# Patient Record
Sex: Female | Born: 1945 | Race: White | Hispanic: No | Smoking: Current every day smoker
Health system: Southern US, Community
[De-identification: ages and names within clinical notes are randomized; demographics above are authoritative.]

## PROBLEM LIST (undated history)

## (undated) DIAGNOSIS — M62838 Other muscle spasm: Secondary | ICD-10-CM

## (undated) DIAGNOSIS — M51369 Other intervertebral disc degeneration, lumbar region without mention of lumbar back pain or lower extremity pain: Secondary | ICD-10-CM

## (undated) DIAGNOSIS — E785 Hyperlipidemia, unspecified: Secondary | ICD-10-CM

## (undated) DIAGNOSIS — M25561 Pain in right knee: Secondary | ICD-10-CM

## (undated) DIAGNOSIS — R569 Unspecified convulsions: Secondary | ICD-10-CM

## (undated) DIAGNOSIS — F319 Bipolar disorder, unspecified: Secondary | ICD-10-CM

## (undated) DIAGNOSIS — M5030 Other cervical disc degeneration, unspecified cervical region: Secondary | ICD-10-CM

## (undated) DIAGNOSIS — J449 Chronic obstructive pulmonary disease, unspecified: Secondary | ICD-10-CM

## (undated) DIAGNOSIS — N3020 Other chronic cystitis without hematuria: Secondary | ICD-10-CM

## (undated) DIAGNOSIS — K623 Rectal prolapse: Secondary | ICD-10-CM

## (undated) DIAGNOSIS — M797 Fibromyalgia: Secondary | ICD-10-CM

## (undated) DIAGNOSIS — Z720 Tobacco use: Secondary | ICD-10-CM

## (undated) DIAGNOSIS — C4490 Unspecified malignant neoplasm of skin, unspecified: Secondary | ICD-10-CM

## (undated) DIAGNOSIS — N3946 Mixed incontinence: Secondary | ICD-10-CM

## (undated) DIAGNOSIS — F419 Anxiety disorder, unspecified: Secondary | ICD-10-CM

## (undated) DIAGNOSIS — E669 Obesity, unspecified: Secondary | ICD-10-CM

## (undated) DIAGNOSIS — E119 Type 2 diabetes mellitus without complications: Secondary | ICD-10-CM

## (undated) DIAGNOSIS — L981 Factitial dermatitis: Secondary | ICD-10-CM

## (undated) DIAGNOSIS — K3184 Gastroparesis: Secondary | ICD-10-CM

## (undated) DIAGNOSIS — M5136 Other intervertebral disc degeneration, lumbar region: Secondary | ICD-10-CM

## (undated) DIAGNOSIS — I2510 Atherosclerotic heart disease of native coronary artery without angina pectoris: Secondary | ICD-10-CM

## (undated) DIAGNOSIS — G8929 Other chronic pain: Secondary | ICD-10-CM

## (undated) DIAGNOSIS — I10 Essential (primary) hypertension: Secondary | ICD-10-CM

## (undated) DIAGNOSIS — K5750 Diverticulosis of both small and large intestine without perforation or abscess without bleeding: Secondary | ICD-10-CM

## (undated) DIAGNOSIS — K219 Gastro-esophageal reflux disease without esophagitis: Secondary | ICD-10-CM

## (undated) DIAGNOSIS — D72829 Elevated white blood cell count, unspecified: Secondary | ICD-10-CM

## (undated) HISTORY — PX: ABDOMINAL HYSTERECTOMY: SHX81

## (undated) HISTORY — DX: Pain in right knee: M25.561

## (undated) HISTORY — DX: Anxiety disorder, unspecified: F41.9

## (undated) HISTORY — DX: Tobacco use: Z72.0

## (undated) HISTORY — DX: Essential (primary) hypertension: I10

## (undated) HISTORY — PX: APPENDECTOMY: SHX54

## (undated) HISTORY — DX: Unspecified malignant neoplasm of skin, unspecified: C44.90

## (undated) HISTORY — DX: Factitial dermatitis: L98.1

## (undated) HISTORY — DX: Gastro-esophageal reflux disease without esophagitis: K21.9

## (undated) HISTORY — PX: EYE SURGERY: SHX253

## (undated) HISTORY — DX: Rectal prolapse: K62.3

## (undated) HISTORY — DX: Unspecified convulsions: R56.9

## (undated) HISTORY — PX: ANTERIOR CERVICAL DECOMP/DISCECTOMY FUSION: SHX1161

## (undated) HISTORY — PX: OTHER SURGICAL HISTORY: SHX169

## (undated) HISTORY — DX: Other chronic cystitis without hematuria: N30.20

## (undated) HISTORY — DX: Atherosclerotic heart disease of native coronary artery without angina pectoris: I25.10

## (undated) HISTORY — PX: CHOLECYSTECTOMY: SHX55

## (undated) HISTORY — PX: BLADDER SURGERY: SHX569

## (undated) HISTORY — DX: Other muscle spasm: M62.838

## (undated) HISTORY — PX: RECTOCELE REPAIR: SHX761

## (undated) HISTORY — DX: Gastroparesis: K31.84

## (undated) HISTORY — DX: Other intervertebral disc degeneration, lumbar region: M51.36

## (undated) HISTORY — DX: Fibromyalgia: M79.7

## (undated) HISTORY — PX: TUBAL LIGATION: SHX77

## (undated) HISTORY — DX: Elevated white blood cell count, unspecified: D72.829

## (undated) HISTORY — DX: Chronic obstructive pulmonary disease, unspecified: J44.9

## (undated) HISTORY — DX: Diverticulosis of both small and large intestine without perforation or abscess without bleeding: K57.50

## (undated) HISTORY — PX: CARDIOVASCULAR STRESS TEST: SHX262

## (undated) HISTORY — DX: Obesity, unspecified: E66.9

## (undated) HISTORY — DX: Hyperlipidemia, unspecified: E78.5

## (undated) HISTORY — DX: Other chronic pain: G89.29

## (undated) HISTORY — DX: Type 2 diabetes mellitus without complications: E11.9

## (undated) HISTORY — DX: Bipolar disorder, unspecified: F31.9

## (undated) HISTORY — DX: Other cervical disc degeneration, unspecified cervical region: M50.30

## (undated) HISTORY — DX: Mixed incontinence: N39.46

## (undated) HISTORY — DX: Other intervertebral disc degeneration, lumbar region without mention of lumbar back pain or lower extremity pain: M51.369

---

## 1998-10-31 ENCOUNTER — Other Ambulatory Visit: Admission: RE | Admit: 1998-10-31 | Discharge: 1998-10-31 | Payer: Self-pay | Admitting: Gastroenterology

## 1998-11-29 ENCOUNTER — Encounter: Payer: Self-pay | Admitting: Gastroenterology

## 1998-11-29 ENCOUNTER — Ambulatory Visit (HOSPITAL_COMMUNITY): Admission: RE | Admit: 1998-11-29 | Discharge: 1998-11-29 | Payer: Self-pay | Admitting: Gastroenterology

## 2001-04-06 ENCOUNTER — Encounter: Payer: Self-pay | Admitting: Internal Medicine

## 2001-04-06 ENCOUNTER — Ambulatory Visit (HOSPITAL_COMMUNITY): Admission: RE | Admit: 2001-04-06 | Discharge: 2001-04-06 | Payer: Self-pay | Admitting: Internal Medicine

## 2001-05-22 ENCOUNTER — Ambulatory Visit (HOSPITAL_COMMUNITY): Admission: RE | Admit: 2001-05-22 | Discharge: 2001-05-22 | Payer: Self-pay | Admitting: Cardiology

## 2001-05-24 ENCOUNTER — Emergency Department (HOSPITAL_COMMUNITY): Admission: EM | Admit: 2001-05-24 | Discharge: 2001-05-25 | Payer: Self-pay | Admitting: *Deleted

## 2001-06-08 ENCOUNTER — Encounter: Payer: Self-pay | Admitting: Otolaryngology

## 2001-06-08 ENCOUNTER — Ambulatory Visit (HOSPITAL_COMMUNITY): Admission: RE | Admit: 2001-06-08 | Discharge: 2001-06-08 | Payer: Self-pay | Admitting: Otolaryngology

## 2001-07-24 ENCOUNTER — Emergency Department (HOSPITAL_COMMUNITY): Admission: EM | Admit: 2001-07-24 | Discharge: 2001-07-24 | Payer: Self-pay | Admitting: Emergency Medicine

## 2002-05-25 ENCOUNTER — Encounter: Payer: Self-pay | Admitting: *Deleted

## 2002-05-25 ENCOUNTER — Ambulatory Visit (HOSPITAL_COMMUNITY): Admission: RE | Admit: 2002-05-25 | Discharge: 2002-05-25 | Payer: Self-pay | Admitting: *Deleted

## 2002-06-14 ENCOUNTER — Encounter: Payer: Self-pay | Admitting: Otolaryngology

## 2002-06-14 ENCOUNTER — Ambulatory Visit (HOSPITAL_COMMUNITY): Admission: RE | Admit: 2002-06-14 | Discharge: 2002-06-14 | Payer: Self-pay | Admitting: Otolaryngology

## 2002-09-02 HISTORY — PX: COLONOSCOPY W/ BIOPSIES: SHX1374

## 2002-09-06 ENCOUNTER — Other Ambulatory Visit: Admission: RE | Admit: 2002-09-06 | Discharge: 2002-09-06 | Payer: Self-pay | Admitting: Obstetrics and Gynecology

## 2002-09-09 ENCOUNTER — Encounter: Payer: Self-pay | Admitting: Gastroenterology

## 2002-10-13 ENCOUNTER — Encounter: Payer: Self-pay | Admitting: Internal Medicine

## 2002-10-13 ENCOUNTER — Ambulatory Visit (HOSPITAL_COMMUNITY): Admission: RE | Admit: 2002-10-13 | Discharge: 2002-10-13 | Payer: Self-pay | Admitting: Internal Medicine

## 2002-10-20 ENCOUNTER — Ambulatory Visit (HOSPITAL_COMMUNITY): Admission: RE | Admit: 2002-10-20 | Discharge: 2002-10-20 | Payer: Self-pay | Admitting: Internal Medicine

## 2002-10-20 ENCOUNTER — Encounter: Payer: Self-pay | Admitting: Internal Medicine

## 2003-01-17 ENCOUNTER — Emergency Department (HOSPITAL_COMMUNITY): Admission: EM | Admit: 2003-01-17 | Discharge: 2003-01-17 | Payer: Self-pay | Admitting: Emergency Medicine

## 2003-05-12 ENCOUNTER — Ambulatory Visit (HOSPITAL_COMMUNITY): Admission: RE | Admit: 2003-05-12 | Discharge: 2003-05-12 | Payer: Self-pay | Admitting: Internal Medicine

## 2003-05-12 ENCOUNTER — Encounter: Payer: Self-pay | Admitting: Internal Medicine

## 2003-06-01 ENCOUNTER — Inpatient Hospital Stay (HOSPITAL_COMMUNITY): Admission: EM | Admit: 2003-06-01 | Discharge: 2003-06-05 | Payer: Self-pay | Admitting: Psychiatry

## 2003-06-13 ENCOUNTER — Emergency Department (HOSPITAL_COMMUNITY): Admission: EM | Admit: 2003-06-13 | Discharge: 2003-06-13 | Payer: Self-pay | Admitting: Emergency Medicine

## 2003-06-30 ENCOUNTER — Emergency Department (HOSPITAL_COMMUNITY): Admission: EM | Admit: 2003-06-30 | Discharge: 2003-06-30 | Payer: Self-pay | Admitting: Emergency Medicine

## 2004-01-05 ENCOUNTER — Emergency Department (HOSPITAL_COMMUNITY): Admission: EM | Admit: 2004-01-05 | Discharge: 2004-01-05 | Payer: Self-pay | Admitting: Emergency Medicine

## 2004-01-20 ENCOUNTER — Emergency Department (HOSPITAL_COMMUNITY): Admission: EM | Admit: 2004-01-20 | Discharge: 2004-01-21 | Payer: Self-pay | Admitting: Emergency Medicine

## 2004-08-21 ENCOUNTER — Ambulatory Visit: Payer: Self-pay | Admitting: Psychiatry

## 2004-08-21 ENCOUNTER — Inpatient Hospital Stay (HOSPITAL_COMMUNITY): Admission: EM | Admit: 2004-08-21 | Discharge: 2004-08-31 | Payer: Self-pay | Admitting: Psychiatry

## 2004-09-07 ENCOUNTER — Ambulatory Visit: Payer: Self-pay | Admitting: Family Medicine

## 2004-09-10 ENCOUNTER — Ambulatory Visit: Payer: Self-pay | Admitting: Family Medicine

## 2004-09-11 ENCOUNTER — Ambulatory Visit: Payer: Self-pay | Admitting: Family Medicine

## 2004-09-21 ENCOUNTER — Ambulatory Visit: Payer: Self-pay | Admitting: Family Medicine

## 2004-10-03 ENCOUNTER — Ambulatory Visit: Payer: Self-pay | Admitting: Family Medicine

## 2004-10-04 ENCOUNTER — Ambulatory Visit: Payer: Self-pay | Admitting: Family Medicine

## 2004-10-12 ENCOUNTER — Ambulatory Visit: Payer: Self-pay | Admitting: Family Medicine

## 2004-10-28 ENCOUNTER — Emergency Department (HOSPITAL_COMMUNITY): Admission: EM | Admit: 2004-10-28 | Discharge: 2004-10-28 | Payer: Self-pay | Admitting: Emergency Medicine

## 2004-11-07 ENCOUNTER — Emergency Department (HOSPITAL_COMMUNITY): Admission: EM | Admit: 2004-11-07 | Discharge: 2004-11-07 | Payer: Self-pay | Admitting: Family Medicine

## 2004-11-16 ENCOUNTER — Ambulatory Visit: Payer: Self-pay | Admitting: Family Medicine

## 2004-12-04 ENCOUNTER — Encounter: Admission: RE | Admit: 2004-12-04 | Discharge: 2004-12-04 | Payer: Self-pay | Admitting: Family Medicine

## 2005-01-19 ENCOUNTER — Inpatient Hospital Stay (HOSPITAL_COMMUNITY): Admission: EM | Admit: 2005-01-19 | Discharge: 2005-01-22 | Payer: Self-pay | Admitting: Emergency Medicine

## 2005-01-20 ENCOUNTER — Ambulatory Visit: Payer: Self-pay | Admitting: Gastroenterology

## 2005-02-03 ENCOUNTER — Emergency Department (HOSPITAL_COMMUNITY): Admission: EM | Admit: 2005-02-03 | Discharge: 2005-02-03 | Payer: Self-pay | Admitting: Emergency Medicine

## 2005-03-25 ENCOUNTER — Emergency Department (HOSPITAL_COMMUNITY): Admission: EM | Admit: 2005-03-25 | Discharge: 2005-03-26 | Payer: Self-pay | Admitting: Emergency Medicine

## 2005-04-05 ENCOUNTER — Ambulatory Visit: Payer: Self-pay | Admitting: Gastroenterology

## 2005-05-31 ENCOUNTER — Other Ambulatory Visit: Admission: RE | Admit: 2005-05-31 | Discharge: 2005-05-31 | Payer: Self-pay | Admitting: Obstetrics and Gynecology

## 2005-06-10 ENCOUNTER — Inpatient Hospital Stay (HOSPITAL_COMMUNITY): Admission: RE | Admit: 2005-06-10 | Discharge: 2005-06-12 | Payer: Self-pay | Admitting: Specialist

## 2005-08-07 ENCOUNTER — Ambulatory Visit: Payer: Self-pay | Admitting: Internal Medicine

## 2005-08-21 ENCOUNTER — Ambulatory Visit: Payer: Self-pay | Admitting: Internal Medicine

## 2005-08-30 ENCOUNTER — Encounter: Payer: Self-pay | Admitting: Internal Medicine

## 2005-10-02 ENCOUNTER — Ambulatory Visit: Payer: Self-pay | Admitting: Internal Medicine

## 2005-10-30 ENCOUNTER — Ambulatory Visit: Payer: Self-pay | Admitting: Internal Medicine

## 2005-11-06 ENCOUNTER — Ambulatory Visit: Payer: Self-pay | Admitting: Internal Medicine

## 2005-11-29 ENCOUNTER — Ambulatory Visit: Payer: Self-pay | Admitting: Internal Medicine

## 2005-12-30 ENCOUNTER — Ambulatory Visit: Payer: Self-pay | Admitting: Internal Medicine

## 2006-01-10 ENCOUNTER — Encounter: Admission: RE | Admit: 2006-01-10 | Discharge: 2006-01-10 | Payer: Self-pay | Admitting: Internal Medicine

## 2006-03-04 ENCOUNTER — Encounter: Admission: RE | Admit: 2006-03-04 | Discharge: 2006-03-04 | Payer: Self-pay | Admitting: Obstetrics and Gynecology

## 2006-05-19 ENCOUNTER — Ambulatory Visit: Payer: Self-pay | Admitting: Internal Medicine

## 2006-06-19 ENCOUNTER — Ambulatory Visit: Payer: Self-pay | Admitting: Internal Medicine

## 2006-10-06 ENCOUNTER — Ambulatory Visit: Payer: Self-pay | Admitting: Internal Medicine

## 2006-10-27 ENCOUNTER — Ambulatory Visit: Payer: Self-pay | Admitting: Internal Medicine

## 2006-10-31 ENCOUNTER — Ambulatory Visit: Payer: Self-pay | Admitting: Internal Medicine

## 2006-10-31 LAB — CONVERTED CEMR LAB
ALT: 15 units/L (ref 0–40)
Direct LDL: 133.1 mg/dL
Triglycerides: 214 mg/dL (ref 0–149)

## 2006-11-05 ENCOUNTER — Ambulatory Visit: Payer: Self-pay | Admitting: Cardiovascular Disease

## 2006-11-13 ENCOUNTER — Ambulatory Visit: Payer: Self-pay

## 2006-12-01 ENCOUNTER — Ambulatory Visit: Payer: Self-pay | Admitting: Internal Medicine

## 2006-12-24 ENCOUNTER — Ambulatory Visit: Payer: Self-pay | Admitting: Cardiovascular Disease

## 2007-01-08 ENCOUNTER — Ambulatory Visit: Payer: Self-pay | Admitting: Internal Medicine

## 2007-01-12 ENCOUNTER — Encounter: Admission: RE | Admit: 2007-01-12 | Discharge: 2007-01-12 | Payer: Self-pay | Admitting: Internal Medicine

## 2007-02-27 ENCOUNTER — Ambulatory Visit: Payer: Self-pay | Admitting: Internal Medicine

## 2007-04-03 ENCOUNTER — Encounter: Payer: Self-pay | Admitting: Internal Medicine

## 2007-04-03 DIAGNOSIS — F17200 Nicotine dependence, unspecified, uncomplicated: Secondary | ICD-10-CM | POA: Insufficient documentation

## 2007-04-03 DIAGNOSIS — R569 Unspecified convulsions: Secondary | ICD-10-CM

## 2007-04-03 DIAGNOSIS — I10 Essential (primary) hypertension: Secondary | ICD-10-CM | POA: Insufficient documentation

## 2007-04-03 DIAGNOSIS — J309 Allergic rhinitis, unspecified: Secondary | ICD-10-CM | POA: Insufficient documentation

## 2007-04-03 DIAGNOSIS — F329 Major depressive disorder, single episode, unspecified: Secondary | ICD-10-CM | POA: Insufficient documentation

## 2007-04-03 DIAGNOSIS — Z85828 Personal history of other malignant neoplasm of skin: Secondary | ICD-10-CM

## 2007-04-03 DIAGNOSIS — J45909 Unspecified asthma, uncomplicated: Secondary | ICD-10-CM | POA: Insufficient documentation

## 2007-04-03 DIAGNOSIS — K219 Gastro-esophageal reflux disease without esophagitis: Secondary | ICD-10-CM | POA: Insufficient documentation

## 2007-04-14 ENCOUNTER — Ambulatory Visit: Payer: Self-pay | Admitting: Internal Medicine

## 2007-04-24 ENCOUNTER — Ambulatory Visit: Payer: Self-pay | Admitting: Internal Medicine

## 2007-05-11 ENCOUNTER — Ambulatory Visit: Payer: Self-pay | Admitting: Internal Medicine

## 2007-05-11 LAB — CONVERTED CEMR LAB
Crystals: NEGATIVE
Hemoglobin, Urine: NEGATIVE
Ketones, ur: NEGATIVE mg/dL
Specific Gravity, Urine: <=1.005 (ref 1.000–1.03)
Total Protein, Urine: NEGATIVE mg/dL
pH: 7 (ref 5.0–8.0)

## 2007-05-12 ENCOUNTER — Encounter: Payer: Self-pay | Admitting: Internal Medicine

## 2007-05-12 LAB — CONVERTED CEMR LAB
Amphetamine Screen, Ur: NEGATIVE
Barbiturate Quant, Ur: NEGATIVE
Benzodiazepines.: NEGATIVE
Methadone: NEGATIVE
Propoxyphene: NEGATIVE

## 2007-06-08 ENCOUNTER — Ambulatory Visit: Payer: Self-pay | Admitting: Gastroenterology

## 2007-06-08 LAB — CONVERTED CEMR LAB
AST: 19 units/L (ref 0–37)
Amylase: 53 units/L (ref 27–131)
Basophils Absolute: 0.1 10*3/uL (ref 0.0–0.1)
Bilirubin, Direct: 0.1 mg/dL (ref 0.0–0.3)
Eosinophils Absolute: 0.2 10*3/uL (ref 0.0–0.6)
HCT: 35.5 % — ABNORMAL LOW (ref 36.0–46.0)
Hemoglobin: 12.1 g/dL (ref 12.0–15.0)
Lymphocytes Relative: 34.2 % (ref 12.0–46.0)
MCHC: 34.2 g/dL (ref 30.0–36.0)
MCV: 82.3 fL (ref 78.0–100.0)
Monocytes Absolute: 0.7 10*3/uL (ref 0.2–0.7)
Neutrophils Relative %: 57.2 % (ref 43.0–77.0)

## 2007-07-01 ENCOUNTER — Telehealth (INDEPENDENT_AMBULATORY_CARE_PROVIDER_SITE_OTHER): Payer: Self-pay | Admitting: *Deleted

## 2007-07-02 ENCOUNTER — Emergency Department (HOSPITAL_COMMUNITY): Admission: EM | Admit: 2007-07-02 | Discharge: 2007-07-02 | Payer: Self-pay | Admitting: Emergency Medicine

## 2007-07-06 ENCOUNTER — Ambulatory Visit: Payer: Self-pay | Admitting: Internal Medicine

## 2007-07-06 DIAGNOSIS — K589 Irritable bowel syndrome without diarrhea: Secondary | ICD-10-CM

## 2007-07-06 DIAGNOSIS — E871 Hypo-osmolality and hyponatremia: Secondary | ICD-10-CM | POA: Insufficient documentation

## 2007-07-06 DIAGNOSIS — IMO0001 Reserved for inherently not codable concepts without codable children: Secondary | ICD-10-CM

## 2007-07-07 ENCOUNTER — Telehealth: Payer: Self-pay | Admitting: Internal Medicine

## 2007-07-31 ENCOUNTER — Encounter: Payer: Self-pay | Admitting: Internal Medicine

## 2007-08-03 HISTORY — PX: CARDIAC CATHETERIZATION: SHX172

## 2007-08-07 ENCOUNTER — Ambulatory Visit: Payer: Self-pay | Admitting: Internal Medicine

## 2007-08-08 LAB — CONVERTED CEMR LAB
BUN: 4 mg/dL — ABNORMAL LOW (ref 6–23)
Calcium: 9.5 mg/dL (ref 8.4–10.5)
GFR calc Af Amer: 132 mL/min
GFR calc non Af Amer: 109 mL/min
Glucose, Bld: 92 mg/dL (ref 70–99)

## 2007-08-12 ENCOUNTER — Ambulatory Visit: Payer: Self-pay | Admitting: Pediatrics

## 2007-08-13 ENCOUNTER — Telehealth: Payer: Self-pay | Admitting: Internal Medicine

## 2007-08-13 DIAGNOSIS — M545 Low back pain: Secondary | ICD-10-CM

## 2007-08-14 ENCOUNTER — Encounter
Admission: RE | Admit: 2007-08-14 | Discharge: 2007-09-01 | Payer: Self-pay | Admitting: Physical Medicine & Rehabilitation

## 2007-08-14 ENCOUNTER — Ambulatory Visit: Payer: Self-pay | Admitting: Physical Medicine & Rehabilitation

## 2007-08-18 ENCOUNTER — Ambulatory Visit: Payer: Self-pay | Admitting: Cardiovascular Disease

## 2007-08-18 ENCOUNTER — Encounter
Admission: RE | Admit: 2007-08-18 | Discharge: 2007-08-18 | Payer: Self-pay | Admitting: Physical Medicine & Rehabilitation

## 2007-08-21 ENCOUNTER — Ambulatory Visit: Payer: Self-pay | Admitting: Cardiovascular Disease

## 2007-08-21 ENCOUNTER — Inpatient Hospital Stay (HOSPITAL_BASED_OUTPATIENT_CLINIC_OR_DEPARTMENT_OTHER): Admission: RE | Admit: 2007-08-21 | Discharge: 2007-08-21 | Payer: Self-pay | Admitting: Cardiovascular Disease

## 2007-08-21 ENCOUNTER — Encounter: Payer: Self-pay | Admitting: Internal Medicine

## 2007-08-26 ENCOUNTER — Encounter: Payer: Self-pay | Admitting: Internal Medicine

## 2007-08-31 ENCOUNTER — Telehealth: Payer: Self-pay | Admitting: Internal Medicine

## 2007-08-31 ENCOUNTER — Emergency Department (HOSPITAL_COMMUNITY): Admission: EM | Admit: 2007-08-31 | Discharge: 2007-08-31 | Payer: Self-pay | Admitting: Emergency Medicine

## 2007-09-01 ENCOUNTER — Ambulatory Visit: Payer: Self-pay | Admitting: Internal Medicine

## 2007-09-01 DIAGNOSIS — I2510 Atherosclerotic heart disease of native coronary artery without angina pectoris: Secondary | ICD-10-CM | POA: Insufficient documentation

## 2007-09-07 ENCOUNTER — Encounter: Payer: Self-pay | Admitting: Internal Medicine

## 2007-09-09 ENCOUNTER — Ambulatory Visit: Payer: Self-pay | Admitting: Cardiovascular Disease

## 2007-09-10 ENCOUNTER — Ambulatory Visit: Payer: Self-pay | Admitting: Internal Medicine

## 2007-09-13 ENCOUNTER — Encounter: Payer: Self-pay | Admitting: Internal Medicine

## 2007-09-23 ENCOUNTER — Ambulatory Visit: Payer: Self-pay | Admitting: Physical Medicine & Rehabilitation

## 2007-09-23 ENCOUNTER — Encounter
Admission: RE | Admit: 2007-09-23 | Discharge: 2007-12-22 | Payer: Self-pay | Admitting: Physical Medicine & Rehabilitation

## 2007-09-30 ENCOUNTER — Ambulatory Visit: Payer: Self-pay | Admitting: Internal Medicine

## 2007-10-02 ENCOUNTER — Encounter: Payer: Self-pay | Admitting: Internal Medicine

## 2007-10-09 ENCOUNTER — Ambulatory Visit: Payer: Self-pay | Admitting: Internal Medicine

## 2007-10-09 ENCOUNTER — Encounter
Admission: RE | Admit: 2007-10-09 | Discharge: 2007-10-09 | Payer: Self-pay | Admitting: Physical Medicine & Rehabilitation

## 2007-10-09 ENCOUNTER — Ambulatory Visit: Payer: Self-pay | Admitting: Physical Medicine & Rehabilitation

## 2007-10-09 LAB — CONVERTED CEMR LAB
Bilirubin Urine: NEGATIVE
Ketones, ur: NEGATIVE mg/dL
Total Protein, Urine: NEGATIVE mg/dL
Urine Glucose: NEGATIVE mg/dL
Urobilinogen, UA: 0.2 (ref 0.0–1.0)
pH: 6 (ref 5.0–8.0)

## 2007-10-12 ENCOUNTER — Telehealth: Payer: Self-pay | Admitting: Internal Medicine

## 2007-10-19 ENCOUNTER — Ambulatory Visit: Payer: Self-pay | Admitting: Cardiovascular Disease

## 2007-10-19 LAB — CONVERTED CEMR LAB
ALT: 17 units/L (ref 0–35)
AST: 17 units/L (ref 0–37)
Albumin: 3.4 g/dL — ABNORMAL LOW (ref 3.5–5.2)
Alkaline Phosphatase: 89 units/L (ref 39–117)
VLDL: 29 mg/dL (ref 0–40)

## 2007-11-03 ENCOUNTER — Encounter: Admission: RE | Admit: 2007-11-03 | Discharge: 2007-11-03 | Payer: Self-pay | Admitting: Obstetrics and Gynecology

## 2007-11-12 DIAGNOSIS — K209 Esophagitis, unspecified without bleeding: Secondary | ICD-10-CM | POA: Insufficient documentation

## 2007-11-12 DIAGNOSIS — K5730 Diverticulosis of large intestine without perforation or abscess without bleeding: Secondary | ICD-10-CM | POA: Insufficient documentation

## 2007-11-12 DIAGNOSIS — E785 Hyperlipidemia, unspecified: Secondary | ICD-10-CM

## 2007-11-12 DIAGNOSIS — F341 Dysthymic disorder: Secondary | ICD-10-CM | POA: Insufficient documentation

## 2007-11-12 DIAGNOSIS — R197 Diarrhea, unspecified: Secondary | ICD-10-CM | POA: Insufficient documentation

## 2007-11-12 DIAGNOSIS — N816 Rectocele: Secondary | ICD-10-CM

## 2007-11-12 DIAGNOSIS — M129 Arthropathy, unspecified: Secondary | ICD-10-CM | POA: Insufficient documentation

## 2007-11-12 DIAGNOSIS — E669 Obesity, unspecified: Secondary | ICD-10-CM

## 2007-11-12 DIAGNOSIS — J329 Chronic sinusitis, unspecified: Secondary | ICD-10-CM | POA: Insufficient documentation

## 2007-11-12 DIAGNOSIS — F319 Bipolar disorder, unspecified: Secondary | ICD-10-CM

## 2007-11-12 DIAGNOSIS — Z8719 Personal history of other diseases of the digestive system: Secondary | ICD-10-CM

## 2007-11-19 ENCOUNTER — Ambulatory Visit: Payer: Self-pay | Admitting: Physical Medicine & Rehabilitation

## 2007-11-23 ENCOUNTER — Emergency Department (HOSPITAL_COMMUNITY): Admission: EM | Admit: 2007-11-23 | Discharge: 2007-11-24 | Payer: Self-pay | Admitting: Emergency Medicine

## 2007-11-24 ENCOUNTER — Encounter: Payer: Self-pay | Admitting: Internal Medicine

## 2007-12-08 ENCOUNTER — Encounter: Payer: Self-pay | Admitting: Internal Medicine

## 2007-12-10 ENCOUNTER — Ambulatory Visit: Payer: Self-pay | Admitting: Cardiovascular Disease

## 2007-12-17 ENCOUNTER — Telehealth: Payer: Self-pay | Admitting: Internal Medicine

## 2007-12-18 ENCOUNTER — Ambulatory Visit: Payer: Self-pay | Admitting: Physical Medicine & Rehabilitation

## 2008-01-13 ENCOUNTER — Encounter
Admission: RE | Admit: 2008-01-13 | Discharge: 2008-04-12 | Payer: Self-pay | Admitting: Physical Medicine & Rehabilitation

## 2008-01-18 ENCOUNTER — Ambulatory Visit: Payer: Self-pay | Admitting: Physical Medicine & Rehabilitation

## 2008-02-10 ENCOUNTER — Encounter: Admission: RE | Admit: 2008-02-10 | Discharge: 2008-02-10 | Payer: Self-pay | Admitting: Obstetrics and Gynecology

## 2008-02-16 ENCOUNTER — Ambulatory Visit: Payer: Self-pay | Admitting: Physical Medicine & Rehabilitation

## 2008-03-02 ENCOUNTER — Ambulatory Visit: Payer: Self-pay | Admitting: Internal Medicine

## 2008-03-02 DIAGNOSIS — R21 Rash and other nonspecific skin eruption: Secondary | ICD-10-CM | POA: Insufficient documentation

## 2008-03-08 ENCOUNTER — Telehealth (INDEPENDENT_AMBULATORY_CARE_PROVIDER_SITE_OTHER): Payer: Self-pay | Admitting: *Deleted

## 2008-03-11 ENCOUNTER — Encounter: Payer: Self-pay | Admitting: Internal Medicine

## 2008-03-15 ENCOUNTER — Ambulatory Visit: Payer: Self-pay | Admitting: Physical Medicine & Rehabilitation

## 2008-03-25 ENCOUNTER — Telehealth (INDEPENDENT_AMBULATORY_CARE_PROVIDER_SITE_OTHER): Payer: Self-pay | Admitting: *Deleted

## 2008-03-26 ENCOUNTER — Telehealth: Payer: Self-pay | Admitting: Family Medicine

## 2008-04-13 ENCOUNTER — Encounter
Admission: RE | Admit: 2008-04-13 | Discharge: 2008-07-12 | Payer: Self-pay | Admitting: Physical Medicine & Rehabilitation

## 2008-04-15 ENCOUNTER — Telehealth: Payer: Self-pay | Admitting: Internal Medicine

## 2008-04-15 ENCOUNTER — Ambulatory Visit: Payer: Self-pay | Admitting: Physical Medicine & Rehabilitation

## 2008-04-18 ENCOUNTER — Ambulatory Visit: Payer: Self-pay | Admitting: Internal Medicine

## 2008-04-18 ENCOUNTER — Telehealth (INDEPENDENT_AMBULATORY_CARE_PROVIDER_SITE_OTHER): Payer: Self-pay | Admitting: *Deleted

## 2008-04-18 DIAGNOSIS — M79609 Pain in unspecified limb: Secondary | ICD-10-CM

## 2008-04-18 DIAGNOSIS — M5412 Radiculopathy, cervical region: Secondary | ICD-10-CM | POA: Insufficient documentation

## 2008-04-18 DIAGNOSIS — R42 Dizziness and giddiness: Secondary | ICD-10-CM

## 2008-04-20 ENCOUNTER — Ambulatory Visit: Payer: Self-pay

## 2008-04-20 ENCOUNTER — Encounter: Payer: Self-pay | Admitting: Internal Medicine

## 2008-04-21 ENCOUNTER — Encounter: Admission: RE | Admit: 2008-04-21 | Discharge: 2008-04-21 | Payer: Self-pay | Admitting: Internal Medicine

## 2008-05-01 ENCOUNTER — Encounter: Admission: RE | Admit: 2008-05-01 | Discharge: 2008-05-01 | Payer: Self-pay | Admitting: Specialist

## 2008-05-02 ENCOUNTER — Telehealth (INDEPENDENT_AMBULATORY_CARE_PROVIDER_SITE_OTHER): Payer: Self-pay | Admitting: *Deleted

## 2008-05-05 ENCOUNTER — Ambulatory Visit: Payer: Self-pay | Admitting: Physical Medicine & Rehabilitation

## 2008-05-11 ENCOUNTER — Encounter: Admission: RE | Admit: 2008-05-11 | Discharge: 2008-05-11 | Payer: Self-pay | Admitting: Specialist

## 2008-05-13 ENCOUNTER — Encounter: Payer: Self-pay | Admitting: Internal Medicine

## 2008-05-27 ENCOUNTER — Ambulatory Visit (HOSPITAL_COMMUNITY): Admission: RE | Admit: 2008-05-27 | Discharge: 2008-05-27 | Payer: Self-pay | Admitting: Specialist

## 2008-06-02 ENCOUNTER — Ambulatory Visit: Payer: Self-pay | Admitting: Physical Medicine & Rehabilitation

## 2008-06-10 ENCOUNTER — Ambulatory Visit: Payer: Self-pay | Admitting: Internal Medicine

## 2008-06-12 ENCOUNTER — Telehealth: Payer: Self-pay | Admitting: Family Medicine

## 2008-06-13 ENCOUNTER — Ambulatory Visit: Payer: Self-pay | Admitting: Internal Medicine

## 2008-06-21 ENCOUNTER — Telehealth (INDEPENDENT_AMBULATORY_CARE_PROVIDER_SITE_OTHER): Payer: Self-pay | Admitting: *Deleted

## 2008-06-22 ENCOUNTER — Ambulatory Visit: Payer: Self-pay | Admitting: Internal Medicine

## 2008-06-22 DIAGNOSIS — J45901 Unspecified asthma with (acute) exacerbation: Secondary | ICD-10-CM | POA: Insufficient documentation

## 2008-06-29 ENCOUNTER — Telehealth: Payer: Self-pay | Admitting: Internal Medicine

## 2008-07-04 ENCOUNTER — Ambulatory Visit: Payer: Self-pay | Admitting: Internal Medicine

## 2008-07-05 ENCOUNTER — Ambulatory Visit: Payer: Self-pay | Admitting: Cardiovascular Disease

## 2008-07-07 ENCOUNTER — Ambulatory Visit: Payer: Self-pay | Admitting: Internal Medicine

## 2008-07-07 DIAGNOSIS — R0989 Other specified symptoms and signs involving the circulatory and respiratory systems: Secondary | ICD-10-CM | POA: Insufficient documentation

## 2008-07-07 DIAGNOSIS — R0609 Other forms of dyspnea: Secondary | ICD-10-CM

## 2008-07-11 ENCOUNTER — Encounter: Payer: Self-pay | Admitting: Internal Medicine

## 2008-07-20 ENCOUNTER — Ambulatory Visit: Payer: Self-pay | Admitting: Internal Medicine

## 2008-07-20 ENCOUNTER — Encounter
Admission: RE | Admit: 2008-07-20 | Discharge: 2008-10-18 | Payer: Self-pay | Admitting: Physical Medicine & Rehabilitation

## 2008-07-21 ENCOUNTER — Ambulatory Visit (HOSPITAL_COMMUNITY)
Admission: RE | Admit: 2008-07-21 | Discharge: 2008-07-21 | Payer: Self-pay | Admitting: Physical Medicine & Rehabilitation

## 2008-07-21 ENCOUNTER — Ambulatory Visit: Payer: Self-pay | Admitting: Physical Medicine & Rehabilitation

## 2008-08-05 ENCOUNTER — Encounter: Admission: RE | Admit: 2008-08-05 | Discharge: 2008-08-05 | Payer: Self-pay | Admitting: Neurology

## 2008-08-09 ENCOUNTER — Ambulatory Visit: Payer: Self-pay | Admitting: Internal Medicine

## 2008-08-16 ENCOUNTER — Telehealth (INDEPENDENT_AMBULATORY_CARE_PROVIDER_SITE_OTHER): Payer: Self-pay | Admitting: *Deleted

## 2008-08-17 ENCOUNTER — Ambulatory Visit: Payer: Self-pay | Admitting: Internal Medicine

## 2008-08-17 DIAGNOSIS — E119 Type 2 diabetes mellitus without complications: Secondary | ICD-10-CM

## 2008-08-17 DIAGNOSIS — E1149 Type 2 diabetes mellitus with other diabetic neurological complication: Secondary | ICD-10-CM

## 2008-08-17 HISTORY — DX: Type 2 diabetes mellitus without complications: E11.9

## 2008-08-17 LAB — CONVERTED CEMR LAB
AST: 19 units/L (ref 0–37)
Albumin: 3.5 g/dL (ref 3.5–5.2)
Alkaline Phosphatase: 81 units/L (ref 39–117)
BUN: 6 mg/dL (ref 6–23)
Bilirubin Urine: NEGATIVE
Bilirubin, Direct: 0.1 mg/dL (ref 0.0–0.3)
Chloride: 107 meq/L (ref 96–112)
Eosinophils Absolute: 0.2 10*3/uL (ref 0.0–0.7)
Eosinophils Relative: 2 % (ref 0.0–5.0)
GFR calc Af Amer: 110 mL/min
GFR calc non Af Amer: 91 mL/min
Hemoglobin, Urine: NEGATIVE
LDL Cholesterol: 75 mg/dL (ref 0–99)
MCV: 84.4 fL (ref 78.0–100.0)
Monocytes Relative: 5.1 % (ref 3.0–12.0)
Neutrophils Relative %: 53.1 % (ref 43.0–77.0)
Nitrite: NEGATIVE
Platelets: 302 10*3/uL (ref 150–400)
Potassium: 4 meq/L (ref 3.5–5.1)
RDW: 14.2 % (ref 11.5–14.6)
Sodium: 141 meq/L (ref 135–145)
TSH: 1.24 microintl units/mL (ref 0.35–5.50)
Urobilinogen, UA: 0.2 (ref 0.0–1.0)
VLDL: 26 mg/dL (ref 0–40)
WBC: 11.6 10*3/uL — ABNORMAL HIGH (ref 4.5–10.5)

## 2008-08-19 LAB — CONVERTED CEMR LAB: Vit D, 1,25-Dihydroxy: 34 (ref 30–89)

## 2008-08-23 ENCOUNTER — Encounter: Payer: Self-pay | Admitting: Internal Medicine

## 2008-09-07 ENCOUNTER — Telehealth (INDEPENDENT_AMBULATORY_CARE_PROVIDER_SITE_OTHER): Payer: Self-pay | Admitting: *Deleted

## 2008-09-14 ENCOUNTER — Encounter: Payer: Self-pay | Admitting: Internal Medicine

## 2008-09-15 ENCOUNTER — Ambulatory Visit: Payer: Self-pay | Admitting: Physical Medicine & Rehabilitation

## 2008-10-03 ENCOUNTER — Ambulatory Visit: Payer: Self-pay | Admitting: Internal Medicine

## 2008-10-04 ENCOUNTER — Telehealth (INDEPENDENT_AMBULATORY_CARE_PROVIDER_SITE_OTHER): Payer: Self-pay | Admitting: *Deleted

## 2008-10-14 ENCOUNTER — Ambulatory Visit: Payer: Self-pay | Admitting: Physical Medicine & Rehabilitation

## 2008-10-26 ENCOUNTER — Encounter
Admission: RE | Admit: 2008-10-26 | Discharge: 2009-01-24 | Payer: Self-pay | Admitting: Physical Medicine & Rehabilitation

## 2008-10-27 ENCOUNTER — Telehealth (INDEPENDENT_AMBULATORY_CARE_PROVIDER_SITE_OTHER): Payer: Self-pay | Admitting: *Deleted

## 2008-10-27 ENCOUNTER — Emergency Department (HOSPITAL_COMMUNITY): Admission: EM | Admit: 2008-10-27 | Discharge: 2008-10-27 | Payer: Self-pay | Admitting: Emergency Medicine

## 2008-10-27 DIAGNOSIS — L259 Unspecified contact dermatitis, unspecified cause: Secondary | ICD-10-CM

## 2008-10-28 ENCOUNTER — Encounter: Payer: Self-pay | Admitting: Internal Medicine

## 2008-11-08 ENCOUNTER — Telehealth: Payer: Self-pay | Admitting: Internal Medicine

## 2008-11-08 ENCOUNTER — Ambulatory Visit: Payer: Self-pay | Admitting: Endocrinology

## 2008-11-09 ENCOUNTER — Telehealth (INDEPENDENT_AMBULATORY_CARE_PROVIDER_SITE_OTHER): Payer: Self-pay | Admitting: *Deleted

## 2008-11-09 ENCOUNTER — Ambulatory Visit (HOSPITAL_COMMUNITY): Admission: RE | Admit: 2008-11-09 | Discharge: 2008-11-09 | Payer: Self-pay | Admitting: Specialist

## 2008-11-11 ENCOUNTER — Encounter
Admission: RE | Admit: 2008-11-11 | Discharge: 2009-01-02 | Payer: Self-pay | Admitting: Physical Medicine & Rehabilitation

## 2008-11-11 ENCOUNTER — Ambulatory Visit: Payer: Self-pay | Admitting: Physical Medicine & Rehabilitation

## 2008-11-14 ENCOUNTER — Encounter: Payer: Self-pay | Admitting: Internal Medicine

## 2008-11-23 ENCOUNTER — Encounter: Payer: Self-pay | Admitting: Endocrinology

## 2008-11-29 ENCOUNTER — Telehealth (INDEPENDENT_AMBULATORY_CARE_PROVIDER_SITE_OTHER): Payer: Self-pay | Admitting: *Deleted

## 2008-12-04 ENCOUNTER — Encounter: Payer: Self-pay | Admitting: Internal Medicine

## 2008-12-08 ENCOUNTER — Ambulatory Visit: Payer: Self-pay | Admitting: Endocrinology

## 2008-12-08 ENCOUNTER — Ambulatory Visit: Payer: Self-pay | Admitting: Physical Medicine & Rehabilitation

## 2008-12-09 ENCOUNTER — Encounter: Payer: Self-pay | Admitting: Internal Medicine

## 2008-12-13 ENCOUNTER — Telehealth (INDEPENDENT_AMBULATORY_CARE_PROVIDER_SITE_OTHER): Payer: Self-pay

## 2008-12-14 ENCOUNTER — Encounter: Payer: Self-pay | Admitting: Internal Medicine

## 2008-12-14 ENCOUNTER — Encounter: Payer: Self-pay | Admitting: Cardiology

## 2008-12-14 ENCOUNTER — Ambulatory Visit: Payer: Self-pay

## 2008-12-16 ENCOUNTER — Telehealth: Payer: Self-pay | Admitting: Cardiology

## 2008-12-21 ENCOUNTER — Encounter: Payer: Self-pay | Admitting: Endocrinology

## 2008-12-22 ENCOUNTER — Inpatient Hospital Stay (HOSPITAL_COMMUNITY): Admission: RE | Admit: 2008-12-22 | Discharge: 2008-12-24 | Payer: Self-pay | Admitting: Specialist

## 2009-01-10 ENCOUNTER — Encounter: Payer: Self-pay | Admitting: Internal Medicine

## 2009-01-31 ENCOUNTER — Telehealth (INDEPENDENT_AMBULATORY_CARE_PROVIDER_SITE_OTHER): Payer: Self-pay | Admitting: *Deleted

## 2009-02-23 ENCOUNTER — Ambulatory Visit: Payer: Self-pay | Admitting: Internal Medicine

## 2009-02-23 DIAGNOSIS — S8010XA Contusion of unspecified lower leg, initial encounter: Secondary | ICD-10-CM

## 2009-02-24 ENCOUNTER — Telehealth: Payer: Self-pay | Admitting: Internal Medicine

## 2009-03-09 ENCOUNTER — Encounter: Admission: RE | Admit: 2009-03-09 | Discharge: 2009-03-09 | Payer: Self-pay | Admitting: Internal Medicine

## 2009-03-10 ENCOUNTER — Encounter: Payer: Self-pay | Admitting: Internal Medicine

## 2009-03-13 ENCOUNTER — Telehealth: Payer: Self-pay | Admitting: Gastroenterology

## 2009-03-15 ENCOUNTER — Encounter: Admission: RE | Admit: 2009-03-15 | Discharge: 2009-03-15 | Payer: Self-pay | Admitting: Internal Medicine

## 2009-03-17 ENCOUNTER — Ambulatory Visit: Payer: Self-pay | Admitting: Gastroenterology

## 2009-03-17 DIAGNOSIS — R109 Unspecified abdominal pain: Secondary | ICD-10-CM

## 2009-03-20 ENCOUNTER — Telehealth: Payer: Self-pay | Admitting: Physician Assistant

## 2009-03-21 ENCOUNTER — Encounter: Payer: Self-pay | Admitting: Physician Assistant

## 2009-03-22 ENCOUNTER — Encounter: Payer: Self-pay | Admitting: Physician Assistant

## 2009-03-22 LAB — CONVERTED CEMR LAB
Basophils Absolute: 0.1 10*3/uL (ref 0.0–0.1)
Basophils Relative: 0.6 % (ref 0.0–3.0)
CO2: 30 meq/L (ref 19–32)
Calcium: 9.5 mg/dL (ref 8.4–10.5)
Chloride: 104 meq/L (ref 96–112)
Creatinine, Ser: 1 mg/dL (ref 0.4–1.2)
Eosinophils Relative: 0.9 % (ref 0.0–5.0)
Glucose, Bld: 95 mg/dL (ref 70–99)
HCT: 43.2 % (ref 36.0–46.0)
Hemoglobin: 14.6 g/dL (ref 12.0–15.0)
Lymphocytes Relative: 26.6 % (ref 12.0–46.0)
Lymphs Abs: 4 10*3/uL (ref 0.7–4.0)
Monocytes Relative: 4.8 % (ref 3.0–12.0)
Neutro Abs: 10 10*3/uL — ABNORMAL HIGH (ref 1.4–7.7)
RBC: 5.11 M/uL (ref 3.87–5.11)
RDW: 14.9 % — ABNORMAL HIGH (ref 11.5–14.6)
WBC: 14.9 10*3/uL — ABNORMAL HIGH (ref 4.5–10.5)

## 2009-04-12 ENCOUNTER — Ambulatory Visit: Payer: Self-pay | Admitting: Gastroenterology

## 2009-04-18 ENCOUNTER — Telehealth: Payer: Self-pay | Admitting: Internal Medicine

## 2009-04-19 ENCOUNTER — Ambulatory Visit: Payer: Self-pay | Admitting: Gastroenterology

## 2009-04-25 ENCOUNTER — Telehealth: Payer: Self-pay | Admitting: Internal Medicine

## 2009-04-26 ENCOUNTER — Encounter: Admission: RE | Admit: 2009-04-26 | Discharge: 2009-07-25 | Payer: Self-pay | Admitting: Specialist

## 2009-05-02 ENCOUNTER — Encounter: Admission: RE | Admit: 2009-05-02 | Discharge: 2009-06-29 | Payer: Self-pay | Admitting: Internal Medicine

## 2009-05-02 ENCOUNTER — Encounter: Payer: Self-pay | Admitting: Internal Medicine

## 2009-05-03 ENCOUNTER — Encounter: Payer: Self-pay | Admitting: Internal Medicine

## 2009-05-03 HISTORY — PX: COLONOSCOPY W/ BIOPSIES: SHX1374

## 2009-05-09 ENCOUNTER — Telehealth: Payer: Self-pay | Admitting: Internal Medicine

## 2009-05-10 ENCOUNTER — Encounter: Payer: Self-pay | Admitting: Gastroenterology

## 2009-05-10 ENCOUNTER — Ambulatory Visit: Payer: Self-pay | Admitting: Gastroenterology

## 2009-05-15 ENCOUNTER — Encounter: Payer: Self-pay | Admitting: Gastroenterology

## 2009-05-26 ENCOUNTER — Ambulatory Visit: Payer: Self-pay | Admitting: Gastroenterology

## 2009-05-29 ENCOUNTER — Telehealth: Payer: Self-pay | Admitting: Internal Medicine

## 2009-05-29 ENCOUNTER — Telehealth: Payer: Self-pay | Admitting: Gastroenterology

## 2009-05-30 ENCOUNTER — Telehealth: Payer: Self-pay | Admitting: Gastroenterology

## 2009-05-30 ENCOUNTER — Ambulatory Visit: Payer: Self-pay | Admitting: Internal Medicine

## 2009-05-31 ENCOUNTER — Telehealth: Payer: Self-pay | Admitting: Internal Medicine

## 2009-05-31 LAB — CONVERTED CEMR LAB
BUN: 7 mg/dL (ref 6–23)
Basophils Absolute: 0.1 10*3/uL (ref 0.0–0.1)
Bilirubin Urine: NEGATIVE
Bilirubin, Direct: 0.1 mg/dL (ref 0.0–0.3)
Creatinine, Ser: 0.7 mg/dL (ref 0.4–1.2)
Folate: 15.6 ng/mL
GFR calc non Af Amer: 90.38 mL/min (ref >60)
Glucose, Bld: 80 mg/dL (ref 70–99)
HCT: 37.9 % (ref 36.0–46.0)
HDL: 32.6 mg/dL — ABNORMAL LOW (ref >39.00)
Hemoglobin, Urine: NEGATIVE
Hgb A1c MFr Bld: 5.9 % (ref 4.6–6.5)
Iron: 53 ug/dL (ref 42–145)
Ketones, ur: NEGATIVE mg/dL
LDL Cholesterol: 66 mg/dL (ref 0–99)
Leukocytes, UA: NEGATIVE
Lymphs Abs: 3.8 10*3/uL (ref 0.7–4.0)
MCV: 84.3 fL (ref 78.0–100.0)
Monocytes Absolute: 0.6 10*3/uL (ref 0.1–1.0)
Monocytes Relative: 5.3 % (ref 3.0–12.0)
Neutrophils Relative %: 59.1 % (ref 43.0–77.0)
Platelets: 345 10*3/uL (ref 150.0–400.0)
Potassium: 3.9 meq/L (ref 3.5–5.1)
RDW: 14.3 % (ref 11.5–14.6)
Saturation Ratios: 12.3 % — ABNORMAL LOW (ref 20.0–50.0)
TSH: 0.75 microintl units/mL (ref 0.35–5.50)
Total Bilirubin: 0.6 mg/dL (ref 0.3–1.2)
Total CHOL/HDL Ratio: 4
Urobilinogen, UA: 0.2 (ref 0.0–1.0)
VLDL: 29.4 mg/dL (ref 0.0–40.0)
Vitamin B-12: 332 pg/mL (ref 211–911)
WBC: 11.4 10*3/uL — ABNORMAL HIGH (ref 4.5–10.5)

## 2009-06-08 ENCOUNTER — Telehealth: Payer: Self-pay | Admitting: Internal Medicine

## 2009-06-19 ENCOUNTER — Telehealth: Payer: Self-pay | Admitting: Internal Medicine

## 2009-06-21 ENCOUNTER — Encounter: Payer: Self-pay | Admitting: Internal Medicine

## 2009-07-05 ENCOUNTER — Ambulatory Visit (HOSPITAL_COMMUNITY): Admission: RE | Admit: 2009-07-05 | Discharge: 2009-07-05 | Payer: Self-pay | Admitting: Gastroenterology

## 2009-07-06 ENCOUNTER — Encounter: Payer: Self-pay | Admitting: Gastroenterology

## 2009-07-07 ENCOUNTER — Encounter: Payer: Self-pay | Admitting: Internal Medicine

## 2009-07-21 ENCOUNTER — Encounter: Payer: Self-pay | Admitting: Gastroenterology

## 2009-07-21 ENCOUNTER — Telehealth: Payer: Self-pay | Admitting: Gastroenterology

## 2009-07-25 ENCOUNTER — Encounter: Payer: Self-pay | Admitting: Internal Medicine

## 2009-08-03 ENCOUNTER — Ambulatory Visit: Payer: Self-pay | Admitting: Gastroenterology

## 2009-08-09 ENCOUNTER — Encounter: Payer: Self-pay | Admitting: Internal Medicine

## 2009-08-14 ENCOUNTER — Telehealth: Payer: Self-pay | Admitting: Gastroenterology

## 2009-08-15 ENCOUNTER — Encounter (INDEPENDENT_AMBULATORY_CARE_PROVIDER_SITE_OTHER): Payer: Self-pay | Admitting: *Deleted

## 2009-08-15 ENCOUNTER — Encounter: Payer: Self-pay | Admitting: Gastroenterology

## 2009-08-15 ENCOUNTER — Encounter: Payer: Self-pay | Admitting: Internal Medicine

## 2009-08-16 ENCOUNTER — Telehealth: Payer: Self-pay | Admitting: Gastroenterology

## 2009-08-21 ENCOUNTER — Telehealth: Payer: Self-pay | Admitting: Gastroenterology

## 2009-09-06 ENCOUNTER — Ambulatory Visit: Payer: Self-pay | Admitting: Internal Medicine

## 2009-09-06 ENCOUNTER — Ambulatory Visit: Payer: Self-pay | Admitting: Gastroenterology

## 2009-09-07 ENCOUNTER — Telehealth: Payer: Self-pay | Admitting: Internal Medicine

## 2009-09-18 ENCOUNTER — Telehealth: Payer: Self-pay | Admitting: Internal Medicine

## 2009-10-11 ENCOUNTER — Encounter (INDEPENDENT_AMBULATORY_CARE_PROVIDER_SITE_OTHER): Payer: Self-pay | Admitting: *Deleted

## 2009-10-25 ENCOUNTER — Encounter: Payer: Self-pay | Admitting: Internal Medicine

## 2009-11-09 ENCOUNTER — Encounter: Payer: Self-pay | Admitting: Internal Medicine

## 2009-11-28 ENCOUNTER — Telehealth: Payer: Self-pay | Admitting: Gastroenterology

## 2009-11-30 ENCOUNTER — Ambulatory Visit: Payer: Self-pay | Admitting: Internal Medicine

## 2009-11-30 DIAGNOSIS — K3184 Gastroparesis: Secondary | ICD-10-CM | POA: Insufficient documentation

## 2009-11-30 DIAGNOSIS — N3941 Urge incontinence: Secondary | ICD-10-CM | POA: Insufficient documentation

## 2009-11-30 DIAGNOSIS — N318 Other neuromuscular dysfunction of bladder: Secondary | ICD-10-CM | POA: Insufficient documentation

## 2009-11-30 DIAGNOSIS — F411 Generalized anxiety disorder: Secondary | ICD-10-CM | POA: Insufficient documentation

## 2009-11-30 DIAGNOSIS — N3020 Other chronic cystitis without hematuria: Secondary | ICD-10-CM | POA: Insufficient documentation

## 2009-11-30 DIAGNOSIS — N393 Stress incontinence (female) (male): Secondary | ICD-10-CM | POA: Insufficient documentation

## 2009-11-30 DIAGNOSIS — N63 Unspecified lump in unspecified breast: Secondary | ICD-10-CM | POA: Insufficient documentation

## 2009-12-01 LAB — CONVERTED CEMR LAB
BUN: 6 mg/dL (ref 6–23)
Calcium: 9.7 mg/dL (ref 8.4–10.5)
Chloride: 105 meq/L (ref 96–112)
Creatinine, Ser: 0.9 mg/dL (ref 0.4–1.2)
GFR calc non Af Amer: 67.51 mL/min (ref >60)
Hgb A1c MFr Bld: 5.6 % (ref 4.6–6.5)
VLDL: 35.8 mg/dL (ref 0.0–40.0)

## 2009-12-07 ENCOUNTER — Encounter: Admission: RE | Admit: 2009-12-07 | Discharge: 2009-12-07 | Payer: Self-pay | Admitting: Internal Medicine

## 2009-12-13 ENCOUNTER — Ambulatory Visit: Payer: Self-pay | Admitting: Gastroenterology

## 2009-12-21 ENCOUNTER — Encounter: Payer: Self-pay | Admitting: Internal Medicine

## 2010-02-05 ENCOUNTER — Telehealth: Payer: Self-pay | Admitting: Cardiovascular Disease

## 2010-02-09 ENCOUNTER — Encounter: Payer: Self-pay | Admitting: Gastroenterology

## 2010-02-28 ENCOUNTER — Encounter: Payer: Self-pay | Admitting: Internal Medicine

## 2010-03-02 ENCOUNTER — Ambulatory Visit: Payer: Self-pay | Admitting: Cardiovascular Disease

## 2010-03-10 ENCOUNTER — Encounter: Admission: RE | Admit: 2010-03-10 | Discharge: 2010-03-10 | Payer: Self-pay | Admitting: Neurology

## 2010-03-15 ENCOUNTER — Telehealth: Payer: Self-pay | Admitting: Internal Medicine

## 2010-03-15 ENCOUNTER — Telehealth: Payer: Self-pay | Admitting: Gastroenterology

## 2010-03-15 ENCOUNTER — Encounter (INDEPENDENT_AMBULATORY_CARE_PROVIDER_SITE_OTHER): Payer: Self-pay | Admitting: *Deleted

## 2010-03-15 DIAGNOSIS — L97409 Non-pressure chronic ulcer of unspecified heel and midfoot with unspecified severity: Secondary | ICD-10-CM

## 2010-03-28 ENCOUNTER — Encounter: Payer: Self-pay | Admitting: Internal Medicine

## 2010-04-03 ENCOUNTER — Encounter: Payer: Self-pay | Admitting: Internal Medicine

## 2010-04-03 ENCOUNTER — Ambulatory Visit: Payer: Self-pay | Admitting: Gastroenterology

## 2010-04-03 ENCOUNTER — Telehealth: Payer: Self-pay | Admitting: Internal Medicine

## 2010-04-03 DIAGNOSIS — R159 Full incontinence of feces: Secondary | ICD-10-CM | POA: Insufficient documentation

## 2010-04-09 ENCOUNTER — Encounter: Payer: Self-pay | Admitting: Internal Medicine

## 2010-04-25 ENCOUNTER — Telehealth: Payer: Self-pay | Admitting: Internal Medicine

## 2010-04-26 ENCOUNTER — Telehealth: Payer: Self-pay | Admitting: Internal Medicine

## 2010-05-08 ENCOUNTER — Telehealth: Payer: Self-pay | Admitting: Internal Medicine

## 2010-05-08 ENCOUNTER — Encounter: Payer: Self-pay | Admitting: Internal Medicine

## 2010-06-08 ENCOUNTER — Telehealth: Payer: Self-pay | Admitting: Gastroenterology

## 2010-08-06 ENCOUNTER — Telehealth: Payer: Self-pay | Admitting: Gastroenterology

## 2010-08-30 ENCOUNTER — Ambulatory Visit
Admission: RE | Admit: 2010-08-30 | Discharge: 2010-08-30 | Payer: Self-pay | Source: Home / Self Care | Attending: Internal Medicine | Admitting: Internal Medicine

## 2010-08-30 DIAGNOSIS — M542 Cervicalgia: Secondary | ICD-10-CM | POA: Insufficient documentation

## 2010-09-23 ENCOUNTER — Encounter: Payer: Self-pay | Admitting: Internal Medicine

## 2010-09-23 ENCOUNTER — Encounter: Payer: Self-pay | Admitting: Gastroenterology

## 2010-09-23 ENCOUNTER — Encounter: Payer: Self-pay | Admitting: Obstetrics and Gynecology

## 2010-10-04 NOTE — Medication Information (Signed)
Summary: Diabetic Supplies / [facility]  Diabetic Supplies / [facility]   Imported By: Lennie Odor 05/11/2010 14:41:33  _____________________________________________________________________  External Attachment:    Type:   Image     Comment:   External Document

## 2010-10-04 NOTE — Progress Notes (Signed)
Summary: medication refill  Phone Note Refill Request Message from:  Fax from Pharmacy on April 25, 2010 10:14 AM  Refills Requested: Medication #1:  LYRICA 150 MG CAPS as needed   Dosage confirmed as above?Dosage Confirmed   Notes: [name] [place] WGN#[id number] Initial call taken by: Zella Ball Ewing CMA Duncan Dull),  April 25, 2010 10:14 AM  Follow-up for Phone Call        please clarify with pt how many times per day she takes this Follow-up by: Corwin Levins MD,  April 25, 2010 1:15 PM  Additional Follow-up for Phone Call Additional follow up Details #1::        called pt and she stated she takes it 1 to 2 by mouth once daily as needed. She also said she needed a refill on her Uticap.  Additional Follow-up by: Robin Ewing CMA Duncan Dull),  April 25, 2010 1:55 PM    Additional Follow-up for Phone Call Additional follow up Details #2::    done hardcopy to LIM side B - dahlia  Follow-up by: Corwin Levins MD,  April 25, 2010 2:19 PM  Additional Follow-up for Phone Call Additional follow up Details #3:: Details for Additional Follow-up Action Taken: Rx refilled and printed Rx faxed to pharmacy Additional Follow-up by: Margaret Pyle, CMA,  April 25, 2010 2:22 PM  New/Updated Medications: LYRICA 150 MG CAPS (PREGABALIN) 1-2 by mouth once daily as needed Prescriptions: UTICAP 120 MG CAPS (METH-HYO-M BL-NA PHOS-PH SAL) 1po three times a day as needed  #270 x 3   Entered by:   Margaret Pyle, CMA   Authorized by:   Corwin Levins MD   Signed by:   Margaret Pyle, CMA on 04/25/2010   Method used:   Electronically to        [facility] [address] [phone]* (retail)       [address] [place], [place]  [address]       Ph: [phone]       Fax: [phone]   RxID:   [id number] LYRICA 150 MG CAPS (PREGABALIN) 1-2 by mouth once daily as needed  #180 x 1   Entered and Authorized by:   Corwin Levins MD   Signed by:   Corwin Levins MD on 04/25/2010   Method used:    Print then Give to Patient   RxID:   [id number]

## 2010-10-04 NOTE — Letter (Signed)
Summary: Appt Reminder 2  [facility]  [address] [place], [place] [address]   Phone: [phone]  Fax: [phone]        March 15, 2010 MRN: [id number]    [name] [address] [place], [place]  [address]    Dear Ms. Alkire,   You have a return appointment with Dr.Robert Arlyce Dice on 04-03-10 at 10:15am. Please remember to bring a complete list of the medicines you are taking, your insurance card and your co-pay.  If you have to cancel or reschedule this appointment, please call before 5:00 pm the evening before to avoid a cancellation fee.  If you have any questions or concerns, please call [phone].    Sincerely,    Laureen Ochs LPN  Appended Document: Appt Reminder 2 Letter mailed to patient.
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
## 2010-10-04 NOTE — Letter (Signed)
Summary: [facility]   [facility]   Imported By: Lester La Cygne 02/14/2010 08:50:56  _____________________________________________________________________  External Attachment:    Type:   Image     Comment:   External Document
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
## 2010-10-04 NOTE — Letter (Signed)
Summary: [facility]  [facility]   Imported By: Lester [place] 11/16/2009 10:28:26  _____________________________________________________________________  External Attachment:    Type:   Image     Comment:   External Document

## 2010-10-04 NOTE — Progress Notes (Signed)
Summary: Refill medication  ---- Converted from flag ---- ---- 08/06/2010 11:28 AM, Louis Meckel MD wrote: ok to refill  ---- 08/06/2010 11:11 AM, Merri Ray CMA (AAMA) wrote: This pt needs a refill on this medication. Do you want to refill or refer to PCP ------------------------------  Phone Note Refill Request Message from:  Pharmacy on August 06, 2010 11:34 AM  Refills Requested: Medication #1:  PROCHLORPERAZINE 25 MG SUPP Put one in rectum every 12 hours as needed for nausea.   Dosage confirmed as above?Dosage Confirmed   Brand Name Necessary? No

## 2010-10-04 NOTE — Letter (Signed)
Summary: Office Visit Letter  [facility]  [address] [place], [place] [address]   Phone: [phone]  Fax: [phone]      October 11, 2009 MRN: [id number]   [name] [address] [place], [place]  [address]   Dear [name],   According to our records, it is time for you to schedule a follow-up office visit with [place] in the month of March 2011.   At your convenience, please call [phone] (option #2)to schedule an office visit. If you have any questions, concerns, or feel that this letter is in error, we would appreciate your call.   Sincerely,  Barbette Hair. Arlyce Dice, M.D.   [facility] [phone]

## 2010-10-04 NOTE — Progress Notes (Signed)
Summary: Pt req Letter  Phone Note Call from Patient Call back at Home Phone [phone]   Caller: Patient [phone] Summary of Call: Pt left a walk in sheet to [employer] requesting a letter to Social Services stating that pt is in need of Medicaid. Pt says she need Medicaid to help with the cost of Ensure (dysphagia), Depends (bowel incontinence) and Poise (urinary incontinence). Please advise Initial call taken by: Margaret Pyle, CMA,  April 03, 2010 11:14 AM  Follow-up for Phone Call        ok for letter regarding the need for ensure, depends and poise, but  rememeber a specific letter by me to qualify specifically for medicaid health benefits is not needed  done hardcopy to [facility] side B - dahlia  Follow-up by: Corwin Levins MD,  April 03, 2010 1:55 PM  Additional Follow-up for Phone Call Additional follow up Details #1::        Pt informed and letter mailed to pt per her request. Address verified Additional Follow-up by: Margaret Pyle, CMA,  April 03, 2010 2:35 PM

## 2010-10-04 NOTE — Consult Note (Signed)
Summary: [facility]   Imported By: Lester [place] 04/12/2010 09:58:16  _____________________________________________________________________  External Attachment:    Type:   Image     Comment:   External Document

## 2010-10-04 NOTE — Letter (Signed)
Summary: CMN for Diabetic shoes & Inserts/[facility]  CMN for Diabetic shoes & Inserts/[facility]   Imported By: Sherian Rein 10/26/2009 10:07:34  _____________________________________________________________________  External Attachment:    Type:   Image     Comment:   External Document

## 2010-10-04 NOTE — Progress Notes (Signed)
----   Converted from flag ---- ---- 11/28/2009 11:15 AM, Louis Meckel MD wrote: yes  ---- 11/28/2009 10:55 AM, Merri Ray CMA (AAMA) wrote: I got a refill request for Promethegan suppository. Is it ok to fill?? ------------------------------ refilled elecrtonic request per Dr Nita Sells OK

## 2010-10-04 NOTE — Progress Notes (Signed)
Summary: ABX?  Phone Note Call from Patient Call back at Home Phone [phone]   Caller: Patient Summary of Call: pt called stating that she is still having congestion and sinus HA. pt is requesting another course of ABX Initial call taken by: Margaret Pyle, CMA,  September 18, 2009 2:13 PM  Follow-up for Phone Call        if no fever, ok to hold off on further antibx at this time;  i would try the Mucinex D (OTC) two times a day as needed  Follow-up by: Corwin Levins MD,  September 18, 2009 2:21 PM  Additional Follow-up for Phone Call Additional follow up Details #1::        pt informed and will take Mucinex Additional Follow-up by: Margaret Pyle, CMA,  September 18, 2009 3:45 PM
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
## 2010-10-04 NOTE — Letter (Signed)
Summary: [facility]  [facility]   Imported By: Lester Melissa 12/29/2009 10:34:02  _____________________________________________________________________  External Attachment:    Type:   Image     Comment:   External Document

## 2010-10-04 NOTE — Medication Information (Signed)
Summary: [facility]  [facility]   Imported By: Sherian Rein 04/05/2010 12:10:03  _____________________________________________________________________  External Attachment:    Type:   Image     Comment:   External Document

## 2010-10-04 NOTE — Progress Notes (Signed)
Summary: Alt Med  Phone Note Call from Patient   Caller: Patient [phone] Summary of Call: pt called stating that her Insurance will not cover Tesselon perles. pt is requesting an alt medication. Initial call taken by: Margaret Pyle, CMA,  September 07, 2009 3:01 PM  Follow-up for Phone Call        this particular medication is on the $4 list at [facility], [facility] , and  [facility] (I thought)  - can she ask how much the med is CASH-wise?  - it is very unlikely other meds will be less expensive Follow-up by: Corwin Levins MD,  September 07, 2009 3:08 PM  Additional Follow-up for Phone Call Additional follow up Details #1::        called pt back, phone rang x 15, no answer, no VM Additional Follow-up by: Margaret Pyle, CMA,  September 07, 2009 3:38 PM    Additional Follow-up for Phone Call Additional follow up Details #2::    pt is informed Follow-up by: Margaret Pyle, CMA,  September 07, 2009 4:35 PM
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
## 2010-10-04 NOTE — Letter (Signed)
Summary: [facility]  [facility]   Imported By: Sherian Rein 04/02/2010 14:57:36  _____________________________________________________________________  External Attachment:    Type:   Image     Comment:   External Document
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
## 2010-10-04 NOTE — Progress Notes (Signed)
Summary: medication Change  Phone Note From Pharmacy   Caller: [facility] [address] [phone]* Summary of Call: Pharmacy requesting change as Uticaps has been discontinued by the manufacturer, need alternative medication Initial call taken by: Robin Ewing CMA Duncan Dull),  April 26, 2010 8:38 AM  Follow-up for Phone Call        I am not familiar with using this medication as a primary care MD and smiliar meds;  does the pharmacy suggest an alternative?  o/w pt will need to call or see urology Follow-up by: Corwin Levins MD,  April 26, 2010 11:00 AM  Additional Follow-up for Phone Call Additional follow up Details #1::        Informed pharmacy of above information to suggest alternative or pt. will need to see urologist. Additional Follow-up by: Robin Ewing CMA Duncan Dull),  April 26, 2010 11:36 AM     Appended Document: medication Change med changed - see emr  please notify pt  Appended Document: medication Change Pt informed of Rx change. Pt states she will follow up with Urology is needed

## 2010-10-04 NOTE — Letter (Signed)
Summary: [facility]  [facility]   Imported By: Lester Trenton 03/13/2010 08:13:18  _____________________________________________________________________  External Attachment:    Type:   Image     Comment:   External Document

## 2010-10-04 NOTE — Progress Notes (Signed)
Summary: Pt cancelled Barium Swallow  Phone Note Outgoing Call Call back at [facility] Phone [phone]   Call placed by: Merri Ray CMA Duncan Dull),  June 08, 2010 2:25 PM Summary of Call: Pt cancelled barium swallow dut to losing her Medicaid  and cant afford any test  at this time Initial call taken by: Merri Ray CMA Duncan Dull),  June 08, 2010 2:26 PM
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
## 2010-10-04 NOTE — Progress Notes (Signed)
Summary: Triage  Phone Note Call from Patient Call back at Home Phone [phone]   Caller: Patient Call For: Dr. Arlyce Dice Reason for Call: Talk to Nurse Summary of Call: Pt. is having problems w/her urinary tract and diarrhea/stool incontinence. Wants to know if Dr. Arlyce Dice can write a script for her Depends, then her insurance will pay 80%.  Initial call taken by: Karna Christmas,  March 15, 2010 9:43 AM  Follow-up for Phone Call        Pt. states Dr.Lio Wehrly is aware of her stool incontinence. She states the script needs to say that Dr.Zakery Normington feels she needs Pull-ups/Poise underwear and he is ordering them.   [facility]   Follow-up by: Laureen Ochs LPN,  March 15, 2010 10:12 AM  Additional Follow-up for Phone Call Additional follow up Details #1::        ok Additional Follow-up by: Louis Meckel MD,  March 15, 2010 11:47 AM    Additional Follow-up for Phone Call Additional follow up Details #2::    Script mailed to patient. Pt. instructed to call back as needed.  Follow-up by: Laureen Ochs LPN,  March 15, 2010 12:14 PM

## 2010-10-04 NOTE — Progress Notes (Signed)
Summary: Referral  Phone Note Call from Patient Call back at Home Phone [phone]   Caller: Patient Summary of Call: Pt called requesting referral to a Diabetic foot specialist for persistant sores on her feet. Initial call taken by: Margaret Pyle, CMA,  March 15, 2010 9:59 AM  Follow-up for Phone Call        ok - will refer  Follow-up by: Corwin Levins MD,  March 15, 2010 12:39 PM  New Problems: DIABETIC FOOT ULCER (ICD-707.14)   New Problems: DIABETIC FOOT ULCER (ICD-707.14)

## 2010-10-04 NOTE — Letter (Signed)
Summary: Generic Letter  [facility]  [address] [place], [place] [address]   Phone: [phone]  Fax: [phone]    04/03/2010  Korey Sudol [address] [place], [place]  [address]  Dear Ms. Requejo,       This letter is to certify that you are in   need of daily Ensure for dysphagia, Depends   undergarment for chronic diarrea, and Poise for  urinary incontinence protection.           Sincerely,   Oliver Barre MD

## 2010-10-09 ENCOUNTER — Ambulatory Visit: Payer: Self-pay | Admitting: Cardiovascular Disease

## 2010-10-15 ENCOUNTER — Telehealth: Payer: Self-pay | Admitting: Internal Medicine

## 2010-10-24 NOTE — Progress Notes (Signed)
Summary: BS Elevated  Phone Note Call from Patient Call back at Home Phone [phone] Call back at Cell [phone]   Caller: Patient Call For: Corwin Levins MD Summary of Call: Patient called and left msg. on triage. Her BS has been as high as 400, and was 241 this morning. She has increased her glimepiride to 1 whole pill per day. But BS still is too high. ( She did take 1 and 1/2 when it was at 400 and BS dropped to 39). She is only eating one meal per day as has no appetite. Her pharmacy is [facility]. Initial call taken by: Robin Ewing CMA Duncan Dull),  October 15, 2010 11:50 AM  Follow-up for Phone Call        this means she apparently is sensitive to the glimeparide at higher strengths and we should avoid just increaseing the glimeparide  she has had intolerance to metformin  the next best thing is stop the glimpearide, and take  januvia 100 mg once daily - I will do rx Also, needs ROV in 4wks, or sooner if needed Follow-up by: Corwin Levins MD,  October 15, 2010 11:56 AM  Additional Follow-up for Phone Call Additional follow up Details #1::        called pt. back informed of above information. Patient will call back and schedule her 4 week ROV. Additional Follow-up by: Robin Ewing CMA (AAMA),  October 15, 2010 12:01 PM    New/Updated Medications: JANUVIA 100 MG TABS (SITAGLIPTIN PHOSPHATE) 1 by mouth once daily Prescriptions: JANUVIA 100 MG TABS (SITAGLIPTIN PHOSPHATE) 1 by mouth once daily  #30 x 11   Entered and Authorized by:   Corwin Levins MD   Signed by:   Corwin Levins MD on 10/15/2010   Method used:   Electronically to        [facility] [address] [phone]* (retail)       [address] [place], [place]  [address]       Ph: [phone]       Fax: [phone]   RxID:   [id number]

## 2010-11-06 ENCOUNTER — Encounter: Payer: Self-pay | Admitting: Cardiovascular Disease

## 2010-11-07 ENCOUNTER — Telehealth: Payer: Self-pay | Admitting: Internal Medicine

## 2010-11-13 NOTE — Progress Notes (Signed)
Summary: [name] [facility]  Phone Note Call from Patient Call back at Home Phone [phone]   Summary of Call: Patient is requesting a call regarding new blood sugar med.  Initial call taken by: Lamar Sprinkles, CMA,  November 07, 2010 10:35 AM  Follow-up for Phone Call        Pt called stating Januvia is causing abd pain and vomiting. Pt is requesting alternative medication and will schedule 4 week ROV for next week. Follow-up by: Margaret Pyle, CMA,  November 07, 2010 2:17 PM  Additional Follow-up for Phone Call Additional follow up Details #1::        [name] does not normally do this; but I can change to onglyza 5 mg per day - to robin to handle Additional Follow-up by: Corwin Levins MD,  November 07, 2010 2:31 PM    Additional Follow-up for Phone Call Additional follow up Details #2::    called pt. informed prescription has been changed and sent to her pharmacy. Follow-up by: Zella Ball Ewing CMA (AAMA),  November 07, 2010 3:50 PM  New/Updated Medications: ONGLYZA 5 MG TABS (SAXAGLIPTIN HCL) 1 by mouth once daily Prescriptions: ONGLYZA 5 MG TABS (SAXAGLIPTIN HCL) 1 by mouth once daily  #30 x 11   Entered by:   Zella Ball Ewing CMA (AAMA)   Authorized by:   Corwin Levins MD   Signed by:   Scharlene Gloss CMA (AAMA) on 11/07/2010   Method used:   Faxed to ...       [facility] [address] (retail)       [address] [place], [place]  [address]       Ph: [phone]       Fax: [phone]   RxID:   [id number]

## 2010-11-20 ENCOUNTER — Encounter: Payer: Self-pay | Admitting: Cardiovascular Disease

## 2010-11-20 ENCOUNTER — Ambulatory Visit (INDEPENDENT_AMBULATORY_CARE_PROVIDER_SITE_OTHER): Payer: Medicare Other | Admitting: Cardiovascular Disease

## 2010-11-20 VITALS — BP 110/60 | HR 84 | Resp 18 | Ht 65.0 in | Wt 191.4 lb

## 2010-11-20 DIAGNOSIS — I10 Essential (primary) hypertension: Secondary | ICD-10-CM

## 2010-11-20 DIAGNOSIS — E785 Hyperlipidemia, unspecified: Secondary | ICD-10-CM

## 2010-11-20 DIAGNOSIS — I2510 Atherosclerotic heart disease of native coronary artery without angina pectoris: Secondary | ICD-10-CM

## 2010-11-20 MED ORDER — PRAVASTATIN SODIUM 80 MG PO TABS: 80.0000 mg | ORAL_TABLET | Freq: Every evening | ORAL | Status: DC

## 2010-11-20 NOTE — Progress Notes (Signed)
HPI: This is a 65 year old woman with coronary artery disease. The patient has been managed medically because she has only single vessel disease of a modest sized obtuse marginal branch. She presents today for followup evaluation.  She reports more frequent chest pains of late. Associated symptoms include weakness, dyspnea, and dizziness. She has chest pains with walking and also at rest. She states it feels like something is 'swelling up.' She has been under a lot of stress recently at home.    Outpatient Encounter Prescriptions as of 11/20/2010  Medication Sig Dispense Refill  . albuterol-ipratropium (COMBIVENT) 18-103 MCG/ACT inhaler Inhale 2 puffs into the lungs every 6 (six) hours as needed.        Marland Kitchen amLODipine-olmesartan (AZOR) 10-40 MG per tablet Take 1 tablet by mouth daily.        Marland Kitchen aspirin 81 MG EC tablet Take 81 mg by mouth daily.        Marland Kitchen atorvastatin (LIPITOR) 20 MG tablet Take 20 mg by mouth daily.        . clonazePAM (KLONOPIN) 2 MG tablet Take 0.5 mg by mouth 2 (two) times daily as needed.        Marland Kitchen dexlansoprazole (DEXILANT) 60 MG capsule Take 60 mg by mouth daily.        . DULoxetine (CYMBALTA) 30 MG capsule Take 30 mg by mouth 2 (two) times daily.       Marland Kitchen erythromycin (E-MYCIN) 250 MG tablet Take 250 mg by mouth as needed. As directed      . fish oil-omega-3 fatty acids 1000 MG capsule Take 2 g by mouth daily.        Marland Kitchen gabapentin (NEURONTIN) 300 MG capsule Take 600 mg by mouth. 1 TIME DAILY       . glycopyrrolate (ROBINUL) 2 MG tablet Take 2 mg by mouth 3 (three) times daily.        . hydrOXYzine (VISTARIL) 25 MG capsule Take 25 mg by mouth 3 (three) times daily as needed.        . lidocaine (LIDODERM) 5 % Place 1 patch onto the skin daily. Remove & Discard patch within 12 hours or as directed by MD       . nebivolol (BYSTOLIC) 10 MG tablet Take 10 mg by mouth daily.        . nitroGLYCERIN (NITROSTAT) 0.4 MG SL tablet Place 0.4 mg under the tongue every 5 (five) minutes as  needed.        Marland Kitchen omeprazole (PRILOSEC) 20 MG capsule Take 20 mg by mouth daily.        Marland Kitchen oxyCODONE-acetaminophen (PERCOCET) 5-325 MG per tablet Take 1 tablet by mouth. PRN        . pregabalin (LYRICA) 150 MG capsule Take 150 mg by mouth as needed.       . prochlorperazine (COMPAZINE) 25 MG suppository Place 25 mg rectally every 12 (twelve) hours as needed.        . promethazine (PHENERGAN) 12.5 MG tablet Take 12.5 mg by mouth every 6 (six) hours as needed.        . triamcinolone (NASACORT) 55 MCG/ACT nasal inhaler 2 sprays by Nasal route daily.        . sitaGLIPtan (JANUVIA) 100 MG tablet Take 100 mg by mouth daily.        Marland Kitchen DISCONTD: clindamycin (CLEOCIN) 300 MG capsule Take 300 mg by mouth 3 (three) times daily.        Marland Kitchen DISCONTD: doxycycline (ADOXA) 100 MG  tablet Take 100 mg by mouth 2 (two) times daily.        Marland Kitchen DISCONTD: predniSONE (DELTASONE) 10 MG tablet Take 10 mg by mouth. AS DIRECTED         Allergies  Allergen Reactions  . Januvia (Sitagliptin Phosphate) Nausea Only  . Latex   . Metformin     REACTION: Dizziness \\T \ Nausea  . Metoclopramide Hcl   . Penicillins   . Sulfonamide Derivatives     REACTION: Reaction not known    Past Medical History  Diagnosis Date  . Obesity   . Allergic   . Seizure   . Skin cancer   . Asthma   . Depression   . GERD (gastroesophageal reflux disease)   . Hypertension   . Tobacco user   . CAD (coronary artery disease)   . Fibromyalgia   . Chronic pain   . Dyslipidemia   . Diverticul disease small and large intestine, no perforati or abscess   . Gastroparesis   . Overactive bladder   . Anxiety   . Hyperlipidemia   . Urge and stress incontinence   . Chronic cystitis   . Rectal prolapse     ROS: Negative except as per HPI  BP 110/60  Pulse 84  Resp 18  Ht 5\' 5"  (1.651 m)  Wt 191 lb 6.4 oz (86.818 kg)  BMI 31.85 kg/m2  PHYSICAL EXAM: Pt is alert and oriented, NAD HEENT: normal Neck: JVP - normal, carotids 2+= without  bruits Lungs: CTA bilaterally CV: RRR without murmur or gallop Abd: soft, NT, Positive BS, no hepatomegaly Ext: no C/C/E, distal pulses intact and equal Skin: warm/dry no rash  EKG: Normal sinus rhythm 78 beats per minute, within normal limits.  ASSESSMENT AND PLAN:

## 2010-11-20 NOTE — Assessment & Plan Note (Signed)
The patient requested change statin medication due to cost. Will give her trial of pravastatin 80 mg. She has upcoming lab work with Dr. Jonny Ruiz.

## 2010-11-20 NOTE — Assessment & Plan Note (Signed)
The patient appears stable. Her EKG is normal. She has a chronic pain syndrome and I'm not surprised that she is having episodic chest pain. Recommend continued medical management. We discussed the importance of weight loss and she is eager to start the Nutrisystem diet.

## 2010-11-20 NOTE — Patient Instructions (Signed)
You will f/u with Dr. Excell Seltzer in 1 year Please stop your Lipitor and START PRAVASTATIN 80mg  by mouth daily.

## 2010-12-07 LAB — GLUCOSE, CAPILLARY: Glucose-Capillary: 75 mg/dL (ref 70–99)

## 2010-12-12 LAB — COMPREHENSIVE METABOLIC PANEL
AST: 23 U/L (ref 0–37)
CO2: 25 mEq/L (ref 19–32)
Chloride: 106 mEq/L (ref 96–112)
Creatinine, Ser: 0.79 mg/dL (ref 0.4–1.2)
GFR calc Af Amer: >60 mL/min (ref >60)
GFR calc non Af Amer: >60 mL/min (ref >60)
Glucose, Bld: 103 mg/dL — ABNORMAL HIGH (ref 70–99)
Total Bilirubin: 0.5 mg/dL (ref 0.3–1.2)

## 2010-12-12 LAB — URINALYSIS, ROUTINE W REFLEX MICROSCOPIC
Bilirubin Urine: NEGATIVE
Glucose, UA: NEGATIVE mg/dL
Ketones, ur: NEGATIVE mg/dL
pH: 7 (ref 5.0–8.0)

## 2010-12-12 LAB — DIFFERENTIAL
Basophils Absolute: 0.1 10*3/uL (ref 0.0–0.1)
Eosinophils Absolute: 0.2 10*3/uL (ref 0.0–0.7)
Eosinophils Relative: 2 % (ref 0–5)
Lymphocytes Relative: 35 % (ref 12–46)
Neutrophils Relative %: 58 % (ref 43–77)

## 2010-12-12 LAB — CBC
HCT: 43.2 % (ref 36.0–46.0)
Hemoglobin: 15 g/dL (ref 12.0–15.0)
MCV: 85.4 fL (ref 78.0–100.0)
RBC: 5.06 MIL/uL (ref 3.87–5.11)
WBC: 12.8 10*3/uL — ABNORMAL HIGH (ref 4.0–10.5)

## 2010-12-12 LAB — PROTIME-INR
INR: 1 (ref 0.00–1.49)
Prothrombin Time: 12.9 seconds (ref 11.6–15.2)

## 2010-12-12 LAB — GLUCOSE, CAPILLARY
Glucose-Capillary: 106 mg/dL — ABNORMAL HIGH (ref 70–99)
Glucose-Capillary: 109 mg/dL — ABNORMAL HIGH (ref 70–99)
Glucose-Capillary: 129 mg/dL — ABNORMAL HIGH (ref 70–99)
Glucose-Capillary: 138 mg/dL — ABNORMAL HIGH (ref 70–99)

## 2010-12-12 LAB — URINE MICROSCOPIC-ADD ON

## 2010-12-18 LAB — POCT CARDIAC MARKERS: Myoglobin, poc: 71.5 ng/mL (ref 12–200)

## 2010-12-18 LAB — POCT I-STAT, CHEM 8
BUN: 7 mg/dL (ref 6–23)
Creatinine, Ser: 0.8 mg/dL (ref 0.4–1.2)
Sodium: 140 mEq/L (ref 135–145)
TCO2: 22 mmol/L (ref 0–100)

## 2010-12-20 ENCOUNTER — Other Ambulatory Visit: Payer: Self-pay | Admitting: Family Medicine

## 2010-12-20 DIAGNOSIS — N644 Mastodynia: Secondary | ICD-10-CM

## 2010-12-27 ENCOUNTER — Ambulatory Visit
Admission: RE | Admit: 2010-12-27 | Discharge: 2010-12-27 | Disposition: A | Discharge status: Home / Self Care | Payer: Medicare Other | Source: Ambulatory Visit | Attending: Family Medicine | Admitting: Family Medicine

## 2010-12-27 DIAGNOSIS — N644 Mastodynia: Secondary | ICD-10-CM

## 2011-01-03 ENCOUNTER — Other Ambulatory Visit: Payer: Self-pay | Admitting: Internal Medicine

## 2011-01-15 NOTE — Assessment & Plan Note (Signed)
[name] is a 65 year old female with cervical post laminectomy  syndrome as well as lumbar degenerative disk.  She also has fibromyalgia  syndrome.  She was last seen by me on November 11, 2008.  Physical therapy  was recommended for trial traction as well as myofascial relief.  PT did  not feel comfortable with doing traction, as she has had a history of  fusion.  The patient states that she has seen Dr. Otelia Sergeant in the interval  of the time.  Told that part of her fusion did not take I do not have  any notes from his office at this point regarding that.   We did switch her from hydrocodone and put her MS Contin CR 50 mg p.o.  b.i.d., which has resulted in some improvements.  Her Oswestry score  56%, which is stable compared to last visit which she had with a nurse.   Her pain averages 8, currently 5 with a new medication.   She can walk 5 minutes at a time.  She does not climb steps, does not  drive.  She has some bowel and bladder problems, which are chronic.  She  has alternating diarrhea, constipation, and shortness of breath.  She  sees Dr. Jonny Ruiz from Internal Medicine.  She has a new onset of diabetes  per her report, high blood pressure as well.   SOCIAL HISTORY:  Married, lives with her husband.   The patient also has been trialed by myself on Cymbalta 30 mg a day.  This seems to be helping her in terms of her tearfulness.   Orientation x3.  Affect is alert.  Gait is normal.   Neck, there is mild tenderness to palpation of the cervical paraspinals.  Upper extremity strength is normal.  Lower extremity strength is normal.  Gait is normal.  No evidence of gait imbalance.  There is no evidence of  hyperreflexia.   IMPRESSION:  1. Cervical post laminectomy syndrome.  2. Lumbar degenerative disk disease.   PLAN:  1. We will continue her on MS Contin 15 p.o. b.i.d.  2. Continue Cymbalta 30 mg a day.  3. Continue Lyrica 225 one p.o. b.i.d.   I will see her back in 2 months  and nursing visit in 1 month.      Erick Colace, M.D.  Electronically Signed     AEK/MedQ  D:  12/08/2008 15:47:07  T:  12/09/2008 02:20:00  Job #:  [id number]   cc:   Dr. Jonny Ruiz

## 2011-01-15 NOTE — Assessment & Plan Note (Signed)
[facility]                                 ON-CALL NOTE   JAMISEN, HAWES                    MRN:          [id number]  DATE:07/05/2007                            DOB:          1946/06/18    PHONE NUMBER:  [phone]   SUBJECTIVE:  The patient has headache with high blood pressure.  She has  not felt well for the last few weeks.  She says that things are going  straight through her including her pills.  She was seen at the emergency  room on Thursday and was given pain medication.  Her blood pressure now  is 173/93.  She is on Benicar HCT, amlodipine, and metoprolol half  tablet a day for blood pressure, all of which she takes in the morning.  She was changed from a whole to a half of metoprolol 50 mg pill  approximately one year ago.  She has a headache today that is bothering  her and wonders what to do.   ASSESSMENT:  High blood pressure with headache.   PLAN:  Assume that her headache is from her pressure being somewhat out  of control and would suggest she take another half of metoprolol at the  present time and then I would suggest switching her metoprolol to  nighttime.  Also suggest that she take two Tylenol now.  If anything  changes, go to the emergency room to be assessed.  Otherwise, see Barbette Hair. Artist Pais, DO as soon as possible.   PRIMARY CARE PHYSICIAN:  Barbette Hair. Artist Pais, DO, home office Hope Budds, MD  Electronically Signed    RNS/MedQ  DD: 07/05/2007  DT: 07/06/2007  Job #: [id number]

## 2011-01-15 NOTE — Assessment & Plan Note (Signed)
[facility]                            CARDIOLOGY OFFICE NOTE   Teresa, Mathews                    MRN:          [id number]  DATE:12/10/2007                            DOB:          12-03-1945    [name] returns for followup at the [facility] office on  December 10, 2007.  [name] is a delightful 65 year old woman with  hypertension, dyslipidemia,  coronary artery disease and tobacco abuse.  She underwent cardiac catheterization in December 2008 to further  investigate chronic chest pain.  She was found to have moderate stenosis  in a branch of the left circumflex, but she has done well with medical  therapy.  Since I have been seeing her, her chest pain has predominately  been atypical in nature.  Presently she is doing well.  Her chest pain  persists, but it is much less severe in nature.  She continues to have  symptoms, mainly at rest.  [name] has chronic shortness of breath  that is unchanged.  She continues to smoke cigarettes.  She has no new  symptoms and specifically denies dizziness, lightheadedness, orthopnea,  PND or edema.  She has no exertional symptoms.   MEDICATIONS:  1. Lipitor 20 mg daily.  2. Seroquel 100 mg daily.  3. Hyoscyamine daily.  4. Invega 3 mg daily.  5. Aspirin 81 mg daily.  6. Omega-3 fish oil twice daily.  7. Tegretol 200 mg 3 times a day (on hold).  8. Clonazepam 0.5 mg b.i.d.  9. Lyrica 75 mg b.i.d.  10.Bystolic 10 mg daily.  11.AZOR 10/40 mg daily.   ALLERGIES:  Include PENICILLIN and METFORMIN.   PHYSICAL EXAMINATION:  GENERAL:  The patient is alert and oriented.  She  is in no distress.  VITAL SIGNS:  Weight is 209. Blood pressure 132/74, heart rate 71,  respiratory rate 18.  HEENT:  Normal.  NECK:  Normal carotid upstrokes without bruits.  Jugular venous pressure  is normal.  LUNGS:  Clear bilaterally.  HEART:  Regular rate and rhythm without murmurs or gallops.  ABDOMEN:  Soft, obese, nontender, no organomegaly.  EXTREMITIES:  No clubbing, cyanosis or edema.  Peripheral pulses 2+ and  equal.   EKG shows sinus rhythm and is within normal limits.  The heart rate is  71 beats per minute.   ASSESSMENT:  1. Coronary artery disease.  Remains stable with no evidence of angina      other than atypical pain as noted.  Chest pain is greatly improved.      Continue current medications without changes.  Smoking cessation      reviewed.  2. Hypertension.  Blood pressure improved.  Her hypertension is      treated by Dr. Artist Pais.  3. Dyslipidemia.  Remains on Lipitor.  Lipids from February of this      year showed a total cholesterol of 115, HDL 35, LDL 52.   For followup, I would like to see [name] back in 6 months.  I  would be happy to see her sooner if any problems arise.  Veverly Fells. Excell Seltzer, MD  Electronically Signed    MDC/MedQ  DD: 12/14/2007  DT: 12/14/2007  Job #: [id number]   cc:   Barbette Hair. Artist Pais, DO

## 2011-01-15 NOTE — Assessment & Plan Note (Signed)
[facility]                            CARDIOLOGY OFFICE NOTE   TYRONDA, VIZCARRONDO                    MRN:          [id number]  DATE:09/09/2007                            DOB:          1946/02/08    Meghann Landing returns for follow-up at the [facility] office  on September 09, 2007.  Ms. Schlick is a 65 year old woman with  hypertension, dyslipidemia, coronary artery disease and tobacco abuse.  She has a chronic pain syndrome.  When I saw her in December she  continued to have problems with chest pain.  We decided to move forward  with a cardiac catheterization.  This was performed on December 19 and  showed single-vessel coronary artery disease with a moderate focal  stenosis in and a branch of the left circumflex.  She otherwise had  minimal coronary disease.  I elected to treat her medically since it  involved a fairly small region of her myocardium and her chest pain is  not suggestive of angina.   Ms. Goga is doing fairly well on medical therapy.  She has a lot of  difficulty with anxiety and develops significant hypertension when her  anxiety level goes up.  She tells me that she gets chest pain as well.  She gets chest pain with anxiety and these spikes in her blood pressure  as well.  She has not had exertional chest pain.  She denies dyspnea,  edema, orthopnea or PND.   CURRENT MEDICATIONS:  1. Aspirin 81 mg daily.  2. Fish oil.  3. Risperdal 1.5 mg twice daily and at bedtime.  4. Tegretol 200 mg t.i.d.  5. Zyrtec 10 mg daily as needed.  6. Clonazepam 0.5 mg twice daily.  7. Lyrica 75 mg twice daily.  8. Methocarbamol 1-1/2 daily.  9. Bystolic 10 mg daily.  10.Azor 10/40 mg daily.  11.Diazepam as needed.  12.Lipitor.  13.Seroquel.   ALLERGIES:  PENICILLIN AND METFORMIN.   EXAM:  The patient is alert and oriented, she is in no acute distress.  Weight is 204, blood pressure is 155/82, heart rate 97, respiratory  rate  16.  HEENT:  Normal.  NECK:  Normal carotid upstrokes without bruits, jugular venous pressure  is normal.  LUNGS:  Clear to auscultation bilaterally.  HEART:  Regular rate and rhythm without murmurs, gallops.  ABDOMEN:  Soft, obese, nontender, no organomegaly.  EXTREMITIES:  No clubbing, cyanosis or edema.  Peripheral pulses 2+ and  equal throughout.   ASSESSMENT:  Ms. Mendonca is stable from a cardiac standpoint.  Her  current issues are as follows:  1. Coronary artery disease.  Continue current therapy.  She is      followed closely by Dr. Artist Pais.  I would like her to continue on      Lipitor, aspirin and her antihypertensive regimen.  We discussed      the importance of smoking cessation once again.  2. Hypertension.  She informed me that Dr. Artist Pais is adjusting her      medications.  She has noted some improvement in blood pressure with  these changes.  She is having a lot of trouble with anxiety as      detailed above and this exacerbates her blood pressure spikes.  She      has asked that I send a letter to the facility where she lives to      request full-time in-home care as she thinks having someone in the      home with her will greatly help with this problem.  I will send      that letter in a separate document.   For follow-up I would like to see Ms. Tijerina back in 6 months.  I  would be happy to see her sooner if any cardiac problems arise.  Will  follow-up lipids in 6 weeks as she was recently started on Lipitor.     Veverly Mathews. Excell Seltzer, MD  Electronically Signed    MDC/MedQ  DD: 09/11/2007  DT: 09/11/2007  Job #: [id number]   cc:   Barbette Hair. Artist Pais, DO

## 2011-01-15 NOTE — Discharge Summary (Signed)
NAME:  [name], [name]             ACCOUNT NO.:  [id number]   MEDICAL RECORD NO.:  [id number]          PATIENT TYPE:  INP   LOCATION:  5009                         FACILITY:  [facility]   PHYSICIAN:  Kerrin Champagne, M.D.   DATE OF BIRTH:  Dec 01, 1945   DATE OF ADMISSION:  12/22/2008  DATE OF DISCHARGE:  12/24/2008                               DISCHARGE SUMMARY   ADMISSION DIAGNOSES:  1. 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  2. Fibromyalgia.  3. Irritable bowel syndrome.  4. Anxiety and depression.  5. Gastroesophageal reflux disease.  6. Epilepsy.  7. Migraine headaches.  8. Hypertension.  9. Diet-controlled diabetes mellitus.   DISCHARGE DIAGNOSES:  1. 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  2. Fibromyalgia.  3. Irritable bowel syndrome.  4. Anxiety and depression.  5. Gastroesophageal reflux disease.  6. Epilepsy.  7. Migraine headaches.  8. Hypertension.  9. Diet-controlled diabetes mellitus.   PROCEDURE:  On December 22, 2008 the patient underwent  1. Bilateral C5-6 [place] foraminotomies.  2. Right iliac crest bone graft harvest posterior fusion C6-7 using      triple wire technique performed by Dr. Otelia Sergeant assisted by Maud Deed, [facility] under general anesthesia.   CONSULTATIONS:  None.   BRIEF HISTORY:  The patient is a 65 year old female with status post two-  level anterior cervical diskectomy and fusion at C5-6 and C6-7 in 2005.  She did well for a short time following surgery but has now presented  with recurrence of neck pain and arm pain.  EMG and nerve conduction  studies suggests C6 radiculopathy.  Radiographically, she has signs of  nonunion at the anterior diskectomy and fusion site at C6-7 with  solid  fusion at C5-6.  MRI and CT studies have indicated severe biforaminal  stenosis associated with cervical spondylosis impressing on both C6  nerve roots.  It was felt that she would benefit from surgical  intervention and was admitted for the procedure as stated above.   BRIEF HOSPITAL COURSE:  The patient tolerated the procedure under  general anesthesia without complications.  Postoperatively,  neurovascular and motor function in the upper extremities was noted to  be intact.  Her arm pain did improve.  She utilized PCA analgesics  initially and was weaned to p.o. analgesics.  She did have some mild  drainage from the posterior iliac crest bone graft harvest site.  Dressing changes were done on the second postoperative day.  The patient  was instructed in dressing changes.  The neck dressing was dry and  clean.  The patient was able to take a regular diet.  She was voiding.  She was ambulating in the hallway and discharged on her second postop  day at which time she was afebrile and vital signs were stable.  Admission labs  showed CBC within normal limits with exception of WBC  12.8.  Coagulation studies normal.  Chemistry studies normal with  exception of glucose 103.  Urinalysis within normal limits as well.  Blood sugars were followed regularly with the highest value being 266  and lowest value being 106.   PLAN:  The patient was discharged to her home with instructions to  resume home medications as taken prior to admission.  She was given  morphine ER 20 mg one every 12 hours and Percocet 5/325 one to two every  4-6 hours as needed for breakthrough pain.  She will utilize her soft  collar at all times.  Dressing change will be done daily or as needed to  both the posterior iliac crest graft site and the posterior neck wound.  She will use ice packs to her back and neck as needed.  She will keep  her wounds dry and clean at all times.  She was given incentive  spirometry to  use at home.  The patient was instructed in smoking  cessation.  She was instructed to try and minimize narcotic use when  possible.  The patient will follow up in 2 weeks from surgery with Dr.  Otelia Sergeant and will call to arrange the appointment.   CONDITION ON DISCHARGE:  Stable.      Wende Neighbors, P.A.      Kerrin Champagne, M.D.  Electronically Signed    SMV/MEDQ  D:  02/09/2009  T:  02/09/2009  Job:  [id number]

## 2011-01-15 NOTE — Assessment & Plan Note (Signed)
HISTORY OF PRESENT ILLNESS:  [name] follows up today.  She  continues to have some back pain.  She has had positive response x1 to  medial branch block done about 3 months ago now because of taking  antibiotics for mastitis.  She has not scheduled a second one.  In the  interval time, she has developed some pain in the right side of the  chest, sometimes going to the left side of the chest as well as into the  abdomen.  She had seen a cardiologist per her report and they do not  feel that this is cardiac in nature.  The patient has also had a  mammogram that looked okay and a chest x-ray that looked okay.   She has pain really all over her body and her pain diagram is basically  filled in.  The pain is described as sharp, burning, stabbing, constant  aching, and interferes with general activity at 8/10 level.  However,  with enjoyment of life at 4/10 level.  She can walk 5 minutes at a time.  She climbs steps, but not well.  She does not drive.  She walks without  assistance although sometimes she does use a cane.  She needs some  assist with meal prep, household duties, and shopping.  She has been on  disability since 2001 per her report.  She has bladder and bowel control  issues, but really this is more along the line of constipation in terms  of her bowel.   She has had some respiratory issues.  She continues to see her  psychiatrist, who is treating her for bipolar disorder with Seroquel 100  mg nightly.  Also, she is taking Tegretol 200 mg t.i.d., this is for  seizure disorder, but her psychiatrist prescribed it, is making wonder  whether it is being used for her bipolar as well.  She has been on  Lyrica 75 b.i.d.  I have not tried increasing his Senokot __________.   PAST MEDICAL HISTORY:  Significant for stomach problems, seizures,  cancer, arthritis, and high blood pressure.   SOCIAL HISTORY:  She is married, lives with her husband.  Smokes over a  pack a day.   FAMILY HISTORY:  Heart disease, lung disease, diabetes, high blood  pressure, and disability.   PHYSICAL EXAMINATION:  VITAL SIGNS:  Her blood pressures is 135/83,  pulse 88, respirations 18, O2 sat 95% on room air.  GENERAL:  No acute distress.  Mood and affect is appropriate.  Orientation x3.  Affect, she is alert.  Gait is normal.  CHEST:  Her chest has some tenderness to palpation along the  sternocostal margin, more on the left side than on the right side.  She  has positive bowel sounds.  HEART:  Regular rate and rhythm.  LUNGS:  Clear to auscultation.  ABDOMEN:  Nontender to palpation.  EXTREMITIES:  Her upper and lower extremity strength is normal.  Deep  tendon reflexes are normal.  The fibromyalgia tender points are positive  over bilateral upper trap, bilateral pullback, bilateral hip area, right  elbow, and left knee.   Sensation is intact.  Deep tendon reflexes are normal.  Coordination is  normal.  Extremities without edema.   IMPRESSION:  1. Fibromyalgia syndrome.  She is on subtherapeutic doses of Lyrica.      We will slowly bump this up.  We will start out with 150 b.i.d. x 1      month and then when  I see her back in followup, consider going up      to 225 b.i.d. depending on how she tolerates the side effects.  2. Costochondritis.  We will prescribe Voltaren gel to the area      q.i.d., she has to try the samples first.  3. Consider repeat medial branch blocks.      Erick Colace, M.D.  Electronically Signed     AEK/MedQ  D:  02/16/2008 16:14:00  T:  02/17/2008 06:10:37  Job #:  [id number]

## 2011-01-15 NOTE — Assessment & Plan Note (Signed)
DATE OF LAST VISIT:  November 20, 2007   DATE OF SERVICE TODAY:  December 18, 2007   [name] follows up today.  She is about two months post bilateral  L5 dorsal ramus injection, L4 medial branch block, and bilateral L3  medial branch block.  She ha had 50% relief with that injection, and was  to be scheduled for repeat injections.  However, she has had problems  with a mastitis on the right side.  She has had additional problems with  increasing nausea when she takes oxycodone.  She has been taking some  Phenergan to help with this.  Her Risperdal was switched.  Problem  persists.  Her pain is fairly stable at a 7-8/10 level, which has been  about where it was at approximately four months ago.  Pain interferes  with activity at a moderate level.  Her sleep is poor.  Pain is worse  with walking, bending, standing.  Relief from medications is fair, about  50%.  She can walk five minutes at a time.  She does not drive or climb  steps.  She needs some assistance with bathing, meal prep, household  duties and shopping.   REVIEW OF SYSTEMS:  Positive for numbness, tingling, trouble walking,  anxiety, respiratory problems, nausea, vomiting, diarrhea, abdominal  pain, skin rash.   MEDICATIONS:  Promethazine p.r.n.  She is now off Risperdal and  Tegretol.  She continues on Seroquel, omeprazole, metoprolol,  hyoscyamine, vitamins.   Pain medications include oxycodone 15 t.i.d. and Lyrica 75 b.i.d.  Dr.  Artist Pais still prescribes Lyrica.   EXAMINATION:  Blood pressure 131/60, pulse 80, respiration 20, O2  saturation 94% room air.  GENERAL:  No acute distress.  Mood and affect appropriate.  GAIT:  Shows no evidence of toe drag or knee instability.  She is able  to toe walk, heel walk.  Her lumbar spine range of motion:  75% forward flexion, but 50%  extension.  She has pain with extension.  Lower extremity strength is  4+, bilateral hip flexors, 5/5 bilateral knee extensors, ankle   dorsiflexors.  Sensation normal in lower extremities.  Deep tendon reflexes normal in  upper and lower extremities.  Upper extremity strength is normal.   IMPRESSION:  1. Lumbar facet syndrome.  2. Question whether she has more difficult pain syndrome since      fibromyalgia overlying her other problems.  She has been on Lyrica,      relatively small dose, and we will increase from b.i.d. to t.i.d.      75 mg dosing.  3. Given nausea with oxycodone, this likely opioid effect dosage      related, we will switch her to hydrocodone 7.5 one p.o. b.i.d. and      2 nightly #120 for a month.  Do not think she should have any      withdrawal type symptoms given her t.i.d. dosing status of      oxycodone really allowed it to wash out every evening.   I will see her back in one month.  Hold off on any injections until her  problems with her mastitis improve.  May need to continue increasing on  her Lyrica dose next month.      Erick Colace, M.D.  Electronically Signed     AEK/MedQ  D:  12/18/2007 16:30:48  T:  12/18/2007 18:30:41  Job #:  [id number]   cc:   Barbette Hair. Artist Pais, DO  [address]  [place], [place] [address]   Dr. Talmage Nap or Dr. Lurene Shadow

## 2011-01-15 NOTE — Assessment & Plan Note (Signed)
[name] returns today originally for EMG.  However, she states she  has another appointment in a half hour and therefore it does not give [place]  sufficient time to complete the study.  In the interval time since I  last saw her she has had MRI of the lumbar spine.  She has had no new  medical complications in the interval time.  She has done okay from  oxycodone.  She has reportedly done well with hydrocodone as well.   She is going to see Dr. Artist Pais for medical follow-up today.   PHYSICAL EXAMINATION:  She shows no low extremity weakness.  She has  some tenderness to palpation of the lumbar spine, upper lumbar area.  She has normal deep tendon reflexes.  She has normal strength and she  has normal gait.  Her MRI showed old compression fracture L1, loss of  height 40%, mild disc desiccation, bulging T12, L1, L2, L3, L4, L5 but  no disk herniation.   IMPRESSION:  1. Low back pain.  May be facet syndrome.  2. Upper extremity paresthesias.  Will need to reschedule EMG.  3. Urine drug screen is okay.  We can institute the oxycodone 15      b.i.d. rather than OxyContin.  May be able to taper her down to      hydrocodone next visit.  Will schedule for medical branch blocks,      more fully evaluate the etiology of her back pain.      Erick Colace, M.D.  Electronically Signed     AEK/MedQ  D:  09/01/2007 16:21:49  T:  09/01/2007 19:41:24  Job #:  [id number]   cc:   Barbette Hair. Lam[place], DO  [address] [place], [place] [address]

## 2011-01-15 NOTE — Procedures (Signed)
NAME:  [name], [name]             ACCOUNT NO.:  [id number]   MEDICAL RECORD NO.:  [id number]          PATIENT TYPE:  REC   LOCATION:  [facility]                          FACILITY:  [facility]   PHYSICIAN:  Erick Colace, M.D.DATE OF BIRTH:  [birth year]   DATE OF PROCEDURE:  10/26/2007  DATE OF DISCHARGE:                               OPERATIVE REPORT   PROCEDURE:  Bilateral L5 dorsal ramus injection, bilateral L4 medial  branch block, bilateral L3 medial branch block under fluoroscopic  guidance.   INDICATIONS:  Lumbar facet syndrome interfering with meal prep,  household duties, shopping, toileting, with pain rated at a 7/10 on  average and only partially responsive to medication management and other  conservative care.   Informed consent was obtained after describing risks and benefits of the  procedure to the patient to include bleeding, bruising, infection,  temporary or permanent paralysis.  She elected proceed and has given  written consent.   The patient placed prone on fluoroscopy table.  Betadine prep, sterile  drape.  A 25-gauge 1-1/2-inch needle was used to anesthetize skin and  subcutaneous tissue with 1% lidocaine x2 mL.  Then, a 22-gauge 3-1/2-  inch spinal needle was inserted under fluoroscopic guidance, first  targeting the S1 SAP sacral ala junction, bone contact made, confirmed  with lateral imaging.  Omnipaque 180 x 0.5 mL demonstrated no  intravascular uptake.  Then, 0.5 mL of a solution containing 1 mL of 4  mg/mL of dexamethasone and 2 mL of 1% MPF lidocaine were injected.  Then, the left L5 SAP transverse process junction targeted, bone contact  made, confirmed with lateral imaging.  Omnipaque 180 x 0.5 mL  demonstrated no intravascular uptake.  Then, 0.5 mL of the  dexamethasone/lidocaine solution was injected, and then the left L4 SAP  transverse process junction targeted, bone contact made, confirmed with  lateral imaging.  Omnipaque 180 x 0.5 mL demonstrated  no intravascular  uptake.  Then 0.5 mL of dexamethasone/lidocaine solution was injected.  This same procedure was repeated at corresponding levels on the right  side using same needle, injectate, and technique.  The patient tolerated  the procedure well.  Pre/injection pain level 5/10; post injection 4/10.  Will follow up with pain diagram.  I will see her back in 3-4 weeks for  follow-up visit.      Erick Colace, M.D.  Electronically Signed     AEK/MEDQ  D:  10/26/2007 12:52:17  T:  10/27/2007 07:48:03  Job:  [id number]

## 2011-01-15 NOTE — Assessment & Plan Note (Signed)
[facility]                            CARDIOLOGY OFFICE NOTE   [name], [name]                    MRN:          [id number]  DATE:07/05/2008                            DOB:          1946/07/29    REASON FOR VISIT:  Chest pain and coronary artery disease.   HISTORY OF PRESENT ILLNESS:  [name] is a 65 year old woman with  single-vessel CAD.  She has multiple chronic pain complaints but  underwent cardiac catheterization because of chest pain last year.  Her  catheterization dated August 21, 2007 demonstrated a moderate stenosis  of the second OM branch of the left circumflex.  Angiographically it  appeared 75% in a vessel that is of medium size.  Her other major  epicardial coronary arteries had no significant obstructive disease.  She was managed medically because of the small area of myocardium and in  the setting of atypical symptoms.  She has had a lot of difficulty  lately with shortness of breath, cough, and generalized weakness.  She  has been on multiple antibiotics for bronchopneumonia including  Levaquin, Zithromax, and doxycycline.  She has also been treated with  prednisone for reactive airway disease.  She continues to complain of  frequent chest pain as well as back pain.  She does not have typical  exertional chest pain.   MEDICATIONS:  1. Lipitor 20 mg daily.  2. Seroquel 100 mg daily.  3. Omega-3 fish oil t.i.d.  4. Lyrica 225 mg b.i.d.  5. Combivent 4 times daily.  6. Vicodin 500 mg b.i.d.  7. Azithromycin 250 mg daily.  8. Prednisone 10 mg t.i.d.   ALLERGIES:  PENICILLIN and METFORMIN.   PHYSICAL EXAMINATION:  GENERAL:  The patient is alert and oriented.  She  is an obese woman in no acute distress.  VITAL SIGNS:  Weights 216 pounds, blood pressure 116/60, heart rate is  88, and respiratory rate is 20.  HEENT:  Normal.  NECK:  Normal carotid upstrokes.  No bruits.  JVP normal.  LUNGS:  Coarse breath sounds  bilaterally.  There is mild wheezing.  No  rales.  HEART:  Regular rate and rhythm.  No murmurs or gallops.  ABDOMEN:  Soft, obese, and nontender.  No organomegaly.  EXTREMITIES:  No clubbing, cyanosis, or edema.  Peripheral pulses are  intact and equal throughout.   EKG shows normal sinus rhythm and is within normal limits.   ABIs from April 20, 2008 were normal at 0.98 on the right and 1.0 on  the left.  There were normal triphasic wave forms bilaterally.   ASSESSMENT:  1. Mild single vessel coronary artery disease.  I do not think the      patient's chest pain is related to ischemic heart disease.  I      think, she will be best served with continued medical therapy.  She      is tolerating atorvastatin well at present.  She should continue      with aspirin 81 mg daily and as well as Bystolic.  2. Hypertension.  Blood pressure well controlled on a  combination of      Azor and Bystolic.  3. Dyslipidemia.  Lipids from February showed cholesterol of 115,      triglycerides 145, HDL 35, and LDL 52.  Continue followup with      yearly lipids and LFTs.   For followup, I would like to see [name] back in 6 months.     Veverly Fells. Excell Seltzer, MD  Electronically Signed    MDC/MedQ  DD: 07/05/2008  DT: 07/06/2008  Job #: [id number]   cc:   Corwin Levins, MD  Erick Colace, M.D.

## 2011-01-15 NOTE — Assessment & Plan Note (Signed)
[facility]                            CARDIOLOGY OFFICE NOTE   [name], [name]                    MRN:          [id number]  DATE:08/12/2007                            DOB:          [birth year]    [name] was seen in followup at [facility] Office on  August 12, 2007.  [name] is a very nice 65 year old woman with  hypertension, dyslipidemia, and ongoing tobacco abuse.  She has a  history of chronic pain involving the abdomen and chest.  She has been  evaluated extensively.  She presents today with a complaint of ongoing  chest pain that occurs with activity.  She has a near-constant pain in  the epigastrium that is distinct from her chest pain.  She describes  chest pain that feels like a pressure on the left side of the chest.  She also has chronic exertional dyspnea.  Her chest pain occurs  intermittently with exertion.  At times, she is able to exert herself  with no symptoms.  She has no resting chest pain.  Her symptoms have  been present for several months.  She underwent an adenosine Myoview  study in March of this year and had shown normal LV contractility and no  sign of scar or ischemia.  Her LVEF was preserved at 68%.   She has had no palpitations, light-headedness, syncope, or edema.  She  has no other current complaints.   CURRENT MEDICATIONS:  1. Aspirin 81 mg daily.  2. Omega 3 fish oil.  3. Amlodipine 5 mg daily.  4. Risperdal.  5. Tegretol 200 mg t.i.d.  6. Zyrtec 10 mg as needed.  7. Clonazepam 0.5 mg twice daily.  8. Lyrica 75 mg twice daily.  9. Methocarbamol 1.5 daily.  10.Metoprolol succinate 150 mg daily.  11.Omeprazole 1 to two 20 mg daily.   EXAM:  The patient is alert and oriented.  She is in no acute distress.  Weight 205, blood pressure 129/77, heart rate 78, respiratory rate 16.  HEENT:  Normal.  NECK:  Normal carotid upstrokes without bruits.  Jugular venous pressure  is normal.  LUNGS:  Scattered rhonchi.  CARDIAC:  Regular rate and rhythm without murmurs or gallops.  Apex is  discrete and nondisplaced.  ABDOMEN:  Soft and nontender.  No organomegaly.  EXTREMITIES:  No cyanosis, clubbing, or edema.  Peripheral pulses 2+ and  equal throughout.   EKG shows normal sinus rhythm and is within normal limits.   ASSESSMENT:  [name] has chest pain with both typical and atypical  features.  Her normal Myoview study this spring was reassuring.  However, she continues to be bothered by chest pain that occurs with  exertion.  I think in the setting of her multiple risk factors that  include hypertension, dyslipidemia, and tobacco, it will be reasonable  to proceed with a cardiac cath for definitive diagnosis.  She and her  husband strongly favor this approach, as they have been quite anxious  about her pain.  She will be scheduled in the [facility] cath lab.  At that  time, I  will plan on abdominal aortography as well, as she has had  recent difficulty controlling blood pressure and has risk factors for  vascular disease that could effect her renal arteries.   In the interim, her medications are unchanged.  She has been closely  followed by Dr. Artist Mathews, and I have discussed her case with him over the  telephone.  We will make further recommendations based on the results of  her catheterization.     Teresa Fells. Excell Seltzer, MD  Electronically Signed    MDC/MedQ  DD: 08/12/2007  DT: 08/12/2007  Job #: [id number]   cc:   Teresa Hair. Artist Pais, DO

## 2011-01-15 NOTE — Assessment & Plan Note (Signed)
The patient returns today in the interval time.  She states that she has  seen Neurology as well as her orthopedic spine surgeon.  The neurologist  told her that she had some type of seizure, put her on Lamictal, but she  was taken off it.  He clarified that it may be migraines leading to  seizure or seizure leading to migraines, but I do not have the office  notes from the Neurology Clinic.  The patient states she has had  additional imaging studies performed.  She states that she has had a C-  spine MRI.  I do not have the results on that.   She has been started on trazodone.  She states that she has been under  lot of stress because of her husband having PTSD.   She has had no hospitalizations per her report.   Her medications from this office include Lyrica 225 mg p.o. b.i.d.,  hydrocodone 5/500 one p.o. t.i.d.  Her other medications include  Seroquel, and as noted above, recently started on trazodone.  She is  also on Klonopin and Tegretol.   Looking through e-chart, she did have an MRI, MRA of the head and neck,  December 4; however, interpretation not available to me.   Her pain level is 8/10, it interferes with activity at a 6-7/10 level.  Her pain diagram basically shows her whole body shaded in.  Pain is  sharp burning, numbing, stabbing, constant, tingling, aching.   REVIEW OF SYSTEMS:  She has had a rash.  At that time, she has seen a  dermatologist, who gave her some steroids and cream.  She states that it  pops up in different places, does not seem to go where her Voltaren gel  is.   Her walking tolerance is 5 minutes.  Her review of systems is positive  for anxiety, trouble walking due to pain, spasms, dizziness, diarrhea  alternating with constipation, urinary frequency.   SOCIAL HISTORY:  Married, lives with her husband, smokes.  Denies any  alcohol or drug abuse.   PHYSICAL EXAMINATION:  VITAL SIGNS:  Her blood pressure is 144/61, pulse  92, respirations 18,  O2 sat 94% on room air.  GENERAL:  In no acute distress.  Orientation x3.  Affect is alert.  EXTREMITIES:  Gait is normal.  Her upper extremities show no signs of  arthralgia.  She has a bruise on the back of her right arm in the  triceps region, but no other rashes are noted.  Her neck range of motion  is 75% forward flexion, extension, lateral rotation, and bending.  She  has tenderness to palpation bilateral upper trap as well as upper medial  scapular border.  Upper extremity and lower extremity strengths are  normal.  Deep tendon reflexes are normal.  Upper and lower extremity  range of motion are normal.  Gait shows no toe drag or knee instability.   IMPRESSION:  1. Widespread pain.  I believe, this mainly has to do with her      fibromyalgia.  She is under increased stress now.  Certainly, this      can all tie together.  2. Neck pain.  She indicates that she has had further imaging studies.      I do not have a copy of these.  The last thing I have is MRI of the      cervical spine, which was done on August 30th.  Certainly, she has  had a C5 through C7 fusion, but she did not have any adjacent level      degeneration noted and that is fairly recently.  She will follow up      with Dr. Otelia Sergeant on that.   PLAN:  Given that she is already on max dose to Lyrica, we will start  her on Cymbalta 30 mg a day.  She has been asked to stop her trazodone  to avoid serotonin-type syndrome.   In terms of rash, she will follow up with the dermatologist.  We did  discuss the possible additional workup should she have any arthralgias,  but she really does not have any clear arthralgias at the time she gets  the rash and pain.  Consider rheumatologic consult.  I do not think that  this is necessary at this time.   Reviewed some self-management techniques, heat for the neck as well as  laying down on a tennis ball to massage her upper trapezius area.   I will see her back in 1 month.  We  will get a urine drug screen today  to make sure she is compliant with her medication.      Erick Colace, M.D.  Electronically Signed     AEK/MedQ  D:  09/15/2008 16:23:03  T:  09/16/2008 05:39:42  Job #:  [id number]   cc:   Kerrin Champagne, M.D.  Fax: [phone]   Dr. Jonny Ruiz

## 2011-01-15 NOTE — Assessment & Plan Note (Signed)
Ms. Defilippo returns today.  She has had a right L5 dorsal ramus  radiofrequency neurotomy, right L4 medial branch radiofrequency  neurotomy, right L3 medial branch radiofrequency neurotomy under sacral  and lumbar fluoroscopy guidance on June 02, 2008.  She had no  procedure complications.  Her biggest complaint right now is that she  had a fall this morning injuring her left ankle.  She did not hear a  crack or anything.  She feels like over the last couple months, she has  had some episodes where her legs have given out.  This started back even  prior to the procedure.  She had some facial numbness.  I asked her to  see her primary care physician and perhaps get set up with Neurology.  She has seen Dr. Kelli Hope from Neurology.  She is having MRI of  the brain per her report.   She has had some numbness in the left upper extremity, but this went  back as far as January of this year with an EMG performed by myself that  shows some chronic denervation at C5-C6 nerve roots, but really no  median nerve or ulnar nerve neuropathy.   Her most recent MRI of the cervical spine which was on May 01, 2008,  really showed no compressive lesions.  Her last MRI of the lumbar spine  was in December 2008, which showed an old compression fracture of L1,  mild disk bulging at multiple levels, but no apparent stenosis.  We did  not see any thoracic spine MRI reported in the PACS system.   MEDICATIONS:  1. Voltaren gel.  2. Hydrocodone 5/500 one p.o. b.i.d.  3. Lyrica 225 b.i.d.   PHYSICAL EXAMINATION:  GENERAL:  No acute distress.  Mood and affect,  she is anxious.  MUSCULOSKELETAL:  When she weight bears, she has quite a bit of pain in  her left ankle.  She has lateral soft tissue swelling in the left ankle.  She has no ecchymosis.  She has mild pain with passive inversion.  No  pain with eversion.  No pain with ankle dorsiflexion.  No joint line  tenderness, only mild tenderness to  palpation over the distal fibula.  She has good ankle dorsiflexion and plantar flexion.  Her pulses are  symmetric bilaterally.  Motor strength is normal in the lower extremities.   1. X-ray of the left ankle, 3 views, negative for fracture.  Did show      some soft tissue swelling of lateral ankle.  2. Gait and balance.  Lower extremity weakness, which is subjective      and intermittent.  I do not think it is due to a lumbar spine      problem.  She is seeing Neurology.  We would consider a T-spine MRI      plus brain.   I will see her back in a couple months.  She is seeing both Spine  Surgery and Neurology.  I will up her hydrocodone to t.i.d.  Continue  her Lyrica 225 b.i.d.      Erick Colace, M.D.  Electronically Signed     AEK/MedQ  D:  07/21/2008 17:04:17  T:  07/22/2008 05:12:36  Job #:  [id number]
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
## 2011-01-15 NOTE — Assessment & Plan Note (Signed)
[name] follows up today.  She was last seen by me on March 15, 2008.  She has continued low back pain.  She had good results, greater  than 50% relief after medial branch block done in February but could not  repeat it, because she has been on antibiotics for various recurrent  infections such as mastitis and UTI.  She is basically off them, but she  is just on some prophylactic nitrofurantoin, postcoital for UTI  prophylaxis.  She has had no fever or chills.  She continues to have  back pain, and this is one of her major complaints.  In addition, she  has some pain going down to her right arm and some numbness in the first  dorsal web space of the right hand.   In addition, she has had several episodes where she feels some numbness  in left side of her face, left shoulder and hand.  This is not  accompanied by any neck pain.  This lasts few minutes and then goes  away.  She wonders whether it could be any of her medications.   Her tongue also feels a bit numb with this as well.  Her pain levels are  on 7/10 on average, interferes with activity at a 7/10 level, and  enjoyment of life at a 5/10 level.  Pain is worse with walking, bending,  and standing.  Pain is intermittent, sharp, burning, stabbing, aching,  improves with medication at times and pacing activity.  She can walk 5  minutes at time.  She really does not climb steps much.  She has been on  disability since 2001.  She needs some help with bathing, meal prep,  household duties, and shopping.   REVIEW OF SYSTEMS:  Positive for bladder problems, chronic numbness,  tremor, tingling, trouble walking, spasms, dizziness, night sweats,  blood sugar problems, nausea, urine problems, coughing, wheezing, and  abdominal pain.   Primary physician is Dr. Jonny Ruiz.  The patient also sees Dr. Allyne Gee.  The  patient also sees Dr. Otelia Sergeant [facility].   SOCIAL HISTORY:  Lives with her husband.  Smokes cigarettes.   Denies any  alcohol or illegal drug use.   FAMILY HISTORY:  Heart disease, lung disease, diabetes, high blood  pressures, and psychiatric problems.   Her last prescription was for hydrocodone 5/500 one p.o. b.i.d.  This  has been helpful for her pain.  She has none left from the prescription  from 1 month ago.   Low back has some tenderness in lumbar paraspinal.  She has good forward  flexion, but limited spinal extension.  Motor strength is full in  bilateral lower extremities.  Lower extremity range of motion is normal.  Deep tendon reflexes are normal.  Lower extremity sensation is normal as  well.   IMPRESSION:  1. Lumbar pain with lumbar spondylosis without myelopathy.  We will do      another set of medial branch blocks and at least temporarily      helpful by greater than 50% pain relief, would set her up for a      radiofrequency.  2. Intermittent left-sided face and arm numbness.  She has no history      of CVA.  She is hypertensive, and she has fibromyalgia as well as      bipolar affective disorder.  I asked her to call her family      physician with these complaints.  I did do  a neuro exam on her,      really nothing in terms of neuro exam.  She has cranial nerves II      through XII intact but certainly, transient ischemic attack could      be a consideration.  3. Left hand numbness.  She has a EMG/NCV that I performed several      months ago, 09/24/2007, which showed no evidence of carpal tunnel      or ulnar neuropathy.  She did have some denervation noted in the      right biceps but this looked rather chronic in nature.  She does      have some numbness in the C6 distribution.  Does not appear      particularly progressive.  She does plan to see Dr. Otelia Sergeant for this.      I will send a copy of the EMG to him for his review.   I discussed this with the patient.  She is in agreement with this plan.  We will continue hydrocodone 5/500 one p.o. b.i.d.   1. We will  try reducing Lyrica 150 b.i.d. to see if this helps with      any of her left-sided numbness episodes.      Erick Colace, M.D.  Electronically Signed     AEK/MedQ  D:  04/15/2008 13:43:47  T:  04/16/2008 04:02:08  Job #:  [id number]   cc:   Corwin Levins, MD  [address]  [place]  [place] [address]   Kerrin Champagne, M.D.  Fax: [phone]

## 2011-01-15 NOTE — Assessment & Plan Note (Signed)
DATE OF LAST VISIT:  10/26/2007   Ms. Hopwood follows up today. She was seen last approximately 1 month  ago for bilateral L5 dorsal ramus injection, bilateral L4 medial branch  block, bilateral L3 medial branch block under fluoroscopic guidance.  She had a 50% relief of pain lasting 2-3 weeks.   She had no post injection complication. She has had gradual recurrence  of pain.  Sleep is poor.  She uses a cane at times, needs some  assistance bathing, meal prep, household duties, shopping.   REVIEW OF SYSTEMS:  Positive for bladder and bowel problems, numbness  and tingling, trouble walking, dizziness, anxiety, coughing, and weight  gain, fever.   INTERVAL MEDICAL HISTORY:  She has been treated for what sounds like a  left breast mastitis on antibiotics at the current time.   EXAMINATION:  General:  In no acute distress, mood and affect  appropriate.  Back:  Mild tenderness to palpation at the lumbosacral  junction.  She has 75% forward flexion but only 50% extension  accompanied by pain with extension.  Lower extremity strength 4/5  bilateral hip flexors, 5/5 bilateral knee extensors, 5/5 bilateral ankle  dorsiflexion.  Sensation is normal in the lower extremities.  Deep  tendon reflexes are normal in the upper and lower extremities.  Upper  extremity strength is normal.   IMPRESSION:  1. Lumbar facet syndrome as demonstrated by medial branch block x1.      Would need to do confirmatory x1 given 20% false positive rate.  If      confirmatory injection is helpful to a 50% degree, would schedule      for lumbar radiofrequency neurotomies.  2. 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  3. Hand numbness related to her cervical denervation.   PLAN:  1. Will continue oxycodone 15 t.i.d.  2. I will see her back in 4 weeks and see if she can be scheduled for      the injections at that time if her infection has been treated.      Erick Colace, M.D.  Electronically Signed     AEK/MedQ  D:  11/20/2007 14:11:13  T:  11/20/2007 14:45:59  Job #:  [id number]   cc:   Barbette Hair. Keyes, DO  [address] [place], [place] [address]

## 2011-01-15 NOTE — Op Note (Signed)
NAME:  [name], [name]             ACCOUNT NO.:  [id number]   MEDICAL RECORD NO.:  [id number]          PATIENT TYPE:  INP   LOCATION:  5009                         FACILITY:  [facility]   PHYSICIAN:  Kerrin Champagne, M.D.   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   PROCEDURES:  1. Bilateral C5-6 [place] foraminotomies.  2. Right iliac crest bone graft harvest, posterior fusion C6-7 using      triple wire technique.   SURGEON:  Kerrin Champagne, MD   ASSISTANT:  Wende Neighbors, PA   ANESTHESIA:  General via oral tracheal intubation, Dr. Jacklynn Bue.  Local  infiltration, posterior neck, right iliac crest bone graft harvest site  using Marcaine, 0.5% with 1:200,000 epinephrine 20 mL.   DRAINS:  Foley to straight drain.   CLINICAL HISTORY:  This patient is a 65 year old female who has been  experiencing neck pain radiation into her arms.  She underwent two level  anterior cervical diskectomy and fusion in 2005 for cervical disk  herniation and foraminal entrapment.  Following surgery, she did well  for a period of time with persistent pain in her neck, radiation to her  arms.  She has been in pain management for some time.  Her pain is  worsened to the point where she is having severe central neck pain and  radiation to both shoulders, radiation into her arms.  EMGs nerve  conduction study suggest C6 radiculopathy.  She has radiographic signs  of nonunion at the anterior diskectomy and fusion site at C6-7 and solid  fusion at C5-6.   Plates and screws appeared to be in adequate position  alignment.  She was brought to the operating room in order to undergo  posterior cervical fusion at C6-7 or nonunion and as her studies  demonstrate significant foraminal entrapment C5-6 level of her fusion,  she is to undergo biforaminal decompressions posteriorly.   INTRAOPERATIVE FINDINGS:  Severe biforaminal stenosis associated with  cervical spondylosis changes impressing on both C6 nerve roots.  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   DESCRIPTION OF PROCEDURE:  After adequate general anesthesia, Foley  catheter was placed.  The patient was then turned to prone position,  chest rolls were used.  The head on a well-padded [place] horseshoe.  All pressure points were well padded.  The eyes examined to ensure no  pressure on the orbits.  The neck in slight flexion.  The shoulders  taped downward in order to allow for visualization of the inferior  aspect of the patient's spine.  PAS stockings, both lower extremities to  try and diminish the risk of deep venous thrombosis.  The arms at this  site held in place with a wrap over the back and  padding over both  axillary areas and over the antecubital areas.  The skids were placed to  ensure the arms do not fall forward.   The posterior neck and right iliac crest posteriorly were then prepped  with DuraPrep solution and draped in the usual manner.  Iodine Vi-Drapes  were used.  Standard preoperative antibiotics of vancomycin for a  PENICILLIN allergy.  The patient also described an allergy to LATEX, so  all latex free gloves and materials were used throughout the case.  Standard preoperative protocol in the preop holding area, marking of the  right iliac crest for its incision site and sidedness, as well as  marking of the neck at C5-6 for foraminotomy, C6-7 for fusion  posteriorly.  Intraoperatively, time-out was undertaken and the patient  identified, as well as participants in surgery, surgeons, and   assistants, any concerns regarding the patient at standard time-out  protocol.  The patient then had infiltration of the skin overlying the  expected C6-7 level, an incision made in the midline of the posterior  aspect of the cervical spine longitudinally extending from about C4  through C7.  Through the skin and subcutaneous layers, bleeders  controlled using electrocautery down to the the patient's ligamentum  nuchae.  The incision then was carried over both sides of the expected  spinous process of C4, C5, C6, and C7.  The paracervical muscles were  then carefully elevated off the posterior lateral aspects of the lamina  of C5, C6, and C7 bilaterally.  I then incised at their attachments to  the lower ends of the lamina at each level on each side.  Bipolar  electrocautery and monopolar electrocautery used to control bleeders.  Clamps were then placed, the clamp beneath the spinous process of L4 and  a clamp above it.  An intraoperative lateral C-arm was then used to  identify the level.  The expected C4 level was then marked with a single  suture and then C5 was similarly marked.  This is for continued  identification throughout the case.  Boss McCullough retractor with  articulated elbow was then placed in order to expose the posterior  aspect of the cervical spine at C5-6 and C6-7 posteriorly.  Loop  magnification and headlamp was used throughout the entire case.  Initial  procedure performed was bilateral foraminotomies, then a high-speed bur  was used to remove bone from the lateral aspect of the lamina of C5  bilaterally and the medial 50% of the inferior articular process of C5  was resected bilaterally exposing the superior articular process of C6  bilaterally.  A 1-mm Kerrison was then used to enter the spinal canal  and to remove the medial aspect of the C6 facet.  This was done very  carefully over the superior aspect of the lamina at C6 and debridement  of the superior  articular process of C6 was carried out from medial to  lateral freeing up the ligamentum flavum at the C5-6 level bilaterally.  Ligamentum flavum was then carefully elevated off of the posterior  aspect of the thecal sac.  Epidural veins identified and the ligamentum  flavum was then resected using 2-mm Kerrisons.  Next, the facet was then  undercut bilaterally at the C5-6 level and the C6 nerve root identified  as it proceeded through the neural foramen.  Large amount of spur off of  the superior articular process of C6 and reflected ligamentum flavum  appeared to be impressing on the superior aspect  of the C6 nerve root  bilaterally.  This portion of the ligamentum flavum was resected and is  entirely freeing up the C6 nerve root as it exited.  On the left side in  particular, the C6 nerve root was decompressed over an area of almost 2  cm from its takeoff from the thecal sac.  The neurovascular leash was  identified.  This was contiguous with epidural veins over the lateral  aspect in the posterior portion of the thecal sac.  The titanium nerve  hook was used to elevate the epidural veins.  They were cauterized using  bipolar electrocautery set at low rating of 15-20 and then divided using  1-mm Kerrison out into the foramen unleashing the C6 nerve root and  allowing it to retract posteriorly from uncovertebral spurs anteriorly.  This was done bilaterally and bleeders were controlled using both  electrocautery, thrombin-soaked Gelfoam, and pledgets.  The bleeding was  controlled and the lateral aspect of the pedicle of C6 identified within  the neural foramen.  It was felt that the C6 nerve roots had been well  decompressed bilaterally.  The C5-6 level anteriorly was fused and was  felt at this level was quite stable.  A towel clip was then made used to  make a hole at the base of the spinous process of C6 and C7 from side-to-  side.  Care taken not to enter too deeply to avoid any  thecal sac or  durotomy.  These holes later have 22 gauge wires placed.  An 18 gauge  wire was then carefully wrapped below the spinous process of C6 and then  above the spinous process of C6 from both sides using 18 gauge wire.  The loop of the wire on the left side was then passed beneath the  spinous process of C7 to engage the opposite loop along the lateral  aspect of the C7 spinous process.  This was then twisted on itself.  Care was taken to take up a slack on the left side of the spinous  processes.  Then, using a wire twister, an 18 gauge wire was twisted  performing interspinous wire fixation of C6-C7.  This was quite stable  as the wire was intact.  A 22 gauge wire was then passed through the  holes made at the base of the spinous process of C6-C7 carefully  ensuring that the 22 gauge wire did not cause durotomy.  Next, bone  graft was harvested from the right posterior iliac crest through a  separate incision approximately 5 cm in length along the superior,  posterior, and medial aspect of the right iliac crest.  Incision through  skin and subcutaneous layers.  Blunt dissection through the deeper  subcutaneous layers to palpate the iliac crest.  The patient's depth at  this area was quite deep, almost 3 inches of fat between the skin and  the posterior iliac crest, so that a second set of McCullough retractors  were used to identify and expose the right posterior iliac crest and  incision was made into the gluteal muscles as they attached to the  superior lateral rim of the iliac crest posteriorly.  Cobb used to  perform subperiosteal dissection exposing the posterior iliac crest near  its posterior superior iliac spine.  Osteotomes were then used to make a  window in the outer cortex of bone measuring approximately 3 cm x 3 cm.  This was then lifted off of the inner table area cancellous  bone using  curved 0.5-inch osteotomes.  This was then cut to fit along the sides of  the  spinous process of C6 and C7 with the cancellous layer deep.  Additional cancellous bone from the inner table was obtained using  curettage.  There was not significant bleeding from the iliac crest bone  graft harvest site following this, so that the irrigation was carried  out and the deep fascial layers were then closed with interrupted #1  Vicryl sutures.  The subcutaneous layers with interrupted #1 and 0  Vicryl sutures, more superficial layers with interrupted 2-0 Vicryl  sutures and skin closed with a running subcu stitch of 4-0 Vicryl.  Bone  graft that was harvested had holes placed in strut grafts that were to  be placed along the spinous process of C6-C7 hole at both ends.  A 22-  gauge wires that have passed through the spinous processes were then  passed through the holes of the struts on this side and then these wires  were twisted on each other, two to a side, thus twisting so the graft  was carefully tightened down against the lateral aspect of spinous  process of C6 and C7.  Note that prior to this and prior to placement of  the graft, decortication of the lateral aspect of spinous process and  lamina of C6 and C7 was carried out using a 3-mm cutting bur.  Additional cancellous bone graft that been harvested from the iliac  crest bone graft harvest site were placed in the posterior aspect of the  lamina extending from C6-C7.  Then, the strut graft carefully tightened  down with twisting of the 22-gauge wires on each side.  When this was  completed, the wires that were twisted were carefully turned on  themselves and on to the graft to avoid any impingement on soft tissues  laterally.  Wire twists for the interspinous process was twisted into  the space between the C6-C7 spinous processes to avoid any protrusion of  her soft tissue irritation.  This completed, irrigation was carried out.  There was no active bleeding present.  Gelfoam was removed from the  foraminotomy sites  at C5-6 above and again no active bleeding found to  be present.  Soft tissues were allowed to fall into place and the [facility] retractor was removed.  The ligamentum nuchae was then  reapproximated in the midline with interrupted #1 Ethibond sutures.  Deep subcu layers approximated with interrupted 0 and 2-0 Vicryl  sutures.  Skin closed with a running subcuticular stitch of 4-0 Vicryl.  Note that this was done in interrupted fashion.  Dermabond was applied  and Mepilex bandage.  Additional Mepilex was placed over the right iliac  crest bone graft harvest site following application of Dermabond.  This  completed surgical procedure.  [place] collar was applied.  The  patient was then returned to a supine position, reactivated, extubated,  and returned to recovery room in satisfactory condition.  All instrument  and sponge counts were correct.      Kerrin Champagne, M.D.  Electronically Signed     JEN/MEDQ  D:  12/23/2008  T:  12/24/2008  Job:  [id number]

## 2011-01-15 NOTE — Assessment & Plan Note (Signed)
Ms. Curington follows up today.  She has had positive response x1 for  medial branch block done 2 months ago.  She has had problems with  mastitis and has been on antibiotics for this and continues on them as  well.  She has had some widespread body aches averaging a 7-8 out of 10  pain, burning, stabbing, constant, and aching.  It interferes with  activity at an 8 out of 10 level, enjoyment of life at a 5 out of 10.  Sleep is poor.  The pain is worse with walking, bending, sitting,  standing.  She can climb steps but not a lot of them.  She can walk 5  minutes at a time.  She no longer drives.  She needs assistance with  bathing, meal prep, household duties, and shopping.  She has some  irritable bowel-type syndrome with nausea, diarrhea, and constipation  alternating.  She has pain with urination.  She has postcoital pain and  is being treated for this by her other physicians, Dr. Artist Pais and Dr.  Lelon Perla.   She thinks her Lyrica is helping her for her body aches, cannot tolerate  the higher dose, though.   PHYSICAL EXAMINATION:  GENERAL:  In no acute distress.  Mood and affect  are flat and anxious.  Gait is normal.  She is able to toe-walk and heel-  walk.  She has good lower extremity strength, normal lower extremity  range of motion.  Palpation of five fibromyalgia tender points really  just painful in the lumbosacral junction.   IMPRESSION:  1. Lumbar facet syndrome with lumbar spinal spondylosis without      myelopathy.  2. Myofascial pain syndrome versus fibromyalgia syndrome.  3. Nausea with oxycodone, has been able to switch to hydrocodone 7.5      one p.o. b.i.d. and two q.h.s.  The 7.5 does not quite relieve her      pain.  Will bump her up to 10/325, but just take one three times a      day.  I will see her back in about one month.  She has indicated      problems with her current pharmacy and is switching pharmacies.      This is fine, but I will ask the nurses to do a  pharmacy check and      see what issues have gone on.      Erick Colace, M.D.  Electronically Signed     AEK/MedQ  D:  01/18/2008 16:48:42  T:  01/18/2008 16:10:96  Job #:  [id number]

## 2011-01-15 NOTE — Assessment & Plan Note (Signed)
HISTORY OF PRESENT ILLNESS:  Ms. Hinde follows up today.  She was  last seen by me on February 16, 2008.  She continues to have low back pain;  has had prior resultant medial branch block x1 done 3 months ago but  really has not been able to repeat the injection because of antibiotics  use for various recurrent infections such as mastitis and UTI.  She has  had improvement in her musculoskeletal chest pain after I started her on  Voltaren gel.  She has put some on her knee and this seems to help her  knee as well.   We did increase her fibromyalgia medication, i.e. Lyrica to 150 mg  b.i.d.  She has not had any side effects from it but does not note any  significant improvement in fibromyalgia pain either.   She has had no new medical problems in the interval time.   Currently, she does not take any hydrocodone.  She had been on this  couple of months ago.  We tried taking her off that, she reported some  headaches taking the 10 mg dosage.   PHYSICAL EXAMINATION:  GENERAL:  No acute distress.  Mood and affect  appropriate.   Her fibromyalgia tender points have positive bilateral upper traps,  right hip, and bilateral lower back area; negative at the knees and  elbows.  Negative at the sternocostal.   Her lumbar range of motion is basically 0-25 with extension and 50% of  flexion.  Lower extremity strength is normal.  Lower extremity range of  motion is normal.   IMPRESSION:  1. Lumbar pain, probable lumbar spondylosis and facet syndrome.  2. Fibromyalgia syndrome.  We will increase her Lyrica to 225 b.i.d.      Since we are not able to do the lumbar injection at this time, we      will start on low-dose hydrocodone 5/500 one p.o. b.i.d.  Continue      Voltaren gel for costochondritis.      Erick Colace, M.D.  Electronically Signed     AEK/MedQ  D:  03/15/2008 17:16:12  T:  03/16/2008 06:49:13  Job #:  [id number]

## 2011-01-15 NOTE — Letter (Signed)
September 11, 2007    [name]  [address]  [place], [place] [address]   RE:  [name], [name]  MRN:  [id number]  /  DOB:  12-13-1945   To Whom it May Concern:   [name] is a 65 year old patient who I follow for coronary  artery disease.  Ms. Colunga has requested a letter regarding her home  situation.  She has recently suffered from a great deal of anxiety which  leads to blood pressure spikes as well as chest pain.  I believe she  would benefit from full-time in-home care that she informs me will be  provided by her husband, Meyer Dockery.  My hope is that this will  improve her anxiety level and lead to fewer long-term problems related  to her hypertension and coronary artery disease.   If there are any questions regarding this matter please feel free to  contact me at [facility], [phone].   Thank you for your consideration in this matter.    Sincerely,      Veverly Fells. Excell Seltzer, MD  Electronically Signed    MDC/MedQ  DD: 09/11/2007  DT: 09/11/2007  Job #: [id number]

## 2011-01-15 NOTE — Cardiovascular Report (Signed)
NAME:  ROBENA, EWY             ACCOUNT NO.:  [id number]   MEDICAL RECORD NO.:  [id number]          PATIENT TYPE:  OIB   LOCATION:  1961                         FACILITY:  [facility]   PHYSICIAN:  Veverly Fells. Excell Seltzer, MD  DATE OF BIRTH:  1946-07-17   DATE OF PROCEDURE:  08/21/2007  DATE OF DISCHARGE:  08/21/2007                            CARDIAC CATHETERIZATION   PROCEDURE:  Left heart catheterization, selective coronary angiography,  left ventricular angiography.   INDICATION:  Ms. Lombardozzi is a 65 year old woman with ongoing problems  of chest pain.  She was evaluated early in the year and underwent a  Myoview stress study that was normal.  She has continued to have a great  deal of chest pain, and in the setting of multiple cardiac risk factors  we decided to proceed with a cardiac catheterization to exclude  obstructive CAD.   Risks and indications of the procedure were reviewed with the patient.  Informed consent was obtained.  The right groin was prepped, draped,  anesthetized with 1% lidocaine.  Using a modified Seldinger technique, a  4-French sheath was placed in the right femoral artery.  Standard 4-  French Judkins catheters were used for coronary angiography.  A 4-French  angled pigtail catheter was used for left ventriculography.  The patient  tolerated the procedure well and had no immediate complications.   FINDINGS:  Aortic pressure 110/54 with a mean of 78, left ventricular  pressure was 112/13.   The left mainstem is a relatively short segment.  The left main is  angiographically normal.  It bifurcates into the LAD and left  circumflex.   The LAD is a large-caliber vessel that has mild nonobstructive plaque in  its proximal aspect prior to the first septal perforator.  There is a  20% to 30% stenosis in that region.  The LAD then courses down and wraps  around the LV apex.  There is a small first diagonal branch present.  There are no other significant diagonal  branches.  The remaining  portions of the mid and distal LAD have no significant obstructive  disease.   The left circumflex is a large-caliber vessel.  There is a tiny  intermediate branch present.  The circumflex has mild nonobstructive  plaque in its proximal aspect.  There is a second OM branch that has a  focal 75% stenosis.  This is a medium-caliber vessel.  Following this  branch there is a left posterolateral branch that is widely patent.  There is no other significant angiographic stenosis in the left  circumflex system.   The right coronary artery is relatively small.  It is a dominant vessel.  There is minor nonobstructive plaque in the proximal aspect with no  greater than 20% to 30% stenosis.  The vessel supplies a large acute  marginal branch that is widely patent.  There is a small PDA branch and  2 small posterolateral branches that arise from the right coronary.  The  branch vessels have no significant angiographic stenosis.   Left ventricular function is normal.  The LVEF is 60%.  There is no  significant mitral regurgitation.   ASSESSMENT:  1. Single-vessel coronary artery disease with focal stenosis of the      second OM branch of the left circumflex.  2. Mild nonobstructive left anterior descending and right coronary      artery stenosis.  3. Normal left ventricular function.   DISCUSSION:  Ms. Croson has a moderately tight stenosis involving a  branch vessel of the left circumflex.  In the setting of her normal  Myoview study and the relatively small area of myocardium supplied I  think she will respond well to continued medical therapy.  It would be  unlikely that this area is causing all of her problems with chest pain.  Much of her chest pain syndrome is at rest and is likely unrelated to  this.  She should continue with aggressive secondary risk reduction.  We  will follow her up closely and if she develops compelling exertional  angina then PCI could  be considered.      Veverly Fells. Excell Seltzer, MD  Electronically Signed     MDC/MEDQ  D:  08/21/2007  T:  08/21/2007  Job:  [id number]   cc:   Barbette Hair. Artist Pais, DO
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
## 2011-01-15 NOTE — Assessment & Plan Note (Signed)
The patient returns today with a chief complaint of neck pain.  She has  a history of C5-C7 fusion.  She has had reevaluation with Dr. Otelia Sergeant.  No  surgical intervention planned.   She has had headaches, which she feels may be radiating from her neck.  She has pain behind her eye bilaterally.  She feels like she has a hump  on her neck.   Her current pain medication includes hydrocodone 7.5/325 one p.o. t.i.d.  However, she does not feel that this is helping her pain particularly.   PHYSICAL EXAMINATION:  NECK:  Her neck has good range of motion.  She  has some fullness over the C7.  Appears to be adipose.  No evidence of  lipoma.  EXTREMITIES:  Motor strength is 5/5 bilateral upper and lower  extremities.  Deep tendon reflexes normal.  Upper and lower extremity  range of motion normal.  Gait shows no evidence of toe drag or knee  instability.   IMPRESSION:  1. Neck pain.  She has cervical postlaminectomy syndrome.  I sent her      to Physical Therapy for trial traction as well as some myofascial      release.  2. Consider cervical medial branch blocks should the PT not be      particularly helpful for her.  3. In terms of her chronic postoperative pain, we will switch her off      the hydrocodone and put on MS Contin CR 15 mg p.o. b.i.d.  I will      see her back in 1 month.      Erick Colace, M.D.  Electronically Signed     AEK/MedQ  D:  10/14/2008 17:07:29  T:  10/15/2008 04:52:54  Job #:  [id number]

## 2011-01-15 NOTE — Assessment & Plan Note (Signed)
[facility]                         GASTROENTEROLOGY OFFICE NOTE   ISMA, TIETJE                    MRN:          [id number]  DATE:06/08/2007                            DOB:          [birth year]    PROBLEM:  Abdominal pain.   [name] has returned again complaining of abdominal pain.  Pain is  mostly in the right upper quadrant but may radiate to the left upper  quadrant and through to the back.  It is fairly constant, though  invariably worsened postprandially.  She has had no nausea.  She does  have loose stools associated with her pain.  There has been no change in  medications.  She does take Oxycodone twice a day but has breakthrough  abdominal discomfort.  ERCP in May 2006 demonstrated mild dilatation of  the common bile duct and common hepatic ducts.  No stones were seen.  She is status post cholecystectomy.   MEDICATIONS:  Omeprazole, metoprolol, baby aspirin, amlodipine,  Risperdal, Tegretol, Benicar, Zyrtec, clonazepam, and Lyrica.   PHYSICAL EXAMINATION:  Pulse 70, blood pressure 124/72, weight 207.  HEENT: EOMI.  PERRLA.  Sclerae are anicteric.  Conjunctivae are pink.  NECK:  Supple without thyromegaly, adenopathy or carotid bruits.  CHEST:  Clear to auscultation and percussion without adventitious  sounds.  CARDIAC:  Regular rhythm; normal S1 S2.  There are no murmurs, gallops  or rubs.  ABDOMEN:  There is mild right upper quadrant tenderness in the right  subcostal area.  There is no guarding, no rebound.  There are no  abdominal masses or organomegaly.  EXTREMITIES:  Full range of motion.  No cyanosis, clubbing or edema.  RECTAL:  Deferred.   IMPRESSION:  Persistent abdominal pain.  Etiology is unclear.  This  could be due to irritable bowel syndrome-like symptoms syndrome from  spasm.  Biliary dyskinesia is a consideration.  There is no evidence for  retained bile duct stones.   RECOMMENDATION:  1. Trial of Levbid  0.375 mg twice a day.  2. Check LFTs, amylase, and lipase.     Barbette Hair. Arlyce Dice, MD,FACG  Electronically Signed    RDK/MedQ  DD: 06/08/2007  DT: 06/08/2007  Job #: [id number]

## 2011-01-15 NOTE — Procedures (Signed)
NAME:  [name], [name]             ACCOUNT NO.:  [id number]   MEDICAL RECORD NO.:  [id number]         PATIENT TYPE:  hrec   LOCATION:                                 FACILITY:   PHYSICIAN:  Erick Colace, M.D.DATE OF BIRTH:  [birth year]   DATE OF PROCEDURE:  DATE OF DISCHARGE:                               OPERATIVE REPORT   PROCEDURE:  Bilateral L5 dorsal ramus injection, bilateral L4 medial  branch block, bilateral L3 medial branch block under fluoroscopic  guidance.    Informed consent was obtained after describing the risks and benefits of  the procedure.  These include bleeding, bruising, infection, loss of  bowel or bladder function, temporary or permanent paralysis.  The  benefits include reduction of pain as she had on prior injection back in  February by greater than 50%.  Her pain does interfere with her self-  care mobility and she does require narcotic analgesics and we would hope  to reduce dosage with successful block.   The patient placed prone on the fluoroscopy table.  Betadine prep,  sterile drape.  A 25-gauge 1-1/2-inch needle was used to anesthetize the  skin and subcu tissue with 1% lidocaine x2 mL at each of 6 sites.  Then,  a 22-gauge 3-1/2-inch spinal needle was inserted under fluoroscopic  guidance targeting the first left S1, SAP, sacroiliac junction.  Bone  contact was made with lateral imaging.  Omnipaque 180 x 0.5 mL  demonstrated no intravascular uptake, then 0.5 mL dexamethasone-  lidocaine solution was injected in the left L5, SAP transverse process  junction targeted.  Bone contact was made and confirmed with lateral  imaging.  Omnipaque 180 x 0.5 mL demonstrated no intravascular uptake,  and 0.5 mL dexamethasone-lidocaine solution was injected in the left L4,  SAP transverse process junction targeted.  Bone contact was made and  confirmed with lateral imaging.  Omnipaque 180 x 0.5 mL demonstrated no  intravascular uptake, then 0.5 mL  dexamethasone-lidocaine solution was  injected.  The same procedure was repeated at corresponding levels on  the right side with same needle, injectate, and technique.  The patient  tolerated the procedure well.  Post- injection vitals were stable.  Post-  injection instructions were given.  Pre-injection pain level 8/10.  Post-  injection 4/10.  We will schedule for medial branch radiofrequency  neurotomy on the right side first in 1 month.      Erick Colace, M.D.  Electronically Signed     AEK/MEDQ  D:  05/05/2008 14:52:30  T:  05/06/2008 04:09:34  Job:  [id number]   cc:   Kerrin Champagne, M.D.  Fax: [phone]

## 2011-01-15 NOTE — Procedures (Signed)
NAME:  [name], [name]             ACCOUNT NO.:  [id number]   MEDICAL RECORD NO.:  [id number]         PATIENT TYPE:  HREC   LOCATION:                                 FACILITY:   PHYSICIAN:  Erick Colace, M.D.DATE OF BIRTH:  [birth year]   DATE OF PROCEDURE:  06/02/2008  DATE OF DISCHARGE:                               OPERATIVE REPORT   PROCEDURE:  Right L5 dorsal ramus radiofrequency neurotomy, right L4  medial branch radiofrequency neurotomy, right L3 medial branch  radiofrequency neurotomy under sacral and lumbar fluoroscopic guidance.   Informed consent was obtained after describing risks and benefits of the  procedure with the patient.  These include bleeding, bruising,  infection, loss of bowel bladder function, temporary, or permanent  paralysis.  She elects to proceed and has given consent.  She has  reduction of pain at least 50% or more following medial branch blocks at  corresponding levels on 2 separate occasions within the last 6 months.   Informed consent obtained after describing risks and benefits of the  procedure with the patient.  Please see written consent form.  The  patient requires narcotic analgesics for pain control and her pain  interferes with self-care and mobility.   The patient was placed prone on the fluoroscopy table.  Betadine prep,  sterile drape.  A 25-gauge 1-1/2-inch needle was used to anesthetize the  skin and subcu tissue, x2 mL.  Then, a 20-gauge 10-cm RF needle with 10-  mm curved active tip was inserted under fluoroscopic guidance first  starting at the right S1, SAP, and sacroiliac junction.  Bone contact  made, confirmed with lateral imaging.  Sensory stem at 50 Hz followed by  motor stem at 2 Hz confirmed proper needle location followed by  injection of 1 mL of solution containing 1 mL of 4 mg/mL dexamethasone  and 3 mL of 2% MPF lidocaine followed by radiofrequency lesioning at 70  degrees Celsius for 70 seconds.  Then, the right  L5, SAP, transverse  process junction targeted.  Bone contact made, confirmed with lateral  imaging.  Sensory stem at 50 Hz followed by motor stem at 2 Hz confirmed  proper needle location followed by injection of 1 mL of dexamethasone-  lidocaine solution and radiofrequency lesioning at 70 degrees Celsius  for 70 seconds.  Then, the right L4, SAP, transverse process junction  targeted.  Bone contact made confirmed with lateral imaging.  Sensory  stem at 50 Hz followed by motor stem at 2 Hz confirmed proper needle  location followed by injection of 1 mL of dexamethasone-lidocaine  solution and radiofrequency lesioning at 70 degrees Celsius for 70  seconds.  The patient tolerated the procedure well.  Pre- and post-  injection vitals were stable.  Post-injection instructions were given.      Erick Colace, M.D.  Electronically Signed     AEK/MEDQ  D:  06/02/2008 11:24:12  T:  06/03/2008 00:09:25  Job:  [id number]

## 2011-01-16 ENCOUNTER — Emergency Department (HOSPITAL_COMMUNITY)
Admission: EM | Admit: 2011-01-16 | Discharge: 2011-01-16 | Disposition: A | Discharge status: Home / Self Care | Payer: Medicare Other | Attending: Emergency Medicine | Admitting: Emergency Medicine

## 2011-01-16 DIAGNOSIS — M79609 Pain in unspecified limb: Secondary | ICD-10-CM | POA: Insufficient documentation

## 2011-01-16 DIAGNOSIS — I10 Essential (primary) hypertension: Secondary | ICD-10-CM | POA: Insufficient documentation

## 2011-01-16 DIAGNOSIS — G40802 Other epilepsy, not intractable, without status epilepticus: Secondary | ICD-10-CM | POA: Insufficient documentation

## 2011-01-16 DIAGNOSIS — F17200 Nicotine dependence, unspecified, uncomplicated: Secondary | ICD-10-CM | POA: Insufficient documentation

## 2011-01-16 DIAGNOSIS — Z79899 Other long term (current) drug therapy: Secondary | ICD-10-CM | POA: Insufficient documentation

## 2011-01-16 DIAGNOSIS — Z7982 Long term (current) use of aspirin: Secondary | ICD-10-CM | POA: Insufficient documentation

## 2011-01-16 DIAGNOSIS — I2510 Atherosclerotic heart disease of native coronary artery without angina pectoris: Secondary | ICD-10-CM | POA: Insufficient documentation

## 2011-01-16 DIAGNOSIS — R209 Unspecified disturbances of skin sensation: Secondary | ICD-10-CM | POA: Insufficient documentation

## 2011-01-16 DIAGNOSIS — IMO0001 Reserved for inherently not codable concepts without codable children: Secondary | ICD-10-CM | POA: Insufficient documentation

## 2011-01-16 LAB — COMPREHENSIVE METABOLIC PANEL
ALT: 24 U/L (ref 0–35)
AST: 26 U/L (ref 0–37)
Albumin: 3.6 g/dL (ref 3.5–5.2)
Calcium: 10 mg/dL (ref 8.4–10.5)
GFR calc Af Amer: >60 mL/min (ref >60)
Sodium: 137 mEq/L (ref 135–145)
Total Protein: 7.1 g/dL (ref 6.0–8.3)

## 2011-01-16 LAB — DIFFERENTIAL
Basophils Absolute: 0.1 10*3/uL (ref 0.0–0.1)
Basophils Relative: 1 % (ref 0–1)
Monocytes Relative: 5 % (ref 3–12)
Neutro Abs: 6.5 10*3/uL (ref 1.7–7.7)
Neutrophils Relative %: 57 % (ref 43–77)

## 2011-01-16 LAB — CBC
Hemoglobin: 13 g/dL (ref 12.0–15.0)
MCH: 28.3 pg (ref 26.0–34.0)
RBC: 4.59 MIL/uL (ref 3.87–5.11)

## 2011-01-16 LAB — PROTIME-INR
INR: 0.94 (ref 0.00–1.49)
Prothrombin Time: 12.8 seconds (ref 11.6–15.2)
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
## 2011-01-18 NOTE — Discharge Summary (Signed)
NAME:  [name], [name] NO.:  [id number]   MEDICAL RECORD NO.:  [id number]          PATIENT TYPE:  INP   LOCATION:  6709                         FACILITY:  [facility]   PHYSICIAN:  Gertha Calkin, M.D.DATE OF BIRTH:  1946/04/15   DATE OF ADMISSION:  01/19/2005  DATE OF DISCHARGE:  01/22/2005                                 DISCHARGE SUMMARY   PRIMARY CARE PHYSICIAN:  Dr. Onalee Hua Day in [place], [place].   CONSULTATIONS:  Dr. Dara Hoyer, GI.   DISCHARGE DIAGNOSES:  1. Abdominal pain.  2. Dilated mild common bile duct.  3. Hypertension.  4. Seizure disorder.  5. Gastroesophageal reflux disease.  6. Bipolar, question.  7. History of asthma.     DISCHARGE MEDICATIONS:  1. Tegretol 200 mg p.o. t.i.d.  2. Risperdal 1 mg q.h.s. and 0.5 mg in the morning and the afternoon at 4.  3. Seroquel 100 mg p.o. q.h.s.  4. Protonix/Prilosec, over-the-counter, one tablet daily.  5. Vicodin 1-2 tabs p.o. q.4-6 hours p.r.n.     HOSPITAL COURSE:  Please see H&P for details of admission.  Please also  refer to [facility] consult notes for details.  The patient admitted with  abdominal pain and GI was consulted.  CT of the abdomen and pelvis showed  intrahepatic ductal dilation and slight dilated common bile duct and distal  common bile duct.  Other lab work, specifically CBC, LFTs, amylase, lipases  were all within normal limits.  GI proceeded with ERCP, results per  operative findings.  Essentially, they noted that there was a mild dilation  of the CBD and CHD; however, when the ducts were swept with 12 and 15 mm  balloons no stones were noticed.  Otherwise, normal ERCP.  The patient was  empirically started on antispasmodic, hyoscyamine.  Patient also was during  this hospitalization empirically started on PPI given history of chronic  over-the-counter NSAID use.  At the time of discharge, patient's abdominal  pain has been relieved with p.o. Vicodin as well as empiric  Protonix and  symptomatic treatment with a GI cocktail.  At this time patient is  tolerating her medications by mouth, is tolerating diet, no nausea, pain is  minimal and GI has recommended that she continue her PPI and have followup  in the office with Dr. Arlyce Dice.  Hyoscyamine that was initially added is an  option and, therefore, will write the script; however, would pursue this  only as additional therapy on top of her PPI and avoiding aspirin products.   Hypertension.  No change.  Her medication was continued.   Gastroesophageal reflux disease, as stated above.  Was started on __________  PPI.   Psych issues and other medical issues were stable throughout this hospital  course and no changes to her medications at the time of discharge.   DISPOSITION:  Stable with improved condition.      JD/MEDQ  D:  01/22/2005  T:  01/22/2005  Job:  [id number]   cc:   Onalee Hua Day, Dr.  [place], [place]   Teena Irani. Arlyce Dice, M.D.  [address]  [place]  [place]  [address]  Fax: [phone]

## 2011-01-18 NOTE — Discharge Summary (Signed)
[name], ROA             ACCOUNT NO.:  [id number]   MEDICAL RECORD NO.:  [id number]          PATIENT TYPE:  INP   LOCATION:  5023                         FACILITY:  [facility]   PHYSICIAN:  Kerrin Champagne, M.D.   DATE OF BIRTH:  [birth year]   DATE OF ADMISSION:  06/10/2005  DATE OF DISCHARGE:  06/12/2005                                 DISCHARGE SUMMARY   ADMISSION DIAGNOSES:  1.  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  Seizure disorder.  3.  Migraine headaches.  4.  Gastroesophageal reflux disease.  5.  Diverticulitis.  6.  Urinary incontinence.  7.  Multiple abdominal surgeries including hysterectomy, appendectomy,      cholecystectomy, rectocele, and vagicele repair, and bladder repair.  8.  Latex allergies.   DISCHARGE DIAGNOSIS:  1.  Severe degenerative disk changes C5-6 and C6-7 with degenerative disk      protrusion into the right-sided neural foramen of C6 and C7 nerve root      spondylosis changes involving the uncovertebral joints of the posterior      aspect of the disk space affecting the right side neural foramen of C6      and C7.  2.  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  Seizure disorder.  4.  Migraine headaches.  5.  Gastroesophageal reflux disease.  6.  Diverticulitis.  7.  Urinary incontinence.  8.  Multiple abdominal surgeries including hysterectomy, appendectomy,      cholecystectomy, rectocele, and vagicele repair, and bladder repair.  9.  Latex allergies.  10. Tobacco withdrawal.   PROCEDURE:  On June 10, 2005 the patient underwent anterior diskectomy and  fusion C5-6 and C6-7 with right iliac crest bone graft harvested through a  separate fascial incision.  This was performed by Dr. Otelia Sergeant, assisted by  Maud Deed, P.A.-C. under general anesthesia.   CONSULTATIONS:  None.   BRIEF HISTORY:  Patient is 65 year old right hand  dominant female  experiencing neck pain with radiation into her right shoulder and upper  extremities since February of 2006.  She has demonstrated weakness in the  right arm with her persistent pain.  MRI studies, EMGs, and nerve conduction  studies all indicate cervical radiculopathy involving both C6 and C7 nerves  secondary to disk protrusion central and right at C5-6 and C6-7.  She is  also known to have intrinsic right shoulder discomfort associated with a  type 3 acromion process and degenerative tendinosus changes involving the  rotator cuff.  It was felt that she would require surgical intervention to  help alleviate her discomfort and radiculopathy.  She was admitted for the  procedure as stated above.   BRIEF HOSPITAL COURSE:  Postoperatively her right upper extremity pain  improved significantly.  She did have pain in the posterior and anterior  neck and was given PCA medications and gradually weaned to oral medications  without difficulty.  Patient had no nausea or vomiting.  She did have a mild  sore  throat, but was able to take liquids.  Drain was removed from the  anterior neck on the first postoperative day and dressing changes were done  daily thereafter during the hospital stay without signs of infection or  drainage.  She utilized ice to the right iliac crest wound which helped with  her discomfort.  She was weaned from her 56 and incentive spirometry was  utilized.  Foley catheter was discontinued and she was able to void without  difficulty. She did require nicotine patch for tobacco withdrawal. The  patient was afebrile with vital signs stable throughout the hospital stay.  Occupational therapy assisted her with ADLs and physical therapy assisted  her with ambulation.  She was provided a rolling walker and a hospital bed  for home use which were arranged prior to her discharge to home.  On the  second postoperative day she was much more comfortable.  Patient was  felt  stable for transfer to home as she was afebrile with vital signs stable and  her pain was well controlled.  The patient utilized an Biochemist, clinical  throughout the hospital stay and was provided with a [place] collar for  use at home for showering purposes.  Home medications were continued through  the hospital stay as taken prior to admission.   PERTINENT LABORATORY VALUES:  Admission laboratories include CBC showing WBC  11.6, hemoglobin 13.8, hematocrit 40.2.  Coagulation studies as well as  chemistry studies were normal on admission.  Urinalysis on admission was  negative for urinary tract infection.  Repeat urinalysis with removal of  Foley catheter showed moderate hemoglobin, but no WBCs or RBCs seen per high  powered field.  Chest x-ray on admission:  Mild bronchial thickening, no  active disease.   PLAN:  Arrangements were made for her discharge to home.  [facility]  was utilized to provide home health physical therapy and occupational  therapy.  She was advised to continue on a low salt diet.  Her home  medications were listed for her and she was advised to continue taking them  as prior to admission.  She was advised to follow up with her primary care  physician as soon as possible for continuation of her home medications.  However, she did require a prescription for Seroquel, her usual medication,  as she would be out of the medication before returning to her usual primary  care physician.  Other prescriptions given at discharge include Percocet  5/325 one to two every four to six hours as needed for pain, Robaxin 500 mg  one every eight hours as needed for spasm.  She will be on a stool softener  daily while taking pain medications.  She was advised to use a laxative as  needed.  Patient was also advised to avoid aspirin or Advil and ibuprofen  products until further notice.  Patient was allowed to shower four to five days from surgery if no drainage from her  wound.  She was asked to use the  [place] collar for showering purposes and to change her dressing daily.  The patient was advised to avoid driving or lifting.  She will ambulate as  tolerated.  She will wear her [place] collar at all times.  Hospital bed was  made available for her for use at home as well as a rolling walker.  Patient  will follow up with Dr. Otelia Sergeant two weeks from the date of surgery and was  advised to call to arrange  the appointment.   CONDITION ON DISCHARGE:  Stable.     Wende Neighbors, P.A.      Kerrin Champagne, M.D.  Electronically Signed   SMV/MEDQ  D:  08/01/2005  T:  08/01/2005  Job:  [id number]

## 2011-01-18 NOTE — Assessment & Plan Note (Signed)
[facility]                            CARDIOLOGY OFFICE NOTE   [name], [name]                    MRN:          [id number]  DATE:12/24/2006                            DOB:          [birth year]    [name] returns for follow up as an outpatient at the [facility]  cardiology office on December 24, 2006. [name] was initially  evaluated on March 5 for chest pain. She has coronary risk factors of a  family history of premature coronary artery disease, as well as  essential hypertension and tobacco abuse. Her pain was atypical, but I  thought that with her risk factors it was worthwhile to perform a stress  test. Her adenosine Myoview study was performed on March 13 and  demonstrated normal left ventricular contractility with a left  ventricular ejection fraction of 68%. There was no sign of scar or  ischemia.   [name] continues to have intermittent chest pains. There are non-  exertional. Overall, her pains are improved since her initial  evaluation. She has problems with pain in the back and the neck and  those are primary complaints.   CURRENT MEDICATIONS:  Unchanged and include:  1. Risperdal.  2. Tegretol.  3. Benicar/HCT 20/12.5 mg daily.  4. Mirapex.  5. Vesicare.  6. Clonazepam.  7. Omeprazole.  8. Metoprolol ER 50 mg daily.  9. Aspirin 81 mg daily.  10.Amlodipine 5 mg daily.  11.Metformin 500 mg twice daily.   PHYSICAL EXAMINATION:  GENERAL:  She is alert and oriented and in no  acute distress.  VITAL SIGNS:  Weight 193 pounds, blood pressure 140/70, heart rate 101.   EKG shows sinus tachycardia with a heart rate of 101, otherwise within  normal limits.   ASSESSMENT:  [name] is a 65 year old woman with atypical chest  pain. Her adenosine Myoview study is reassuring. I think that she can be  continued with medical therapy as it is unlikely that her chest pain is  related to obstructive coronary artery  disease. Her cardiac risk factors  appear to be under reasonable control and she will continue to follow  with Dr. Artist Pais for her hypertension and dyslipidemia. I wrote her a  prescription for a home blood pressure cuff as she would like to keep a  closer eye on her blood pressure. I will plan on seeing [name]  on an as needed basis. I would be happy to see her at any time if she  has any cardiac related problems.     Veverly Fells. Excell Seltzer, MD  Electronically Signed    MDC/MedQ  DD: 12/24/2006  DT: 12/25/2006  Job #: [id number]   cc:   Barbette Hair. Artist Pais, DO

## 2011-01-18 NOTE — H&P (Signed)
NAME:  [name], [name]                       ACCOUNT NO.:  [id number]   MEDICAL RECORD NO.:  [id number]                   PATIENT TYPE:  IPS   LOCATION:  0304                                 FACILITY:  [facility]   PHYSICIAN:  Geoffery Lyons, M.D.                   DATE OF BIRTH:  [birth year]   DATE OF ADMISSION:  06/01/2003  DATE OF DISCHARGE:                         PSYCHIATRIC ADMISSION ASSESSMENT   IDENTIFYING DATA:  This is an involuntary admission.  Identifying  information comes from the patient and accompanying records.  This is a 65-  year-old white married female.  Her husband, apparently, took out commitment  papers.  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  She was released from [facility]  most recently about four months ago.  She is currently experiencing  delusions and is out of touch with reality, saying things like I'm a  virgin when in fact she has three children.  She has been manic and forced  everyone from the house saying that she was going to kill herself.  As she  was felt to be a danger to herself and others in this state of mind,  commitment papers were issued.  The patient states that, when people come  into your lives, sometimes they just disrupt things and she cannot allow  this disruption in her marriage, etc.  She starts out coherent and then  drifts off.  She becomes tangential, circumstantial, somewhat suspicious,  avoid answers, etc.   PAST PSYCHIATRIC HISTORY:  Apparently, the record indicates several prior  psychiatric admissions, the most recent one being to [facility]  several months ago.   SOCIAL HISTORY:  She finished the 10th grade.  She states that she has been  married six times and she only has one daughter, age 63.  We cannot verify.   FAMILY HISTORY:  She denies that anyone else has any mental illness.   ALCOHOL/DRUG HISTORY:  The records reflects that she has  had no issues with  alcohol for 15 years.   PRIMARY CARE PHYSICIAN:  Dr. Duncan Dull up in [place].  He treats her for  hypertension, back pain, anxiety and hypercholesterolemia as well as  temporal lobe seizures.   MEDICATIONS:  She currently takes Vicodin 750 mg, 1 every four hours or 1-2  every six hours, Xanax 1 mg, 1 pill t.i.d., ASA 325 mg, 1 q.d., Tricor (?) 1  q.d., Vasotec 10 mg, 1 q.d., Lipitor (?) 1 q.d. and Tegretol 200 mg, 1  t.i.d.   ALLERGIES:  PENICILLIN.   POSITIVE PHYSICAL FINDINGS:  GENERAL:  This is a white female who appears  older than her stated age of 65.  HEENT:  She is edentulous and compensated.  The remainder of her HEENT exam  was within normal limits.  CHEST:  Her breaths sounds were clear.  ABDOMEN:  Her abdomen was benign.  She states she recently had mammograms  and needs to have follow-up regarding some findings of the mammogram.  MUSCULOSKELETAL:  Musculoskeletal system revealed no clubbing, cyanosis or  edema.  Neurologically, cranial nerves 2-12 are grossly intact.   MENTAL STATUS EXAM:  She is alert and oriented x 3.  She was neat.  She was  dressed appropriately.  She attempts to cooperate.  Her speech initially had  a normal rate, rhythm and tone.  However, she becomes irritated and  agitated.  Her thought process becomes scattered and disorganized.  Her  concentration and memory can be intact.  Her judgment and insight are  impaired.  Her intelligence is average.   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   AXIS II:  Rule out personality disorder.   AXIS III:  1. History for temporal lobe seizures.  2. Hypertension.  3. Hypercholesterolemia.  4. Back pain.   AXIS IV:  Severe.   AXIS V:  She is currently 65.   PLAN:  Admit to stabilize and provide safety, to rule out underlying medical  illnesses such as UTI or hypothyroidism, adjust medications as indicated and  have the casemanager assess the  home situation to make sure that returning  home is appropriate.     Mickie Leonarda Salon, P.A.-C.               Geoffery Lyons, M.D.    MD/MEDQ  D:  06/01/2003  T:  06/01/2003  Job:  [id number]

## 2011-01-18 NOTE — H&P (Signed)
NAME:  Teresa, Mathews NO.:  [id number]   MEDICAL RECORD NO.:  [id number]          PATIENT TYPE:  INP   LOCATION:  1826                         FACILITY:  [facility]   PHYSICIAN:  Michaelyn Barter, M.D. DATE OF BIRTH:  [birth year]   DATE OF ADMISSION:  01/18/2005  DATE OF DISCHARGE:                                HISTORY & PHYSICAL   Primary care physician:  Dr. Onalee Hua Day in [place], [place].  Therefore, the patient is unassigned.  This is an [facility]  hospitalist admitting history and physical.   CHIEF COMPLAINT:  Pain in the right and left side of the chest/abdomen.   HISTORY OF PRESENT ILLNESS:  [name] is a 65 year old female with a  past medical history of asthma, hypertension, seizure disorder, who states  that she has been having pain for approximately one week.  She states that  the pain feels like a severe cramp.  It comes and it goes and is  occasionally long-lasting, lasting up to an hour at a time.  She is somewhat  vague with regard to the exact location of her pain but states that it is in  her right lower chest/upper right abdomen and left lower chest/left upper  abdomen that is involved.  She states that she had similar right-sided pain  approximately  3 to 4 years ago but it did not last as long as this current episode.  She  has had some nausea, some diarrhea but no emesis, no fevers or chills.  She  has had some shortness of breath occasionally when the pain travels to her  chest.  In addition, she states that she has occasionally experienced some  burning and stinging sensation through her chest.  In addition, the  chest/abdominal pain occasionally radiates down her groin bilaterally.   PAST MEDICAL HISTORY:  1.  Psychotic disorder.  2.  Asthma.  3.  Hypertension.  4.  Seizure disorder.  Last seizure was three week ago, described as petite      mal in nature.   PAST SURGICAL HISTORY:  1.  Total abdominal hysterectomy  secondary to excessive bleeding.  2.  Rectocele.  3.  Vaginocele.  4.  Cholecystectomy in 1995.  5.  Appendectomy.  6.  Mesh under the bladder.  7.  Eye surgery to correct strabismus.  8.  Repair of right foot injury.   ALLERGIES:  PENICILLIN produces a large bump on the patient's arm which  requires lancing.   HOME MEDICATIONS:  1.  Tegretol 200 mg p.o. t.i.d.  2.  Risperdal 25 mg, 1/2 tablet b.i.d. and 25 mg q.h.s.  3.  Seroquel 100 mg daily.  4.  Albuterol MDI p.r.n.   FAMILY HISTORY:  The patient's mother is deceased secondary to lung and  brain cancer.  Father was murdered.   SOCIAL HISTORY:  The patient is separated.  Cigarettes:  She smokes one pack  per day of cigarettes.  She started smoking at the age of 28.  Alcohol:  She  denies.  Street drugs:  She denies.   REVIEW OF SYSTEMS:  As per HPI.  Otherwise, all other systems are negative.   PHYSICAL EXAMINATION:  GENERAL:  The patient is awake.  She is cooperative.  She does not appear to be in any obvious distress.  VITALS:  Temperature 98, blood pressure 130/87, heart rate 105, respirations  22.  O2 SAT 95% on room air.  HEENT:  Anicteric.  Pupils equally reactive to light.  Extraocular movements  intact.  There are no teeth present.  NECK:  Supple.  No lymphadenopathy.  The thyroid is not palpable.  CARDIAC:  S1, S2 are present.  Regular rate and rhythm.  Heart sounds are  distant.  Therefore, murmurs are difficult to appreciate if they are, in  fact, present.  RESPIRATORY:  There are occasional scattered wheezes in both lung fields  anteriorly and posteriorly.  Otherwise, breath sounds are slightly  diminished bilaterally and there are no crackles or wheezes__________.  ABDOMEN:  There are multiple old well-healed right upper quadrant scars  secondary to what appeared to have been a percutaneous cholecystectomy  previously.  Bowel sounds are decreased in all 4 quadrants.  Organomegaly is  very difficult to  appreciate.  Therefore, no hepatosplenomegaly palpable.  Pain is present across the upper abdominal region, especially within the  right quadrant and the right quadrant pain is intensified when the patient  takes deep inspirations.  EXTREMITIES:  No leg swelling.  NEUROLOGIC:  The patient is alert and oriented x 3.   CT scan of the abdomen, which was completed on Jan 18, 2005, reveals mild  intrahepatic ductal dilation.  Bile duct dilated, measuring 10.5 mm with  abrupt narrowing distally.  Recommend further evaluation with ERCP or MRCP  to rule out an obstructing stone or tumor.  Exam is otherwise negative.   An ABG was completed, the pH of which was 7.394, PCO2 of 37, bicarb 22.6.  Hemoglobin 13.8, hematocrit 41.6, white blood count is 10, platelets are  329.  Sodium 136, potassium 4, chloride 105, CO2 of 22, BUN 9, creatinine  0.6, glucose 80.  Bilirubin total is 0.4.  Alk phos is 97.  SGOT 17, SGPT  14.  Total protein is 7.1.  Albumin 3.6, calcium 8.9, amylase 91, lipase 26.   ASSESSMENT/PLAN:  1.  Abdominal pain.  The etiology is questionable.  This could be due to the      intrahepatic ductal dilation that is seen on the CAT scan.  Will consult      Gastrointestinal for further evaluation, in that the patient may need      either endoscopic retrograde cholangiopancreatography or magnetic      resonance cholangiopancreatography.  Will provide proton pump inhibitors      via Protonix 40 mg daily for now.  Will consider antibiotics.  However,      the patient is currently afebrile.  Her liver function tests are normal.      Therefore, I question whether or not the patient actually needs any      antibiotics empirically at this particular time.  Will monitor for now      and, again, the patient denies any fevers or chills.  2.  Chest pain.  This is atypical in origin.  It may be due to referred pain     from the abdomen.  Will, however, check cardiac enzymes x 3, every 8      hours  apart, to rule out a cardiac event.  3.  History of asthma.  This is currently stable.  Will provide albuterol  to      use on a p.r.n. basis.  4.  History of hypertension.  The patient's blood pressure is currently      stable.  Will monitor for now.  May consider      antihypertensive medication later if needed.  5.  History of seizure disorder.  No new seizures.  Will resume home      medications for now.  6.  History of psychotic disorders.  Will continue prior medications.      OR/MEDQ  D:  01/19/2005  T:  01/19/2005  Job:  [id number]   cc:   Onalee Hua Day, Dr.  [facility]

## 2011-01-18 NOTE — Op Note (Signed)
NAMECALLISTA, HOH             ACCOUNT NO.:  [id number]   MEDICAL RECORD NO.:  [id number]          PATIENT TYPE:  INP   LOCATION:  2899                         FACILITY:  [facility]   PHYSICIAN:  Kerrin Champagne, M.D.   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   POSTOPERATIVE DIAGNOSIS:  Severe, degenerative disc changes both C5-C6 and  C6-C7 with degenerative disc protrusion into the right sided neural foramen,  C6 and C7 nerve root spondylosis changes involving the uncovertebral joints  of the posterior aspect of the disc space affecting the right side neural  foramen of C6 and C7.   PROCEDURE:  Anterior discectomy and fusion, C5-C6 and C6-C7, with right  iliac crest bone graft harvested through a separate incision, the microscope  was used during the procedure, internal fixation of the C5-C6 and C6-C7  levels using a 35 mm Alphatec locking plate and 14 mm screws.   SURGEON:  Kerrin Champagne, M.D.   ASSISTANT:  Wende Neighbors, P.A.-C.   ANESTHESIA:  GOT, Dr. Judie Petit.   ESTIMATED BLOOD LOSS:  50 mL.   DRAINS:  TLS drain anterior neck, 10 French, and Foley catheter to straight  drain.   HISTORY OF PRESENT ILLNESS:  This patient is a 65 year old right hand  dominant female who has been experiencing neck pain with radiation into her  shoulder and right arm since February of this year.  She has weakness in the  arm, problems with persistent, severe pain.  She has had an extensive  evaluation including MRI studies, EMGs, nerve conduction studies.  EMGs have  returned indicating cervical radiculopathy involving both C6 and C7.  She  also has MRI scan demonstrating disc protrusion central and right sided at  C5-C6 and C6-C7.  She was felt, also, to have intrinsic  right shoulder  discomfort associated with a type 3 acromion process and degenerative  tendinosus changes involving the rotator cuff.   OPERATIVE FINDINGS:  The patient was found to have several small disc  herniation fragments at both the C5-C6 and C6-C7 levels entering into the  right neural foramen and lateral recess at both segments.  Spondylosis  involving the posterior aspect of the disc space and posterolateral region  at C5-C6 greater than C6-C7.   DESCRIPTION OF PROCEDURE:  After adequate general anesthesia, a Foley  catheter placed, patient in supine position.  She has a latex allergy so no  latex was used during the procedure and latex precautions were carried out  throughout this procedure.  She also has a penicillin allergy so that Ancef  was not given.  She was given preoperative vancomycin.  Following  application of a Foley catheter, she underwent standard prep of the right  iliac crest and anterior left neck following placement of 5 pounds cervical  halter retraction, skids were used for both shoulders, both arms and lower  extremities were well padded to  protect from pressure.  TED hose were  applied.  After prepping, she was draped in the usual manner.  Iodine Vi-  Drapes were used over the right iliac crest and the anterior neck.  The  incision over the anterior neck at the expected C6 level in line with the  patient's skin crease at the cricothyroid cartilage level.  The incision was  approximately 3.5 inches in length through the skin and subcutaneous layers  in line with the patient's normal skin creases.  This was carried down  through the superficial fascial layers, fatty layer, through the platysmal  layer, using electrocautery.  Metzenbaum scissors were used to carefully  spread the subcu layers developing the interval between the trachea and  esophagus medially and the carotid sheath laterally to the anterior aspect  of the cervical spine.  The anterior  prevertebral fascia was identified.  This was retracted medially, cauterized along the medial border of the  longus colli muscle using bipolar electrocautery then teased across the  midline with Barista.  The anterior disc space was then identified,  the carotid tubercle identified, the spinal needle was then placed at the  expected C5-C6 and C6-C7 levels with the sheaths intact allowing only a cm  to protrude and be allowed to be placed into the disc space.  Interoperative  lateral radiograph was obtained using traction on the upper extremities.  This lateral radiograph of the cervical spine demonstrated the needles at  both the C5-C6 and C6-C7 levels.   While the radiograph was being developed, an incision was made over the  right anterior lateral iliac crest approximately 3 inches in length through  the skin and subcutaneous layers down to the right iliac crest  anterolaterally about 3-4 inches posterior to the anterior superior iliac  spine.  The incision was made directly onto the iliac crest and  subperiosteal dissection then carried superomedial as well as lateral  exposing the crest.  This was then packed for later use for bone graft  harvest.  This area was infiltrated with Marcaine 0.5% with 1:100,000  epinephrine 5 mL.   Attention was returned to the anterior cervical region where the spinal  needles were carefully withdrawn and each of the levels were marked with a  15 blade scalpel incising the disc space and removing the course of the  anterior aspect of the disc at C5-C6 and C6-C7.  Carefully the medial border  of the longus colli muscle both on the left and right side was freed up off  the anterior aspect of the cervical spine using a Key elevator.  Electrocautery was used to control bleeders as well as bipolar  electrocautery.  A McCullough retractor was then placed beneath the medial  border of the longus colli muscles on both sides of the C6-C7 level  and distraction obtained demonstrating the disc space quite nicely.  A 14 mm  screw post was then placed in the vertebral body of C7, additional parallel  one placed at the C6 level and distraction obtained across the disc space at  C6-C7.  A 15 blade scalpel was then used to further incise the disc  anteriorly.  Pituitary rongeurs were used to debride the disc space of  degenerative disc material.  A 2-0 microcuret was used to debride the  endplates of cartilaginous material as well as debride the disc of material  that was remaining within the disc space.  Loupe magnification and headlamp  was used during this portion of  the procedure.  The disc space was debrided  back to the posterior aspect of the posterior annulus.  The operating room  microscope was then brought into the field and under direct visualization,  the posterior annulus was resected and the posterior longitudinal ligament  resected in the midline on the right side using 1 mm Kerrisons and 3-0  microcuret.  The right neural foramen for the C7 nerve root was carefully  opened with foraminotomy of the uncovertebral joint.  Resection of the  posterior longitudinal ligament disc material was noted to be extruding  through a rent in the central portions of the posterior longitudinal  ligament over the right side of the anterior aspect of the thecal sac.  This  was removed using micropituitaries.  The posterior lip osteophytes were  resected along the central and right side over the posterior inferior aspect  of C6 and the posterior superior aspect of C7 down to the uncovertebral  joint.  This was resected, as well, until a nerve hook could be passed out  the right neural foramen of C7 without difficulty.  Irrigation was  performed.  Both the endplates inferior aspect of C6 and superior aspect of  C7 were debrided back to bleeding, bony surfaces of endplate and bone.  A  high speed bur was used to carefully remove and smooth  these areas.  An 8 mm  sounder was used to sound the disc space and it sounded quite nicely and  this provided good fit.  The depth of the intervertebral disc space was  measured using a Cloward depth gauge at 18 mm.  Returning to the right iliac  crest bone graft harvest site, using an 8 mm dual oscillating saw,  protecting the soft tissue structures medial and lateral, then the dual  oscillating saw was used to perform incision into the iliac crest  transversely.  The base of the 8 mm graft was then divided using a 1/4 inch  curved osteotome.  This graft was carefully tapered to the dimensions of the  intervertebral disc space, a depth of approximately 14 mm, a height of 8 mm.  It was keyed in order to allow for keying the graft.  Irrigation was  performed to the intervertebral disc space.  Bleeding bony surfaces were  hemostased using thrombin soaked Gelfoam.  The graft was then placed into  the intervertebral disc space after careful inspection demonstrated no further soft tissue remained that could be retropulsed with the insertion of  the graft.  The graft was then impacted into place and subset 1-2 mm beneath  the anterior lip of the disc space at C6-C7.  The 14 mm screw post was then  removed at the C6-C7 level.  The retractor was then removed and then  replaced at the C5-C6 level with the foot of the blade beneath the border of  the longus colli muscle providing exposure at the C5-C6 level.  The  distraction screw was then placed at the C5 level and distraction obtained  across the C5-C6 disc space.  The disc was then incised anteriorly using a  15 blade scalpel and debrided of degenerative disc material as well as  cartilaginous endplates using pituitaries as well as 2-0 microcurets back to  the posterior annulus.  The posterior annulus was then resected again using  a curettage as well as pituitary rongeurs.  The endplates were then debrided  back to bleeding bony endplates using  a high speed bur and this was  then  removed back to the posterior lip osteophytes of this segment.  The  posterior longitudinal ligament was then resected in the midline out to the  right side at the C5-C6 level.  The uncovertebral joints were resected on  the right side decompressing the right C6 nerve root until a nerve hook  could be easily passed out the neural foramen on the right side at C5-C6.  A  great deal of disc material was found to be present exiting out the right  neural foramen as well as within the lateral recess associated with the  spondylosis and the posterior lip osteophytes over the posterior right side  inferior aspect of C5 and superior aspect of C6.  These were resected as  best as possible.  I felt that the right cord and right C6 nerve root were  well decompressed at this point.  The height of the intervertebral disc  space was measured at 17 mm using a sounder in the depth measuring 18 mm  with a Cloward depth gauge.  Attention was returned to the right iliac crest  where, again, an 8 mm dual oscillating saw was used to harvest further bone  graft, the division made, protecting the soft tissue structures medial and  lateral.  The operating room microscope was used for the second level in its  entirety and was used for the first level after resection of the disc back  to the posterior annulus.  The second graft was obtained carefully  protecting soft tissue structures medial and lateral, and was obtained using  the oscillating saw set at 8 mm, dividing the base with a 1/4 inch  osteotome.  This was then carefully tapered to the dimensions of the  intervertebral disc space, both this graft and the graft at the C6-C7 level  were made as J-grafts as the disc space could not accept the full width of  the iliac crest bone graft.  The height of the intervertebral disc space at  7 mm was carefully trimmed from the 8 mm cut made using the oscillating saw  and then  carefully keyed using the high speed bur.  A height of 7 mm and a depth of 14 mm was chosen.  This graft was carefully again tapered to the  dimensions of the intervertebral disc space, the disc space irrigated and  hemostased using thrombin soaked Gelfoam over the bleeding bony surfaces had  been placed over the intervertebral disc space after inspection demonstrated  no soft tissue that could be retropulsed with insertion of the graft, the  graft was then impacted into place without difficulty.  The screw posts were  then removed at the C5-C6 level.  Bone wax was applied to bleeding screw  post holes.  5 pounds cervical halter traction was released.  Carefully, the  anterior spurs at the C5-C6 level and C6-C7 level were resected using a high  speed bur and smoothed appropriately.  Using a cottonoid string coated with  bone wax, the expected length between the superior and inferior screws of  the plate, were then measured at 35 mm.  A 34 mm plate was chosen.  The  plate was then placed against the anterior aspect of the cervical spine.  It  fit quite nicely over the anterior three levels, C5, C6, and C7.  This  carefully aligned in the midline and then temporary fixation pins were  placed on the left side at C7 and on the right side at the  C6 level  stabilizing the plate so that the initial screws could be placed at the C5  level.  Carefully protecting the soft tissue structures with handheld  Clowards, then 14 mm drills were used to drill holes at the C5 level and 14  mm screws were placed on the right side regular screw, on the left side a  revision screw as this particular plate hole approximated openings from the  previous screw posts base.  The stabilization pins were then removed after  the placement of screws at the C5 level.  The stabilization pin on the right  side at C6 and on the left side at C7.  Attention was turned then to  placement of the screws at the C6 level, the screw  placed on the left side  was a residual 14 mm screw and on the right side a regular 14 mm screw  drilling with a 14 mm drill bit.  Then, at the C7 level, both a right side  regular screw and a left side revision screw, 14 mm in length.  This  completed the internal fixation, the screws were each carefully tested and  felt to obtain excellent purchase on each side from C5, C6, to C7.  The  locking mechanism was then twisted using the locking screwdriver deforming  the planes centrally in order to lock the screws at the C7, C6, and C5  levels and this was done without difficulty.   Irrigation was then performed of the incision over the anterior neck.  Careful inspection demonstrated no active bleeding present.  Interoperative  radiograph obtained after placement of a 10 French TLS drain and sewing in  place with a 4-0 nylon stitch, interoperative lateral radiograph  demonstrated placement of the screws in good position with alignment of both  the bone graft as well as the screws appearing to be of good length without evidence of impingement on the spinal canal at any level.  The drain over  the anterior aspect of the cervical spine sewn in place.  The platysmal  layer was approximated with interrupted 3-0 Vicryl sutures, the deep subcu  layers reapproximated with interrupted 3-0 Vicryl suture, and the skin was  closed with a running subcu stitch of 4-0 Vicryl.  Tincture of Benzoin and  Steri-Strips were applied to the neck and the TLS drain was charged.  The  right iliac crest bone graft harvest site carefully irrigated, bone wax  applied to the bleeding cancellous bone in order to obtain hemostasis.  Gelfoam was applied.  The abdominal fascia of the abdomen then approximated  as was the proximal fascia with interrupted #1 Vicryl sutures, the deep  subcu layers approximated with interrupted #1 and 0 Vicryl sutures, the more  superficial layers with interrupted 2-0 Vicryl sutures, and the  skin was  closed with a running stitch of 4-0 Vicryl.  Tincture of Benzoin and Steri-  Strips were applied.  The patient then had 4 by 4s affixed to the skin with  Hypafix tape over the right iliac crest and anterior neck.  A [place]  collar was applied.  The patient was then reactivated, extubated, and  returned to the recovery room in satisfactory condition.  All instrument and  sponge counts were correct.      Kerrin Champagne, M.D.  Electronically Signed     JEN/MEDQ  D:  06/10/2005  T:  06/10/2005  Job:  [id number]

## 2011-01-18 NOTE — Letter (Signed)
November 05, 2006    Barbette Hair. Artist Pais, DO  [address] [place], [place] [address]   RE:  [name], [name]  MRN:  [id number]  /  DOB:  1945-10-05   Dear Dr. Artist Pais:   It was my pleasure to see [name] as an outpatient at the  [facility] on November 05, 2006.   As you know, she is a 65 year old woman who presents here for evaluation  of chest pain.   She reports a longstanding history of chest pain that has been  accelerated  over the past few weeks.  She describes pain that feels  like a weight on her chest, and a cramping type of pain in the  chest.  It is worse with certain movements as well as with coughing.  At  times it occurs with exertion.  It is not consistently related to  exertion.  She also has dyspnea on exertion that has been chronic, but  worse over the last 5 months.  She has intermittent lightheadedness  without a history of syncope.  She does not exercise or exert herself  much.  She denies orthopnea, PND or edema.  She has not had any previous  history of heart problems other than being told she has a heart murmur  as a child.   PAST MEDICAL HISTORY:  Pertinent for the following:  1. Seizure disorder.  2. Essential hypertension.  3. Surgery for repair of a rectocele and bladder mesh insertion.  4. Hysterectomy in 1978.  5. Remote appendectomy.  6. Foot surgery in the 1980s.  7. Neck disk surgery in 2006.   FAMILY HISTORY:  The patient's brother had a myocardial infarction at  age 73.  He is living.  She has a sister who died at age 72 of a  myocardial infarction.  There is no other coronary artery disease in the  family.  Her mother died at age 43 of lung cancer with metastasis to the  brain.  Her father was murdered at age 57.   SOCIAL HISTORY:  The patient is disabled.  She used to work in [employer], hospital.  She smokes 2 packs of cigarettes for 35 to 40 years.  She does not use illicit drugs, but has a history of remote marijuana  use.   She does not drink alcohol.   MEDICATIONS:  Include:  1. Seroquel 100 mg at bedtime.  2. Risperdal 1 mg 1/2 b.i.d. and at bedtime.  3. Tegretol 200 mg 3 times daily.  4. Estrace vaginal cream 2 times weekly.  5. Benicar/hydrochlorothiazide 20/12.5 mg daily.  6. Zyrtec 10 mg daily.  7. Mirapex 0.25 mg at bedtime.  8. Prilosec 20 mg daily.  9. Clonazepam 0.5 mg twice daily.  10.Omeprazole 20 mg twice daily.  11.Metoprolol ER 50 mg daily.  12.Amlodipine 5 mg daily.   ALLERGIES:  PENICILLIN.   REVIEW OF SYSTEMS:  A complete 12-point review of systems was performed.  Pertinent positives included palpitations, headaches, dizziness, leg  pain, asthma, breathing problems, constipation, fatigue, arthritis,  anxiety, depression, skin cancer.  All other systems were reviewed, and  is negative except as detailed above.   PHYSICAL EXAMINATION:  The patient is alert and oriented.  She is in no  acute distress.  Weight is 192 pounds.  Blood pressure is 134/76.  Heart rate is 95.  Respiratory rate is 16.  HEENT:  Normal.  NECK:  Normal carotid upstrokes without bruits.  Jugular venous  pressure  is normal.  No thyromegaly or thyroid nodules.  LUNGS:  Clear to auscultation bilaterally.  HEART:  The apex is discrete and non-displaced.  There is no right  ventricular heave or lift.  The heart is regular rate and rhythm without  murmurs or gallops.  ABDOMEN:  Soft with mild diffuse tenderness.  There is no rebound or  guarding.  Bowel sounds are present.  There are no abdominal bruits.  EXTREMITIES:  No clubbing, cyanosis or edema.  Peripheral pulses are 2+  and equal throughout.  SKIN:  Warm and dry without rash.  BACK:  There is no flank tenderness.  NEUROLOGIC:  Cranial nerves 2 through 12 are intact.  Strength is 5/5  and equal in the arms and legs.  LYMPHATICS:  There is no adenopathy.   EKG shows normal sinus rhythm, and is within normal limits.   ASSESSMENT:  [name] is a  65 year old woman with longstanding chest  pain.  Her cardiac risk factors include essential hypertension, tobacco  abuse, and family history of premature coronary artery disease.  I think  she needs further evaluation, and I have recommended an adenosine  Myoview stress study.  Her symptoms are predominantly atypical, but in  the setting of her multiple risk factors, I think an evaluation for  ischemic heart disease is warranted.   Additional cardiac issues are as follows:  1. Hypertension.  She currently has good control on multi-drug therapy      for her blood pressure.  2. Dyslipidemia.  She has a total cholesterol of 204 with HDL of 42,      and LDL of 133.  Her lipids are borderline, and for someone with      moderate cardiac risk, it may be reasonable to give her a trial      lifestyle modification, but I will certainly leave this to your      discretion.   Dr. Artist Pais, thanks again for allowing me the opportunity to see Ms.  Mathews.  I will be in contact with you after the results of her stress  test are available.    Sincerely,      Veverly Fells. Excell Seltzer, MD  Electronically Signed    MDC/MedQ  DD: 11/05/2006  DT: 11/05/2006  Job #: [id number]
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
## 2011-01-29 ENCOUNTER — Telehealth: Payer: Self-pay | Admitting: Gastroenterology

## 2011-01-29 MED ORDER — PROCHLORPERAZINE 25 MG RE SUPP: 25.0000 mg | Freq: Two times a day (BID) | RECTAL | Status: DC | PRN

## 2011-01-29 MED ORDER — PANTOPRAZOLE SODIUM 40 MG PO TBEC: 40.0000 mg | DELAYED_RELEASE_TABLET | Freq: Two times a day (BID) | ORAL | Status: DC

## 2011-01-29 NOTE — Telephone Encounter (Signed)
protonix 40mg  1-2x day

## 2011-01-29 NOTE — Telephone Encounter (Signed)
Pt states that she needs something else other than her Dexilant, she cannot afford it. She states that when she takes omeprazole she has a choking feeling in her chest. She also is requesting a refill of her Promethazine supp. To [facility], they will deliver. Which PPI would you suggest for the pt? Please advise.

## 2011-01-29 NOTE — Telephone Encounter (Signed)
Pt aware and new prescriptions sent to the pharmacy.

## 2011-02-11 ENCOUNTER — Telehealth: Payer: Self-pay | Admitting: Cardiovascular Disease

## 2011-02-11 DIAGNOSIS — E785 Hyperlipidemia, unspecified: Secondary | ICD-10-CM

## 2011-02-11 NOTE — Telephone Encounter (Signed)
lipitor 20 mg. [facility] [phone].

## 2011-02-11 NOTE — Telephone Encounter (Signed)
Left a message for pt to give me a call pt is requesting Lipitor  look as if these instructions  was documented in the last office visit Please stop your Lipitor and START PRAVASTATIN 80mg  by mouth daily.

## 2011-02-14 MED ORDER — PRAVASTATIN SODIUM 80 MG PO TABS: 80.0000 mg | ORAL_TABLET | Freq: Every evening | ORAL | Status: DC

## 2011-02-14 NOTE — Telephone Encounter (Signed)
Left a message again for pt to call me. Then spoke to pharmacy they state there is no prescription on fill i will send a prescription for Pravastatin 80 mg qd. Im guessing pt will call me if she has any problems

## 2011-03-18 ENCOUNTER — Other Ambulatory Visit: Payer: Self-pay | Admitting: Gastroenterology

## 2011-03-18 MED ORDER — PROMETHAZINE HCL 12.5 MG PO TABS: ORAL_TABLET | ORAL | Status: DC

## 2011-03-18 NOTE — Telephone Encounter (Signed)
CALLED PT TO INFORM SHE NEEDED AN APPOINTMENT, SCHEDULED FOR 05/08/2011 AT 2:45PM  WILL SEND IN RX REFILLS FOR PROMETHAZINE TODAY

## 2011-05-04 HISTORY — PX: ESOPHAGOGASTRODUODENOSCOPY: SHX1529

## 2011-05-08 ENCOUNTER — Ambulatory Visit (INDEPENDENT_AMBULATORY_CARE_PROVIDER_SITE_OTHER): Payer: Medicare Other | Admitting: Gastroenterology

## 2011-05-08 ENCOUNTER — Encounter: Payer: Self-pay | Admitting: Gastroenterology

## 2011-05-08 ENCOUNTER — Other Ambulatory Visit: Payer: Self-pay | Admitting: Gastroenterology

## 2011-05-08 ENCOUNTER — Other Ambulatory Visit (INDEPENDENT_AMBULATORY_CARE_PROVIDER_SITE_OTHER): Payer: Medicare Other

## 2011-05-08 DIAGNOSIS — L608 Other nail disorders: Secondary | ICD-10-CM

## 2011-05-08 DIAGNOSIS — R5383 Other fatigue: Secondary | ICD-10-CM

## 2011-05-08 DIAGNOSIS — R5381 Other malaise: Secondary | ICD-10-CM

## 2011-05-08 DIAGNOSIS — R109 Unspecified abdominal pain: Secondary | ICD-10-CM

## 2011-05-08 DIAGNOSIS — K5900 Constipation, unspecified: Secondary | ICD-10-CM

## 2011-05-08 DIAGNOSIS — K219 Gastro-esophageal reflux disease without esophagitis: Secondary | ICD-10-CM

## 2011-05-08 DIAGNOSIS — K140 Glossitis: Secondary | ICD-10-CM

## 2011-05-08 DIAGNOSIS — R159 Full incontinence of feces: Secondary | ICD-10-CM

## 2011-05-08 LAB — COMPREHENSIVE METABOLIC PANEL
Alkaline Phosphatase: 68 U/L (ref 39–117)
BUN: 7 mg/dL (ref 6–23)
Glucose, Bld: 100 mg/dL — ABNORMAL HIGH (ref 70–99)
Sodium: 138 mEq/L (ref 135–145)
Total Bilirubin: 0.6 mg/dL (ref 0.3–1.2)

## 2011-05-08 LAB — VITAMIN B12: Vitamin B-12: 909 pg/mL (ref 211–911)

## 2011-05-08 LAB — CBC WITH DIFFERENTIAL/PLATELET
Basophils Relative: 0.7 % (ref 0.0–3.0)
Eosinophils Absolute: 0.3 10*3/uL (ref 0.0–0.7)
Eosinophils Relative: 2.7 % (ref 0.0–5.0)
HCT: 41.8 % (ref 36.0–46.0)
Lymphs Abs: 5.6 10*3/uL — ABNORMAL HIGH (ref 0.7–4.0)
MCHC: 33.9 g/dL (ref 30.0–36.0)
MCV: 86.9 fl (ref 78.0–100.0)
Monocytes Absolute: 0.8 10*3/uL (ref 0.1–1.0)
Platelets: 316 10*3/uL (ref 150.0–400.0)
WBC: 12.7 10*3/uL — ABNORMAL HIGH (ref 4.5–10.5)

## 2011-05-08 LAB — PROTIME-INR
INR: 1.1 ratio — ABNORMAL HIGH (ref 0.8–1.0)
Prothrombin Time: 11.8 s (ref 10.2–12.4)

## 2011-05-08 MED ORDER — OMEPRAZOLE 20 MG PO CPDR: 20.0000 mg | DELAYED_RELEASE_CAPSULE | Freq: Every day | ORAL | Status: DC

## 2011-05-08 NOTE — Assessment & Plan Note (Addendum)
Because of monetary considerations patient will  change to omeprazole.

## 2011-05-08 NOTE — Assessment & Plan Note (Signed)
Patient is agreeable to undergo a barium swallow for further evaluation.

## 2011-05-08 NOTE — Assessment & Plan Note (Signed)
Patient will be referred to [facility] for anal manometry

## 2011-05-08 NOTE — Assessment & Plan Note (Signed)
This is a very recent problem and is likely due to functional constipation.  Recommendations #1 patient was instructed to use MiraLax 1-2 times a day as needed

## 2011-05-08 NOTE — Assessment & Plan Note (Signed)
Patient remains mildly symptomatic on erythromycin. She is unable to afford domperidone.

## 2011-05-08 NOTE — Progress Notes (Signed)
History of Present Illness:  [name] has returned for reevaluation of stool incontinence, nausea and abdominal bloating. She has gastroparesis and a history of fecal incontinence. She also has a history of rectal and vaginal prolapse. She was seen a year ago for postprandial nausea and distention while taking erythromycin. She is unable to afford domperidone.  Barium swallow was recommended because of dysphagia. She has a history of a stricture but has also undergone cervical spine surgery. Incontinence remains a problem although over the past week she's been complaining of severe constipation.  The patient has lost 10 pounds and is worried about malnutrition. She was told that she needs B12 supplementation.  CBC and CMET in May, 2012 were normal.    Review of Systems: Pertinent positive and negative review of systems were noted in the above HPI section. All other review of systems were otherwise negative.    Current Medications, Allergies, Past Medical History, Past Surgical History, Family History and Social History were reviewed in [employer] electronic medical record  Vital signs were reviewed in today's medical record. Physical Exam: General: Well developed , well nourished, no acute distress Head: Normocephalic and atraumatic Eyes:  sclerae anicteric, EOMI Ears: Normal auditory acuity Mouth: No deformity or lesions Lungs: Clear throughout to auscultation Heart: Regular rate and rhythm; no murmurs, rubs or bruits Abdomen: Soft, non tender and non distended. No masses, hepatosplenomegaly or hernias noted. Normal Bowel sounds Rectal: There are no external rectal abnormalities or evidence for prolapse Musculoskeletal: Symmetrical with no gross deformities  Pulses:  Normal pulses noted Extremities: No clubbing, cyanosis, edema or deformities noted Neurological: Alert oriented x 4, grossly nonfocal Psychological:  Alert and cooperative. Normal mood and affect

## 2011-05-08 NOTE — Patient Instructions (Addendum)
Your Barium Swallow Xray is scheduled at [facility] radiology on 05/10/2011 at 9am to arrive at 8:45am You will go to the basement for labs today We will refer you to Mechele Claude at [facility], We will contact you with that appointment when it becomes available You will need to use Miralax powder 17g in a 8oz glass of juice or water one to two times daily

## 2011-05-09 ENCOUNTER — Other Ambulatory Visit: Payer: Self-pay | Admitting: Internal Medicine

## 2011-05-09 ENCOUNTER — Telehealth: Payer: Self-pay

## 2011-05-09 NOTE — Progress Notes (Signed)
Quick Note:  Please inform the patient that lab work was normal and to continue current plan of action ______ 

## 2011-05-09 NOTE — Telephone Encounter (Signed)
Pt aware.

## 2011-05-09 NOTE — Telephone Encounter (Signed)
Left message for pt to call back  °

## 2011-05-09 NOTE — Telephone Encounter (Signed)
Message copied by Michele Mcalpine on Thu May 09, 2011 12:06 PM ------      Message from: Melvia Heaps MD D      Created: Thu May 09, 2011 11:57 AM       Please inform the patient that lab work was normal and to continue current plan of action

## 2011-05-10 ENCOUNTER — Ambulatory Visit (HOSPITAL_COMMUNITY)
Admission: RE | Admit: 2011-05-10 | Discharge: 2011-05-10 | Disposition: A | Discharge status: Home / Self Care | Payer: Medicare Other | Source: Ambulatory Visit | Attending: Gastroenterology | Admitting: Gastroenterology

## 2011-05-10 DIAGNOSIS — K219 Gastro-esophageal reflux disease without esophagitis: Secondary | ICD-10-CM

## 2011-05-10 DIAGNOSIS — R6889 Other general symptoms and signs: Secondary | ICD-10-CM | POA: Insufficient documentation

## 2011-05-10 DIAGNOSIS — K224 Dyskinesia of esophagus: Secondary | ICD-10-CM | POA: Insufficient documentation

## 2011-05-10 DIAGNOSIS — R5383 Other fatigue: Secondary | ICD-10-CM

## 2011-05-10 DIAGNOSIS — L608 Other nail disorders: Secondary | ICD-10-CM

## 2011-05-10 DIAGNOSIS — K140 Glossitis: Secondary | ICD-10-CM

## 2011-05-12 LAB — CAROTENE, SERUM: Carotene, Total-Serum: 5 ug/dL — ABNORMAL LOW (ref 6–77)

## 2011-05-13 ENCOUNTER — Telehealth: Payer: Self-pay

## 2011-05-13 NOTE — Telephone Encounter (Signed)
Left message for pt to call back  °

## 2011-05-13 NOTE — Telephone Encounter (Signed)
Message copied by Michele Mcalpine on Mon May 13, 2011  2:12 PM ------      Message from: Melvia Heaps MD D      Created: Mon May 13, 2011 12:47 PM       No obvious stricture or impingement related to her prior neck surgery.  It's probably workthwhile doing an EGD with dilation b/c of dysphagia

## 2011-05-15 NOTE — Telephone Encounter (Signed)
Pt aware. Pt schedule for egd with dil 05/23/11 in the [facility] arrival time 1pm, procedure time 2pm. Prep instructions mailed to pt. Pt aware of appt date and time.

## 2011-05-22 ENCOUNTER — Encounter: Payer: Self-pay | Admitting: Gastroenterology

## 2011-05-23 ENCOUNTER — Ambulatory Visit (AMBULATORY_SURGERY_CENTER): Payer: Medicare Other | Admitting: Gastroenterology

## 2011-05-23 ENCOUNTER — Encounter: Payer: Self-pay | Admitting: Gastroenterology

## 2011-05-23 DIAGNOSIS — R1310 Dysphagia, unspecified: Secondary | ICD-10-CM

## 2011-05-23 DIAGNOSIS — K222 Esophageal obstruction: Secondary | ICD-10-CM

## 2011-05-23 LAB — GLUCOSE, CAPILLARY: Glucose-Capillary: 100 mg/dL — ABNORMAL HIGH (ref 70–99)

## 2011-05-23 MED ORDER — HYDROCORTISONE 2.5 % EX CREA: TOPICAL_CREAM | CUTANEOUS | Status: AC

## 2011-05-23 MED ORDER — SODIUM CHLORIDE 0.9 % IV SOLN: 500.0000 mL | INTRAVENOUS | Status: DC

## 2011-05-23 NOTE — Patient Instructions (Signed)
FOLLOW DISCHARGE INSTRUCTIONS (BLUE & GREEN SHEETS).   FOLLOW DILATATION DIET GIVEN  CALL OFFICE MAKE 1 MONTH APPOINTMENT.

## 2011-05-24 ENCOUNTER — Telehealth: Payer: Self-pay

## 2011-05-24 NOTE — Telephone Encounter (Signed)
No ID on answering machine. 

## 2011-05-27 LAB — URINALYSIS, ROUTINE W REFLEX MICROSCOPIC
Bilirubin Urine: NEGATIVE
Ketones, ur: NEGATIVE
Nitrite: NEGATIVE
Protein, ur: NEGATIVE

## 2011-05-27 LAB — COMPREHENSIVE METABOLIC PANEL
Albumin: 3.5
Alkaline Phosphatase: 80
BUN: 4 — ABNORMAL LOW
Calcium: 9.1
Potassium: 3.5
Sodium: 138
Total Protein: 7.1

## 2011-05-27 LAB — CBC
HCT: 39.3
MCHC: 33.6
Platelets: 367
RDW: 15.3

## 2011-05-27 LAB — DIFFERENTIAL
Basophils Relative: 1
Lymphs Abs: 3.5
Monocytes Absolute: 0.7
Monocytes Relative: 6
Neutro Abs: 7.9 — ABNORMAL HIGH
Neutrophils Relative %: 64

## 2011-06-07 ENCOUNTER — Telehealth: Payer: Self-pay

## 2011-06-07 LAB — CBC
HCT: 38.4
Hemoglobin: 13.1
MCHC: 34.2
MCV: 81.5
RBC: 4.71
RDW: 14.9

## 2011-06-07 LAB — DIFFERENTIAL
Basophils Absolute: 0
Basophils Relative: 0
Eosinophils Relative: 1
Lymphs Abs: 3.9
Monocytes Absolute: 0.9
Neutro Abs: 9.9 — ABNORMAL HIGH
Neutrophils Relative %: 67

## 2011-06-07 LAB — POCT CARDIAC MARKERS: CKMB, poc: 1.2

## 2011-06-07 LAB — COMPREHENSIVE METABOLIC PANEL
BUN: 4 — ABNORMAL LOW
CO2: 23
Calcium: 9.1
Creatinine, Ser: 0.59
GFR calc non Af Amer: >60
Glucose, Bld: 93
Sodium: 133 — ABNORMAL LOW
Total Protein: 6.8

## 2011-06-07 NOTE — Telephone Encounter (Signed)
fluvirin 0.28ml Site Left arm IM Manufacturer [employer] Date Administered 06/06/2011 Lot Number [id number] Expiration date 02/01/2012 Pharmacy [facility] [address] [place] Phone number [phone]

## 2011-06-10 ENCOUNTER — Other Ambulatory Visit: Payer: Self-pay | Admitting: Internal Medicine

## 2011-06-12 LAB — COMPREHENSIVE METABOLIC PANEL
ALT: 19
Alkaline Phosphatase: 69
CO2: 28
Calcium: 9.1
GFR calc non Af Amer: >60
Glucose, Bld: 106 — ABNORMAL HIGH
Sodium: 126 — ABNORMAL LOW
Total Bilirubin: 0.6

## 2011-06-12 LAB — DIFFERENTIAL
Basophils Absolute: 0.1
Basophils Relative: 1
Eosinophils Absolute: 0
Lymphs Abs: 3
Neutrophils Relative %: 69

## 2011-06-12 LAB — CBC
Hemoglobin: 12.1
MCHC: 34.2
RBC: 4.31

## 2011-06-12 LAB — LIPASE, BLOOD: Lipase: 29

## 2011-06-24 ENCOUNTER — Other Ambulatory Visit: Payer: Self-pay | Admitting: Gastroenterology

## 2011-06-25 ENCOUNTER — Ambulatory Visit: Payer: Medicare Other | Admitting: Gastroenterology

## 2011-07-15 ENCOUNTER — Other Ambulatory Visit: Payer: Self-pay | Admitting: Cardiovascular Disease

## 2011-07-15 ENCOUNTER — Other Ambulatory Visit: Payer: Self-pay | Admitting: Gastroenterology

## 2011-07-15 ENCOUNTER — Other Ambulatory Visit: Payer: Self-pay | Admitting: Internal Medicine

## 2011-08-26 ENCOUNTER — Telehealth: Payer: Self-pay

## 2011-08-26 MED ORDER — DULOXETINE HCL 30 MG PO CPEP: 30.0000 mg | ORAL_CAPSULE | Freq: Two times a day (BID) | ORAL | Status: DC

## 2011-08-26 NOTE — Telephone Encounter (Signed)
Ok  To robin to handle

## 2011-08-26 NOTE — Telephone Encounter (Signed)
Filled the prescription requested, called the patient to inform numbers unable to contact due to incorrect number given by the patient.

## 2011-08-26 NOTE — Telephone Encounter (Signed)
Patient transferred her medical care to Dr. Christell Constant. She called their office to have her cymbalta refilled but cannot contact. She is requesting one fill from [facility] for  cymbalta to get her through christmas. Call back number [phone]

## 2011-09-13 DIAGNOSIS — E785 Hyperlipidemia, unspecified: Secondary | ICD-10-CM | POA: Insufficient documentation

## 2011-09-13 DIAGNOSIS — I10 Essential (primary) hypertension: Secondary | ICD-10-CM | POA: Insufficient documentation

## 2011-09-13 DIAGNOSIS — Z9049 Acquired absence of other specified parts of digestive tract: Secondary | ICD-10-CM | POA: Insufficient documentation

## 2011-09-13 DIAGNOSIS — M545 Low back pain, unspecified: Secondary | ICD-10-CM | POA: Insufficient documentation

## 2011-09-13 DIAGNOSIS — Z90710 Acquired absence of both cervix and uterus: Secondary | ICD-10-CM | POA: Insufficient documentation

## 2011-09-13 DIAGNOSIS — M47812 Spondylosis without myelopathy or radiculopathy, cervical region: Secondary | ICD-10-CM | POA: Insufficient documentation

## 2011-09-13 DIAGNOSIS — Z9889 Other specified postprocedural states: Secondary | ICD-10-CM | POA: Insufficient documentation

## 2011-09-13 DIAGNOSIS — G8929 Other chronic pain: Secondary | ICD-10-CM | POA: Insufficient documentation

## 2011-11-04 ENCOUNTER — Other Ambulatory Visit: Payer: Self-pay | Admitting: Internal Medicine

## 2011-11-20 ENCOUNTER — Ambulatory Visit: Payer: Medicare Other | Admitting: Cardiovascular Disease

## 2011-11-24 DIAGNOSIS — R7301 Impaired fasting glucose: Secondary | ICD-10-CM | POA: Insufficient documentation

## 2011-12-16 ENCOUNTER — Other Ambulatory Visit: Payer: Self-pay | Admitting: Cardiovascular Disease

## 2011-12-30 ENCOUNTER — Ambulatory Visit: Payer: Medicare Other | Admitting: Gastroenterology

## 2012-01-01 ENCOUNTER — Other Ambulatory Visit: Payer: Self-pay | Admitting: Gastroenterology

## 2012-02-10 ENCOUNTER — Ambulatory Visit: Payer: Medicare Other | Admitting: Gastroenterology

## 2012-02-13 ENCOUNTER — Telehealth: Payer: Self-pay | Admitting: Cardiovascular Disease

## 2012-02-13 NOTE — Telephone Encounter (Signed)
Pt states she has been having chest pain for several weeks and wants to see Dr. Excell Seltzer.  Pt refuses to go to the ED due to cost.  States she periodically takes ntg and gets some relief.  Advised pt to go to ED but refuses.  Made appt to see Tereso Newcomer on Wednesday 02/17/2012.  Again, advised pt that if chest pain continues, she should go to the ED.

## 2012-02-13 NOTE — Telephone Encounter (Signed)
New problem:  Patient calling C/O chest pain., nausea , headache patient took asa & nitro a few days ago. Patient states she cannot afford the emergency room. Would like to be seen today. Patient states she doing O.K. Feels like this is not normal.

## 2012-02-13 NOTE — Telephone Encounter (Signed)
Left message on answering machine. 

## 2012-02-19 ENCOUNTER — Ambulatory Visit: Payer: Medicare Other | Admitting: Physician Assistant

## 2012-03-03 ENCOUNTER — Ambulatory Visit (INDEPENDENT_AMBULATORY_CARE_PROVIDER_SITE_OTHER): Payer: Medicare Other | Admitting: Physician Assistant

## 2012-03-03 ENCOUNTER — Encounter: Payer: Self-pay | Admitting: Physician Assistant

## 2012-03-03 VITALS — BP 104/56 | HR 72 | Ht 65.0 in | Wt 189.0 lb

## 2012-03-03 DIAGNOSIS — R5383 Other fatigue: Secondary | ICD-10-CM

## 2012-03-03 DIAGNOSIS — R0602 Shortness of breath: Secondary | ICD-10-CM

## 2012-03-03 DIAGNOSIS — I10 Essential (primary) hypertension: Secondary | ICD-10-CM

## 2012-03-03 DIAGNOSIS — R5381 Other malaise: Secondary | ICD-10-CM

## 2012-03-03 DIAGNOSIS — J449 Chronic obstructive pulmonary disease, unspecified: Secondary | ICD-10-CM

## 2012-03-03 DIAGNOSIS — E785 Hyperlipidemia, unspecified: Secondary | ICD-10-CM

## 2012-03-03 DIAGNOSIS — B351 Tinea unguium: Secondary | ICD-10-CM | POA: Insufficient documentation

## 2012-03-03 DIAGNOSIS — I2510 Atherosclerotic heart disease of native coronary artery without angina pectoris: Secondary | ICD-10-CM

## 2012-03-03 MED ORDER — PRAVASTATIN SODIUM 80 MG PO TABS: 80.0000 mg | ORAL_TABLET | Freq: Every evening | ORAL | Status: DC

## 2012-03-03 MED ORDER — NITROGLYCERIN 0.4 MG SL SUBL: 0.4000 mg | SUBLINGUAL_TABLET | SUBLINGUAL | Status: DC | PRN

## 2012-03-03 NOTE — Patient Instructions (Addendum)
Your physician has requested that you have a lexiscan myoview DX SOB AND FATIGUE. For further information please visit [email or web address] Please follow instruction sheet, as given. 03/11/12 @ 12 PM  Your physician recommends that you return for lab work in: 03/06/12 @ THE [facility] ON [address] FROM [facility] YOU WILL NEED TO HAVE A BMET, CBC W/DIFF, AND TSH ALSO HAVE A CXR  PLEASE MAKE AN APPOINTMENT TO SEE DR. Excell Seltzer IN ABOUT 4-6 WEEKS

## 2012-03-03 NOTE — Progress Notes (Signed)
[address] [place], [place]  [address] Phone: [phone] Fax:  [phone]  Date:  03/03/2012   Name:  [name]   DOB:  July 16, 1946   MRN:  [id number]  PCP:  Kirke Corin, DO  Primary Cardiologist:  Dr. Tonny Bollman  Primary Electrophysiologist:  None    History of Present Illness: [name] is a 66 y.o. female who returns for evaluation of chest pain.  She has a history of CAD that has been managed medically, hypertension, hyperlipidemia, GERD, diabetes, chronic back pain, restless leg syndrome, anxiety and COPD.  Cardiac catheterization 08/2007: Proximal LAD 20-30%, OM to 75%, proximal RCA 20-30%, EF 60%.  Last Myoview 12/2008: No ischemia, EF 74%.  Last seen by Dr. Excell Seltzer 11/2010.  She actually presents today with multiple complaints.  She has had left-sided chest pain for the last 5-6 months.  It comes and goes.  It lasts for a matter of seconds.  She notes it at rest.  She also notes that with exertion.  She can exert herself without chest symptoms.  She does note dyspnea.  She feels as though her dyspnea with exertion is getting worse.  She describes class III symptoms.  She denies orthopnea or PND.  She has mild pedal edema.  She denies syncope.  She has occasional lightheadedness.  She has cervical disc disease.  She's had cervical spine surgery.  She does have pain that radiates to her neck.  She also has bilateral arm pain.  She feels as though her cervical disc disease is getting worse.   Past Medical History  Diagnosis Date  . Obesity   . Allergic   . Seizure   . Skin cancer   . Asthma   . Depression   . GERD (gastroesophageal reflux disease)   . Hypertension   . Tobacco user   . CAD (coronary artery disease)     Moderate disease of the left circumflex managed medically  . Fibromyalgia   . Chronic pain   . Dyslipidemia   . Diverticul disease small and large intestine, no perforati or abscess   . Gastroparesis   . Overactive  bladder   . Anxiety   . Hyperlipidemia   . Urge and stress incontinence   . Chronic cystitis   . Rectal prolapse     Current Outpatient Prescriptions  Medication Sig Dispense Refill  . albuterol-ipratropium (COMBIVENT) 18-103 MCG/ACT inhaler Inhale 2 puffs into the lungs every 6 (six) hours as needed.        Marland Kitchen amLODipine (NORVASC) 10 MG tablet Take 10 mg by mouth daily.       Marland Kitchen aspirin 81 MG EC tablet Take 81 mg by mouth daily.        . benazepril (LOTENSIN) 20 MG tablet Take 20 mg by mouth daily.       Marland Kitchen BYSTOLIC 10 MG tablet TAKE 1 TABLET BY MOUTH EVERY DAY  90 tablet  0  . clonazePAM (KLONOPIN) 2 MG tablet Take 0.5 mg by mouth 2 (two) times daily as needed.        . DULoxetine (CYMBALTA) 30 MG capsule Take 1 capsule (30 mg total) by mouth 2 (two) times daily.  60 capsule  1  . fish oil-omega-3 fatty acids 1000 MG capsule Take 2 g by mouth daily.        Marland Kitchen gabapentin (NEURONTIN) 300 MG capsule Take 600 mg by mouth. 1 TIME DAILY       .  glimepiride (AMARYL) 2 MG tablet Take 2 mg by mouth daily before breakfast.       . glycopyrrolate (ROBINUL) 2 MG tablet TAKE 1 TABLET BY MOUTH THREE TIMES DAILY AS NEEDED  90 tablet  0  . hydrocortisone (HYTONE) 2.5 % cream Apply to affected area 2 times daily  30 g  1  . hydrOXYzine (VISTARIL) 25 MG capsule Take 25 mg by mouth 3 (three) times daily as needed.        . lidocaine (LIDODERM) 5 % Place 1 patch onto the skin daily. Remove & Discard patch within 12 hours or as directed by MD       . lidocaine (XYLOCAINE) 2 % solution Take 20 mLs by mouth as needed.       . nitroGLYCERIN (NITROSTAT) 0.4 MG SL tablet Place 1 tablet (0.4 mg total) under the tongue every 5 (five) minutes as needed for chest pain.  90 tablet  3  . omeprazole (PRILOSEC) 20 MG capsule Take 1 capsule (20 mg total) by mouth daily.  60 capsule  2  . pantoprazole (PROTONIX) 40 MG tablet Take 40 mg by mouth daily.       . pravastatin (PRAVACHOL) 80 MG tablet Take 1 tablet (80 mg total)  by mouth every evening.  90 tablet  3  . prochlorperazine (COMPAZINE) 25 MG suppository Place 1 suppository (25 mg total) rectally every 12 (twelve) hours as needed for nausea.  12 suppository  0  . prochlorperazine (COMPAZINE) 25 MG suppository Place 1 suppository (25 mg total) rectally every 12 (twelve) hours as needed.  12 suppository  1  . promethazine (PHENERGAN) 12.5 MG tablet Take 1/2 by mouth every 6 hours as needed for nausea  60 tablet  1  . PROMETHEGAN 25 MG suppository INSERT ONE SUPPOSITORY RECTALLY EVERY 6 HOURS AS NEEDED FOR NAUSEA  20 suppository  1  . triamcinolone (NASACORT) 55 MCG/ACT nasal inhaler 2 sprays by Nasal route daily.        Marland Kitchen DISCONTD: NITROSTAT 0.4 MG SL tablet PLACE 1 TABLET UNDER THE TONGUE AT ONSET OF CHEST PAIN MAY REPEAR EVERY 5 MINUTES UP TO 3 DOSES  25 tablet  0  . DISCONTD: pravastatin (PRAVACHOL) 80 MG tablet Take 1 tablet (80 mg total) by mouth every evening.  30 tablet  11  . DISCONTD: pravastatin (PRAVACHOL) 80 MG tablet TAKE ONE TABLET BY MOUTH EVERY EVENING  30 tablet  10  . DISCONTD: pravastatin (PRAVACHOL) 80 MG tablet Take 80 mg by mouth every evening.        Allergies: Allergies  Allergen Reactions  . Januvia (Sitagliptin Phosphate) Nausea Only  . Latex   . Metformin     REACTION: Dizziness \\T \ Nausea  . Metoclopramide Hcl   . Penicillins   . Sulfonamide Derivatives     REACTION: Reaction not known    History  Substance Use Topics  . Smoking status: Current Everyday Smoker    Types: Cigarettes  . Smokeless tobacco: Never Used  . Alcohol Use: No     ROS:  Please see the history of present illness.   She has a chronic cough.  She has had productive sputum.  She has green sputum.  She has increased wheezing.  She denies fevers.  She denies hematochezia.  She denies hematuria.  She has had dark stools.  This is a chronic symptom.  She notes dry skin.  She also has brittle nails.  All other systems reviewed and negative.   PHYSICAL  EXAM: VS:  BP 104/56  Pulse 72  Ht 5\' 5"  (1.651 m)  Wt 189 lb (85.73 kg)  BMI 31.45 kg/m2 Well nourished, well developed, in no acute distress HEENT: normal Neck: no JVD Endocrine: No thyromegaly Vascular: No carotid bruits Cardiac:  normal S1, S2; RRR; no murmur Lungs:  clear to auscultation bilaterally, no wheezing, rhonchi or rales Abd: soft, nontender, no hepatomegaly Ext: no edema; Bilateral finger nails with onychomycosis Skin: warm and dry Neuro:  CNs 2-12 intact, no focal abnormalities noted  EKG:  Sinus rhythm, heart rate 72, normal axis, no acute changes      ASSESSMENT AND PLAN:  1.  Chest pain Atypical.  It has been almost 3 years since her last assessment for ischemia.  It is reasonable to proceed with a followup stress test.  Arrange a Lexiscan Myoview. She also continues to smoke.  She has a cough.  I will also check a chest x-ray.  She is actually requesting antibiotics today.  If she has an infiltrate on her chest x-ray, I will place her on antibiotics.  2.  Dyspnea Obtain Myoview and chest x-ray as noted.  3.  Fatigue She describes fatigue and decreased exercise tolerance with her symptoms. Obtain Myoview and chest x-ray as noted. I will also obtain labs: Basic metabolic panel, CBC and TSH.  4.  Chronic pain She is requesting a letter to go to her insurance company to request 24-hour care while her husband is away being evaluated in [facility]. I explained that it is more appropriate for this to come from her primary care physician.  5.  Onychomycosis She is requesting treatment for me.  I explained to her that this really should be prescribed by her primary care provider.  6.  Hypertension Controlled.  7.  Hyperlipidemia Managed by her PCP.  8.  COPD She knows that she needs to quit smoking.   Luna Glasgow, PA-C  5:43 PM 03/03/2012

## 2012-03-09 ENCOUNTER — Other Ambulatory Visit (INDEPENDENT_AMBULATORY_CARE_PROVIDER_SITE_OTHER): Payer: Medicare Other

## 2012-03-09 DIAGNOSIS — R0602 Shortness of breath: Secondary | ICD-10-CM

## 2012-03-09 DIAGNOSIS — R0989 Other specified symptoms and signs involving the circulatory and respiratory systems: Secondary | ICD-10-CM

## 2012-03-09 LAB — CBC WITH DIFFERENTIAL/PLATELET
Eosinophils Relative: 2 % (ref 0.0–5.0)
Lymphocytes Relative: 37.2 % (ref 12.0–46.0)
MCV: 88.8 fl (ref 78.0–100.0)
Monocytes Absolute: 0.7 10*3/uL (ref 0.1–1.0)
Neutrophils Relative %: 54.5 % (ref 43.0–77.0)
Platelets: 306 10*3/uL (ref 150.0–400.0)
WBC: 12.2 10*3/uL — ABNORMAL HIGH (ref 4.5–10.5)

## 2012-03-09 LAB — BASIC METABOLIC PANEL
BUN: 7 mg/dL (ref 6–23)
Calcium: 9.3 mg/dL (ref 8.4–10.5)
Chloride: 102 mEq/L (ref 96–112)
Creatinine, Ser: 0.9 mg/dL (ref 0.4–1.2)
GFR: 71.59 mL/min (ref >60.00)

## 2012-03-09 LAB — TSH: TSH: 1.43 u[IU]/mL (ref 0.35–5.50)

## 2012-03-10 ENCOUNTER — Telehealth: Payer: Self-pay | Admitting: *Deleted

## 2012-03-10 NOTE — Telephone Encounter (Signed)
Message copied by Tarri Fuller on Tue Mar 10, 2012  3:43 PM ------      Message from: [place], [place] T      Created: Mon Mar 09, 2012 10:33 PM       Labs ok      WBC count is high        -  Ask if she is taking or recently took steroids.        -  Make sure she gets CXR done.        -  Ask if she is having dysuria or any rashes.      Tereso Newcomer, PA-C  10:32 PM 03/09/2012

## 2012-03-10 NOTE — Telephone Encounter (Signed)
lmom ptcb to go over lab results and to see if she had cxr done

## 2012-03-11 ENCOUNTER — Ambulatory Visit (HOSPITAL_COMMUNITY): Payer: Medicare Other | Attending: Cardiovascular Disease | Admitting: Radiology

## 2012-03-11 ENCOUNTER — Telehealth: Payer: Self-pay | Admitting: *Deleted

## 2012-03-11 VITALS — BP 81/49 | HR 62 | Ht 65.0 in | Wt 186.0 lb

## 2012-03-11 DIAGNOSIS — F17200 Nicotine dependence, unspecified, uncomplicated: Secondary | ICD-10-CM | POA: Insufficient documentation

## 2012-03-11 DIAGNOSIS — R61 Generalized hyperhidrosis: Secondary | ICD-10-CM | POA: Insufficient documentation

## 2012-03-11 DIAGNOSIS — I10 Essential (primary) hypertension: Secondary | ICD-10-CM | POA: Insufficient documentation

## 2012-03-11 DIAGNOSIS — E785 Hyperlipidemia, unspecified: Secondary | ICD-10-CM | POA: Insufficient documentation

## 2012-03-11 DIAGNOSIS — R0989 Other specified symptoms and signs involving the circulatory and respiratory systems: Secondary | ICD-10-CM | POA: Insufficient documentation

## 2012-03-11 DIAGNOSIS — R0789 Other chest pain: Secondary | ICD-10-CM | POA: Insufficient documentation

## 2012-03-11 DIAGNOSIS — R5383 Other fatigue: Secondary | ICD-10-CM

## 2012-03-11 DIAGNOSIS — R0602 Shortness of breath: Secondary | ICD-10-CM | POA: Insufficient documentation

## 2012-03-11 DIAGNOSIS — R000 Tachycardia, unspecified: Secondary | ICD-10-CM | POA: Insufficient documentation

## 2012-03-11 DIAGNOSIS — R002 Palpitations: Secondary | ICD-10-CM | POA: Insufficient documentation

## 2012-03-11 DIAGNOSIS — R110 Nausea: Secondary | ICD-10-CM | POA: Insufficient documentation

## 2012-03-11 DIAGNOSIS — R0609 Other forms of dyspnea: Secondary | ICD-10-CM | POA: Insufficient documentation

## 2012-03-11 DIAGNOSIS — E663 Overweight: Secondary | ICD-10-CM | POA: Insufficient documentation

## 2012-03-11 DIAGNOSIS — Z8249 Family history of ischemic heart disease and other diseases of the circulatory system: Secondary | ICD-10-CM | POA: Insufficient documentation

## 2012-03-11 DIAGNOSIS — R079 Chest pain, unspecified: Secondary | ICD-10-CM

## 2012-03-11 DIAGNOSIS — R5381 Other malaise: Secondary | ICD-10-CM | POA: Insufficient documentation

## 2012-03-11 DIAGNOSIS — I2510 Atherosclerotic heart disease of native coronary artery without angina pectoris: Secondary | ICD-10-CM | POA: Insufficient documentation

## 2012-03-11 DIAGNOSIS — R42 Dizziness and giddiness: Secondary | ICD-10-CM | POA: Insufficient documentation

## 2012-03-11 MED ORDER — REGADENOSON 0.4 MG/5ML IV SOLN
0.4000 mg | Freq: Once | INTRAVENOUS | Status: AC
  Administered 2012-03-11: 0.4 mg via INTRAVENOUS

## 2012-03-11 MED ORDER — TECHNETIUM TC 99M TETROFOSMIN IV KIT
11.0000 | PACK | Freq: Once | INTRAVENOUS | Status: AC | PRN
  Administered 2012-03-11: 11 via INTRAVENOUS

## 2012-03-11 MED ORDER — TECHNETIUM TC 99M TETROFOSMIN IV KIT
33.0000 | PACK | Freq: Once | INTRAVENOUS | Status: AC | PRN
  Administered 2012-03-11: 33 via INTRAVENOUS

## 2012-03-11 NOTE — Telephone Encounter (Signed)
lmom x 3 for ptcb to go over lab results and to also see why she did not have the CXR done as she was scheduled to have done @ the [place] office when she had her lab work.

## 2012-03-11 NOTE — Telephone Encounter (Signed)
Message copied by Tarri Fuller on Wed Mar 11, 2012  2:52 PM ------      Message from: [place], [place] T      Created: Mon Mar 09, 2012 10:33 PM       Labs ok      WBC count is high        -  Ask if she is taking or recently took steroids.        -  Make sure she gets CXR done.        -  Ask if she is having dysuria or any rashes.      Tereso Newcomer, PA-C  10:32 PM 03/09/2012

## 2012-03-11 NOTE — Progress Notes (Signed)
[facility] SITE 3 NUCLEAR MED [address] [place] [address] [phone]  Cardiology Nuclear Med Study  [name] is a 66 y.o. female     MRN : [id number]     DOB: 05-19-1946  Procedure Date: 03/11/2012  Nuclear Med Background Indication for Stress Test:  Evaluation for Ischemia History:  '08 Cath:N/O CAD, EF=60%; '10 MPS:No ischemia, EF=74% Cardiac Risk Factors: Family History - CAD, Hypertension, Lipids, Overweight and Smoker  Symptoms:  Chest Pain/Pressure>Neck with and without Exertion (last episode of chest discomfort was about 2-weeks ago), Diaphoresis, Dizziness, DOE/SOB, Fatigue, Light-Headedness, Nausea, Palpitations and Rapid HR    Nuclear Pre-Procedure Caffeine/Decaff Intake:  None NPO After: 12:00am   Lungs:  clear O2 Sat: 95% on room air. IV 0.9% NS with Angio Cath:  20g  IV Site: R Antecubital  IV Started by:  Stanton Kidney, EMT-P  Chest Size (in):  42 Cup Size: D  Height: 5\' 5"  (1.651 m)  Weight:  186 lb (84.369 kg)  BMI:  Body mass index is 30.95 kg/(m^2). Tech Comments:  Meds were taken at 7am, per patient.    Nuclear Med Study 1 or 2 day study: 1 day  Stress Test Type:  Lexiscan  Reading MD: Charlton Haws, MD  Order Authorizing Provider:  Tereso Newcomer, PA-C; Tonny Bollman, MD  Resting Radionuclide: Technetium 61m Tetrofosmin  Resting Radionuclide Dose: 11.0 mCi   Stress Radionuclide:  Technetium 25m Tetrofosmin  Stress Radionuclide Dose: 33.0 mCi           Stress Protocol Rest HR: 62 Stress HR: 66  Rest BP: Sitting 81/49  Standing 90/55 Trendelenburg 97/49 Stress BP: 101/47  Exercise Time (min): n/a METS: n/a   Predicted Max HR: 157 bpm % Max HR: 42.04 bpm Rate Pressure Product: 6666   Dose of Adenosine (mg):  n/a Dose of Lexiscan: 0.4 mg  Dose of Atropine (mg): n/a Dose of Dobutamine: n/a mcg/kg/min (at max HR)  Stress Test Technologist: Smiley Houseman, CMA-N  Nuclear Technologist:  Domenic Polite, CNMT     Rest  Procedure:  Myocardial perfusion imaging was performed at rest 45 minutes following the intravenous administration of Technetium 41m Tetrofosmin.  Rest ECG: No acute changes  Stress Procedure:  The patient received IV Lexiscan 0.4 mg over 15-seconds.  Technetium 20m Tetrofosmin injected at 30-seconds.  There was a brief episode of 2nd degree AVB type II with Lexiscan.  She c/o chest tightness, 7/10, and nausea with Lexiscan.  Symptoms were relieved 10-minutes into recovery.  Quantitative spect images were obtained after a 45 minute delay.  Stress ECG: No significant change from baseline ECG  QPS Raw Data Images:  Normal; no motion artifact; normal heart/lung ratio. Stress Images:  Normal homogeneous uptake in all areas of the myocardium. Rest Images:  Normal homogeneous uptake in all areas of the myocardium. Subtraction (SDS):  Normal Transient Ischemic Dilatation (Normal <1.22):  1.12 Lung/Heart Ratio (Normal <0.45):  0.38  Quantitative Gated Spect Images QGS EDV:  58 ml QGS ESV:  18 ml  Impression Exercise Capacity:  Lexiscan with no exercise. BP Response:  Normal blood pressure response. Clinical Symptoms:  No significant symptoms noted. ECG Impression:  No significant ST segment change suggestive of ischemia. Comparison with Prior Nuclear Study: No images to compare  Overall Impression:  Normal stress nuclear study.  LV Ejection Fraction: 69%.  LV Wall Motion:  NL LV Function; NL Wall Motion   Charlton Haws

## 2012-03-12 ENCOUNTER — Telehealth: Payer: Self-pay | Admitting: *Deleted

## 2012-03-12 ENCOUNTER — Encounter: Payer: Self-pay | Admitting: *Deleted

## 2012-03-12 NOTE — Telephone Encounter (Signed)
I have lm x 4 on the home # and x 1 on cell #. I have mailed out a letter today to the pt with results and for pt to still have the CXR done.

## 2012-03-12 NOTE — Telephone Encounter (Signed)
Message copied by Tarri Fuller on Thu Mar 12, 2012  2:41 PM ------      Message from: [place], [place] T      Created: Thu Mar 12, 2012  1:45 PM       Please inform patient stress test normal.      Tereso Newcomer, PA-C  1:44 PM 03/12/2012

## 2012-03-12 NOTE — Telephone Encounter (Signed)
s/w pt's husband and he is aware of normal stress test results. I did state that I have left several messages for other results and to please have the ptcb, pt was resting right now.

## 2012-03-12 NOTE — Telephone Encounter (Signed)
Message copied by Tarri Fuller on Thu Mar 12, 2012 12:03 PM ------      Message from: [place], [place] T      Created: Mon Mar 09, 2012 10:33 PM       Labs ok      WBC count is high        -  Ask if she is taking or recently took steroids.        -  Make sure she gets CXR done.        -  Ask if she is having dysuria or any rashes.      Tereso Newcomer, PA-C  10:32 PM 03/09/2012

## 2012-03-13 NOTE — Telephone Encounter (Signed)
Pt's husband aware of normal stress test results. I did state that I have left several messages for other results and to please have the ptcb, pt was resting right now.

## 2012-03-16 ENCOUNTER — Telehealth: Payer: Self-pay | Admitting: Cardiovascular Disease

## 2012-03-16 ENCOUNTER — Encounter: Payer: Self-pay | Admitting: Gastroenterology

## 2012-03-16 NOTE — Telephone Encounter (Signed)
Patient returning nurse call, she can be reached at [phone]

## 2012-03-16 NOTE — Telephone Encounter (Signed)
Pt rtn your call pls try cell if you don't reach her on her home phone

## 2012-03-16 NOTE — Telephone Encounter (Signed)
ERROR

## 2012-03-18 NOTE — Telephone Encounter (Signed)
s/w pt today after pt had rcvd results ltr that I had sent out. told pt that I had been trying to reach her. see documentation note for the rest of note.  I asked pt if she had been on any steroids recently or if she has been feeling bad and she states yes she has been feeling bad. Pt states she has sores and when I asked her what kind of sores she states it is Keratosis, also states yes she has had some dysuria and says it burns all in the vaginal area with urination. Pt also states she has had a cough for a while and had some green/brown mucus up until last week which is better now. Pt also states her fingernails are dying off.

## 2012-03-18 NOTE — Telephone Encounter (Signed)
Pt would like a return call today, has been calling since Monday for her results

## 2012-03-18 NOTE — Telephone Encounter (Signed)
She needs to follow up with her PCP and send copy of CBC to PCP. Tereso Newcomer, PA-C  9:15 PM 03/18/2012

## 2012-03-18 NOTE — Telephone Encounter (Signed)
s/w pt today after pt had rcvd results ltr that I had sent out. told pt that I had been trying to reach her. see documentation note for the rest of note.  I asked pt if she had been on any steroids recently or if she has been feeling bad and she states yes she has been feeling bad. Pt states she has sores and when I asked her what kind of sores she states it is Keratosis, also states yes she has had some dysuria and says it burns all in the vaginal area with urination. Pt also states she has had a cough for a while and had some green/brown mucus up until last week which is better now. Pt also states her fingernails are dying off.  

## 2012-03-19 NOTE — Telephone Encounter (Signed)
Advised pt yesterday to see PCP and I sent labs over as well yesterday. I read your mind!

## 2012-04-14 ENCOUNTER — Ambulatory Visit: Payer: Medicare Other | Admitting: Cardiovascular Disease

## 2012-04-16 ENCOUNTER — Other Ambulatory Visit: Payer: Self-pay | Admitting: Gastroenterology

## 2012-04-27 ENCOUNTER — Encounter: Payer: Self-pay | Admitting: Gastroenterology

## 2012-04-27 ENCOUNTER — Ambulatory Visit (INDEPENDENT_AMBULATORY_CARE_PROVIDER_SITE_OTHER)
Admission: RE | Admit: 2012-04-27 | Discharge: 2012-04-27 | Disposition: A | Discharge status: Home / Self Care | Payer: Medicare Other | Source: Ambulatory Visit | Attending: Gastroenterology | Admitting: Gastroenterology

## 2012-04-27 ENCOUNTER — Ambulatory Visit (INDEPENDENT_AMBULATORY_CARE_PROVIDER_SITE_OTHER): Payer: Medicare Other | Admitting: Gastroenterology

## 2012-04-27 VITALS — BP 122/64 | HR 84 | Ht 64.25 in | Wt 192.1 lb

## 2012-04-27 DIAGNOSIS — R109 Unspecified abdominal pain: Secondary | ICD-10-CM

## 2012-04-27 DIAGNOSIS — R159 Full incontinence of feces: Secondary | ICD-10-CM

## 2012-04-27 DIAGNOSIS — R1310 Dysphagia, unspecified: Secondary | ICD-10-CM

## 2012-04-27 DIAGNOSIS — K3184 Gastroparesis: Secondary | ICD-10-CM

## 2012-04-27 NOTE — Assessment & Plan Note (Signed)
Refer to colorectal surgery

## 2012-04-27 NOTE — Assessment & Plan Note (Signed)
Status post esophageal dilatation with no current complaints of dysphagia. 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

## 2012-04-27 NOTE — Assessment & Plan Note (Signed)
Persistent nausea off all medications. She is willing to pay for domperidone. However, I would first try her on gastroparesis diet. Failing this, we will start domperidone  .

## 2012-04-27 NOTE — Progress Notes (Signed)
Quick Note:  Please inform the patient that chest x-ray was unremarkable and to continue current plan of action ______

## 2012-04-27 NOTE — Assessment & Plan Note (Signed)
Pain is nonspecific. Will continue anticholinergics as needed.

## 2012-04-27 NOTE — Progress Notes (Signed)
History of Present Illness:  [name] continues to complain of constant nausea and intermittent stool incontinence. She is either incontinent of loose stools or is constipated. She has diffuse very nonspecific and sporadic abdominal pain. She did not improved with erythromycin. She was unable to afford domperidone.    Review of Systems: Pertinent positive and negative review of systems were noted in the above HPI section. All other review of systems were otherwise negative.    Current Medications, Allergies, Past Medical History, Past Surgical History, Family History and Social History were reviewed in [employer] [facility] electronic medical record  Vital signs were reviewed in today's medical record. Physical Exam: General: Well developed , well nourished, no acute distress On abdominal exam there are no dominant masses, tenderness organomegaly

## 2012-04-27 NOTE — Patient Instructions (Addendum)
You have been given a separate informational sheet regarding your tobacco use, the importance of quitting and local resources to help you quit. We have contacted [facility] for your consult appointment When they call us with that appointment we will call you with that information If you have questions for their office you can contact them at  [phone] Gastroparesis information has been given

## 2012-04-29 ENCOUNTER — Telehealth: Payer: Self-pay | Admitting: *Deleted

## 2012-04-29 NOTE — Telephone Encounter (Signed)
Called [place] at [facility] to inform her that Dr Arlyce Dice wants her to see the new Colorectal surgeon. With the diagnosis of Fecal Incontaneance

## 2012-04-29 NOTE — Telephone Encounter (Signed)
Dr Arlyce Dice, [facility] called and said that we need another dx for her referral besides fecal incontinence They do not have a colo/rectal surgeon that sees patients for that dx.

## 2012-04-29 NOTE — Telephone Encounter (Signed)
Dr. Janee Morn is the new colorectal surgeon who does see patients for that diagnosis which areought   to be adequate for a referral

## 2012-04-30 NOTE — Telephone Encounter (Signed)
Tried to contact patient to inform her about [facility]   Mailbox on patients phone is full

## 2012-04-30 NOTE — Telephone Encounter (Signed)
She can try Milana Kidney at [facility]

## 2012-04-30 NOTE — Telephone Encounter (Signed)
Spoke with patient, she stated she said she does not want to see a new surgeon  She wants a doctor who is experienced.   She wants to know if there is any doctors at [facility]  She can see

## 2012-05-01 NOTE — Telephone Encounter (Signed)
Called pt to inform that we was going to refer her to [facility] next week When the appointment becomes available will call her back She was greatful.Marland Kitchen

## 2012-05-11 NOTE — Telephone Encounter (Signed)
Spoke with Corrie Dandy, Will fax paperwork today att Corrie Dandy at [phone] Corrie Dandy stated she would give to the Colorectal team for review

## 2012-06-01 NOTE — Telephone Encounter (Signed)
Tried to contact patient to see if she has heard from [place]. Can not get in touch with patient Cell number not available, not in working area Home phone disconnected

## 2012-06-04 ENCOUNTER — Other Ambulatory Visit: Payer: Self-pay | Admitting: Gastroenterology

## 2012-06-05 NOTE — Telephone Encounter (Signed)
FYI- DR Richarda Blade CONTACTED PT ABOUT HER REFERRAL APPOINTMENT. BUT PT CANCELLED AT THIS TIME

## 2012-07-07 ENCOUNTER — Other Ambulatory Visit: Payer: Self-pay | Admitting: Gastroenterology

## 2012-08-10 ENCOUNTER — Emergency Department (HOSPITAL_COMMUNITY)
Admission: EM | Admit: 2012-08-10 | Discharge: 2012-08-11 | Disposition: A | Discharge status: Other Acute Inpatient Hospital | Payer: Medicare Other | Source: Home / Self Care | Attending: Emergency Medicine | Admitting: Emergency Medicine

## 2012-08-10 ENCOUNTER — Encounter (HOSPITAL_COMMUNITY): Payer: Self-pay

## 2012-08-10 DIAGNOSIS — F329 Major depressive disorder, single episode, unspecified: Secondary | ICD-10-CM | POA: Insufficient documentation

## 2012-08-10 DIAGNOSIS — F411 Generalized anxiety disorder: Secondary | ICD-10-CM | POA: Insufficient documentation

## 2012-08-10 DIAGNOSIS — F3289 Other specified depressive episodes: Secondary | ICD-10-CM | POA: Insufficient documentation

## 2012-08-10 DIAGNOSIS — I2510 Atherosclerotic heart disease of native coronary artery without angina pectoris: Secondary | ICD-10-CM | POA: Insufficient documentation

## 2012-08-10 DIAGNOSIS — IMO0001 Reserved for inherently not codable concepts without codable children: Secondary | ICD-10-CM | POA: Insufficient documentation

## 2012-08-10 DIAGNOSIS — Z9889 Other specified postprocedural states: Secondary | ICD-10-CM | POA: Insufficient documentation

## 2012-08-10 DIAGNOSIS — F3130 Bipolar disorder, current episode depressed, mild or moderate severity, unspecified: Secondary | ICD-10-CM | POA: Diagnosis not present

## 2012-08-10 DIAGNOSIS — Z87448 Personal history of other diseases of urinary system: Secondary | ICD-10-CM | POA: Insufficient documentation

## 2012-08-10 DIAGNOSIS — J45909 Unspecified asthma, uncomplicated: Secondary | ICD-10-CM | POA: Insufficient documentation

## 2012-08-10 DIAGNOSIS — Z79899 Other long term (current) drug therapy: Secondary | ICD-10-CM | POA: Insufficient documentation

## 2012-08-10 DIAGNOSIS — K219 Gastro-esophageal reflux disease without esophagitis: Secondary | ICD-10-CM | POA: Insufficient documentation

## 2012-08-10 DIAGNOSIS — E669 Obesity, unspecified: Secondary | ICD-10-CM | POA: Insufficient documentation

## 2012-08-10 DIAGNOSIS — Z8719 Personal history of other diseases of the digestive system: Secondary | ICD-10-CM | POA: Insufficient documentation

## 2012-08-10 DIAGNOSIS — F319 Bipolar disorder, unspecified: Secondary | ICD-10-CM | POA: Insufficient documentation

## 2012-08-10 DIAGNOSIS — N3020 Other chronic cystitis without hematuria: Secondary | ICD-10-CM | POA: Insufficient documentation

## 2012-08-10 DIAGNOSIS — Z9109 Other allergy status, other than to drugs and biological substances: Secondary | ICD-10-CM | POA: Insufficient documentation

## 2012-08-10 DIAGNOSIS — F17200 Nicotine dependence, unspecified, uncomplicated: Secondary | ICD-10-CM | POA: Insufficient documentation

## 2012-08-10 DIAGNOSIS — K3184 Gastroparesis: Secondary | ICD-10-CM | POA: Insufficient documentation

## 2012-08-10 DIAGNOSIS — F22 Delusional disorders: Secondary | ICD-10-CM | POA: Insufficient documentation

## 2012-08-10 DIAGNOSIS — G40909 Epilepsy, unspecified, not intractable, without status epilepticus: Secondary | ICD-10-CM | POA: Insufficient documentation

## 2012-08-10 DIAGNOSIS — I10 Essential (primary) hypertension: Secondary | ICD-10-CM | POA: Insufficient documentation

## 2012-08-10 DIAGNOSIS — Z7982 Long term (current) use of aspirin: Secondary | ICD-10-CM | POA: Insufficient documentation

## 2012-08-10 DIAGNOSIS — G478 Other sleep disorders: Secondary | ICD-10-CM | POA: Insufficient documentation

## 2012-08-10 DIAGNOSIS — G8929 Other chronic pain: Secondary | ICD-10-CM | POA: Insufficient documentation

## 2012-08-10 DIAGNOSIS — E785 Hyperlipidemia, unspecified: Secondary | ICD-10-CM | POA: Insufficient documentation

## 2012-08-10 LAB — BASIC METABOLIC PANEL
CO2: 25 mEq/L (ref 19–32)
Chloride: 102 mEq/L (ref 96–112)
GFR calc non Af Amer: 89 mL/min — ABNORMAL LOW (ref >90)
Glucose, Bld: 103 mg/dL — ABNORMAL HIGH (ref 70–99)
Potassium: 3.3 mEq/L — ABNORMAL LOW (ref 3.5–5.1)
Sodium: 140 mEq/L (ref 135–145)

## 2012-08-10 LAB — CBC WITH DIFFERENTIAL/PLATELET
Basophils Relative: 1 % (ref 0–1)
Eosinophils Relative: 1 % (ref 0–5)
HCT: 42.8 % (ref 36.0–46.0)
Hemoglobin: 14.8 g/dL (ref 12.0–15.0)
Lymphocytes Relative: 26 % (ref 12–46)
Lymphs Abs: 3.7 10*3/uL (ref 0.7–4.0)
MCV: 82.3 fL (ref 78.0–100.0)
Monocytes Relative: 6 % (ref 3–12)
Neutro Abs: 9.6 10*3/uL — ABNORMAL HIGH (ref 1.7–7.7)
WBC: 14.4 10*3/uL — ABNORMAL HIGH (ref 4.0–10.5)

## 2012-08-10 LAB — RAPID URINE DRUG SCREEN, HOSP PERFORMED
Benzodiazepines: NOT DETECTED
Cocaine: NOT DETECTED
Opiates: NOT DETECTED
Tetrahydrocannabinol: NOT DETECTED

## 2012-08-10 NOTE — ED Notes (Signed)
Patient admits to having SI "several times" recently. "But I would never act on it. I know that it is not the way to go" Patient cooperative with staff at this time. Appears sad.

## 2012-08-10 NOTE — ED Notes (Signed)
PT denies any SI or HI
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
## 2012-08-10 NOTE — ED Notes (Signed)
Pt reports her daughter called EMS because she was having severe abd and lower back pain.  When asked pt when the pain started, she repeatedly says, for a while.  Pt alert but does not answer questions directly.

## 2012-08-11 ENCOUNTER — Inpatient Hospital Stay (HOSPITAL_COMMUNITY)
Admission: EM | Admit: 2012-08-11 | Discharge: 2012-08-16 | DRG: 885 | Disposition: A | Discharge status: Home / Self Care | Payer: Medicare Other | Source: Ambulatory Visit | Attending: Psychiatry | Admitting: Psychiatry

## 2012-08-11 ENCOUNTER — Encounter (HOSPITAL_COMMUNITY): Payer: Self-pay | Admitting: *Deleted

## 2012-08-11 DIAGNOSIS — Z79899 Other long term (current) drug therapy: Secondary | ICD-10-CM | POA: Diagnosis not present

## 2012-08-11 DIAGNOSIS — R300 Dysuria: Secondary | ICD-10-CM

## 2012-08-11 DIAGNOSIS — J449 Chronic obstructive pulmonary disease, unspecified: Secondary | ICD-10-CM

## 2012-08-11 DIAGNOSIS — R1310 Dysphagia, unspecified: Secondary | ICD-10-CM

## 2012-08-11 DIAGNOSIS — R42 Dizziness and giddiness: Secondary | ICD-10-CM

## 2012-08-11 DIAGNOSIS — Z8719 Personal history of other diseases of the digestive system: Secondary | ICD-10-CM

## 2012-08-11 DIAGNOSIS — K3184 Gastroparesis: Secondary | ICD-10-CM | POA: Diagnosis present

## 2012-08-11 DIAGNOSIS — Z8659 Personal history of other mental and behavioral disorders: Secondary | ICD-10-CM

## 2012-08-11 DIAGNOSIS — Z85828 Personal history of other malignant neoplasm of skin: Secondary | ICD-10-CM

## 2012-08-11 DIAGNOSIS — S8010XA Contusion of unspecified lower leg, initial encounter: Secondary | ICD-10-CM

## 2012-08-11 DIAGNOSIS — Z23 Encounter for immunization: Secondary | ICD-10-CM

## 2012-08-11 DIAGNOSIS — N318 Other neuromuscular dysfunction of bladder: Secondary | ICD-10-CM

## 2012-08-11 DIAGNOSIS — R159 Full incontinence of feces: Secondary | ICD-10-CM

## 2012-08-11 DIAGNOSIS — IMO0001 Reserved for inherently not codable concepts without codable children: Secondary | ICD-10-CM

## 2012-08-11 DIAGNOSIS — E785 Hyperlipidemia, unspecified: Secondary | ICD-10-CM | POA: Diagnosis present

## 2012-08-11 DIAGNOSIS — N3946 Mixed incontinence: Secondary | ICD-10-CM | POA: Diagnosis present

## 2012-08-11 DIAGNOSIS — F341 Dysthymic disorder: Secondary | ICD-10-CM

## 2012-08-11 DIAGNOSIS — J45909 Unspecified asthma, uncomplicated: Secondary | ICD-10-CM | POA: Diagnosis present

## 2012-08-11 DIAGNOSIS — N816 Rectocele: Secondary | ICD-10-CM

## 2012-08-11 DIAGNOSIS — L738 Other specified follicular disorders: Secondary | ICD-10-CM

## 2012-08-11 DIAGNOSIS — I10 Essential (primary) hypertension: Secondary | ICD-10-CM

## 2012-08-11 DIAGNOSIS — F319 Bipolar disorder, unspecified: Secondary | ICD-10-CM | POA: Diagnosis present

## 2012-08-11 DIAGNOSIS — E669 Obesity, unspecified: Secondary | ICD-10-CM

## 2012-08-11 DIAGNOSIS — F411 Generalized anxiety disorder: Secondary | ICD-10-CM

## 2012-08-11 DIAGNOSIS — M542 Cervicalgia: Secondary | ICD-10-CM

## 2012-08-11 DIAGNOSIS — F329 Major depressive disorder, single episode, unspecified: Secondary | ICD-10-CM

## 2012-08-11 DIAGNOSIS — K589 Irritable bowel syndrome without diarrhea: Secondary | ICD-10-CM

## 2012-08-11 DIAGNOSIS — K209 Esophagitis, unspecified without bleeding: Secondary | ICD-10-CM

## 2012-08-11 DIAGNOSIS — R109 Unspecified abdominal pain: Secondary | ICD-10-CM

## 2012-08-11 DIAGNOSIS — F17200 Nicotine dependence, unspecified, uncomplicated: Secondary | ICD-10-CM

## 2012-08-11 DIAGNOSIS — K219 Gastro-esophageal reflux disease without esophagitis: Secondary | ICD-10-CM | POA: Diagnosis present

## 2012-08-11 DIAGNOSIS — N3020 Other chronic cystitis without hematuria: Secondary | ICD-10-CM

## 2012-08-11 DIAGNOSIS — J309 Allergic rhinitis, unspecified: Secondary | ICD-10-CM

## 2012-08-11 DIAGNOSIS — F3289 Other specified depressive episodes: Secondary | ICD-10-CM

## 2012-08-11 DIAGNOSIS — M545 Low back pain, unspecified: Secondary | ICD-10-CM

## 2012-08-11 DIAGNOSIS — I2510 Atherosclerotic heart disease of native coronary artery without angina pectoris: Secondary | ICD-10-CM | POA: Diagnosis present

## 2012-08-11 DIAGNOSIS — R0989 Other specified symptoms and signs involving the circulatory and respiratory systems: Secondary | ICD-10-CM

## 2012-08-11 DIAGNOSIS — M129 Arthropathy, unspecified: Secondary | ICD-10-CM

## 2012-08-11 DIAGNOSIS — E871 Hypo-osmolality and hyponatremia: Secondary | ICD-10-CM

## 2012-08-11 DIAGNOSIS — F3130 Bipolar disorder, current episode depressed, mild or moderate severity, unspecified: Secondary | ICD-10-CM | POA: Diagnosis present

## 2012-08-11 DIAGNOSIS — R21 Rash and other nonspecific skin eruption: Secondary | ICD-10-CM

## 2012-08-11 DIAGNOSIS — M5412 Radiculopathy, cervical region: Secondary | ICD-10-CM

## 2012-08-11 DIAGNOSIS — J45901 Unspecified asthma with (acute) exacerbation: Secondary | ICD-10-CM

## 2012-08-11 DIAGNOSIS — N393 Stress incontinence (female) (male): Secondary | ICD-10-CM

## 2012-08-11 DIAGNOSIS — L678 Other hair color and hair shaft abnormalities: Secondary | ICD-10-CM

## 2012-08-11 DIAGNOSIS — L97409 Non-pressure chronic ulcer of unspecified heel and midfoot with unspecified severity: Secondary | ICD-10-CM

## 2012-08-11 DIAGNOSIS — R110 Nausea: Secondary | ICD-10-CM

## 2012-08-11 DIAGNOSIS — M79609 Pain in unspecified limb: Secondary | ICD-10-CM

## 2012-08-11 DIAGNOSIS — J329 Chronic sinusitis, unspecified: Secondary | ICD-10-CM

## 2012-08-11 DIAGNOSIS — G8929 Other chronic pain: Secondary | ICD-10-CM | POA: Diagnosis present

## 2012-08-11 DIAGNOSIS — N3941 Urge incontinence: Secondary | ICD-10-CM

## 2012-08-11 DIAGNOSIS — R197 Diarrhea, unspecified: Secondary | ICD-10-CM

## 2012-08-11 DIAGNOSIS — L259 Unspecified contact dermatitis, unspecified cause: Secondary | ICD-10-CM

## 2012-08-11 DIAGNOSIS — J312 Chronic pharyngitis: Secondary | ICD-10-CM

## 2012-08-11 DIAGNOSIS — K5730 Diverticulosis of large intestine without perforation or abscess without bleeding: Secondary | ICD-10-CM | POA: Diagnosis present

## 2012-08-11 DIAGNOSIS — R569 Unspecified convulsions: Secondary | ICD-10-CM

## 2012-08-11 DIAGNOSIS — B351 Tinea unguium: Secondary | ICD-10-CM

## 2012-08-11 DIAGNOSIS — N63 Unspecified lump in unspecified breast: Secondary | ICD-10-CM

## 2012-08-11 DIAGNOSIS — D126 Benign neoplasm of colon, unspecified: Secondary | ICD-10-CM

## 2012-08-11 DIAGNOSIS — R0609 Other forms of dyspnea: Secondary | ICD-10-CM

## 2012-08-11 MED ORDER — INFLUENZA VIRUS VACC SPLIT PF IM SUSP
0.5000 mL | INTRAMUSCULAR | Status: AC
  Administered 2012-08-12: 0.5 mL via INTRAMUSCULAR

## 2012-08-11 NOTE — ED Notes (Signed)
Pt lying in bed, watching tv, sitter remains at bedside,

## 2012-08-11 NOTE — ED Notes (Signed)
[facility] contacted for transport to [facility],

## 2012-08-11 NOTE — ED Notes (Signed)
Patient resting with eyes closed at present time. No distress noted.

## 2012-08-11 NOTE — ED Notes (Signed)
Tommy with ACT team speaking with patient.

## 2012-08-11 NOTE — Tx Team (Signed)
Initial Interdisciplinary Treatment Plan  PATIENT STRENGTHS: (choose at least two) Ability for insight Average or above average intelligence Capable of independent living Communication skills General fund of knowledge Motivation for treatment/growth Religious Affiliation Special hobby/interest Supportive family/friends  PATIENT STRESSORS: Legal issue Marital or family conflict Medication change or noncompliance   PROBLEM LIST: Problem List/Patient Goals Date to be addressed Date deferred Reason deferred Estimated date of resolution  "To receive medical and emotional care I need" 08/11/12     "Improve diet" 08/11/12     Depression 08/11/12                                          DISCHARGE CRITERIA:  Ability to meet basic life and health needs Adequate post-discharge living arrangements Improved stabilization in mood, thinking, and/or behavior Medical problems require only outpatient monitoring Motivation to continue treatment in a less acute level of care Need for constant or close observation no longer present Safe-care adequate arrangements made Verbal commitment to aftercare and medication compliance  PRELIMINARY DISCHARGE PLAN: Outpatient therapy Participate in family therapy Return to previous living arrangement  PATIENT/FAMIILY INVOLVEMENT: This treatment plan has been presented to and reviewed with the patient, [name], and/or family member.  The patient and family have been given the opportunity to ask questions and make suggestions.  Fransico Michael [facility] 08/11/2012, 8:43 PM

## 2012-08-11 NOTE — ED Notes (Signed)
Report given to [facility] at [facility],

## 2012-08-11 NOTE — ED Notes (Signed)
[facility] here for transport,

## 2012-08-11 NOTE — ED Provider Notes (Signed)
Patient accepted at behavioral health hospital by Dr. Malachy Moan.  She is in no distress her vital signs are stable.  BP 109/40  Pulse 65  Temp 98.5 F (36.9 C) (Oral)  Resp 18  SpO2 97%   Glynn Octave, MD 08/11/12 [phone]

## 2012-08-11 NOTE — ED Notes (Signed)
Pt speaking on phone with daughter Jory Ee, [phone], Tommy [facility] speaking with pt,

## 2012-08-11 NOTE — [facility] Counselor (Signed)
[name], assessment counselor at [facility], submitted Pt for admission to [facility]. Gave clinical report to [name], NP who accepted Pt to the services of Dr. Judie Petit. [name]. Consulted with Adult Unit staff who assigned Pt to room 404-2.  Harlin Rain [name], LPC, NCC

## 2012-08-11 NOTE — ED Notes (Signed)
Pt daughter Fraser Din contacted and notified of pt transfer to [facility] per pt request.
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
## 2012-08-11 NOTE — [facility] Counselor (Signed)
Patient has been accepted as an involuntary admission. She is to Dr Judie Petit. Akintayo. She will be transported by [facility]. Presert completed with 1 day authorization. Review is 08/12/2012. Dr Manus Gunning in agreement with transfer

## 2012-08-12 ENCOUNTER — Encounter (HOSPITAL_COMMUNITY): Payer: Self-pay | Admitting: Psychiatry

## 2012-08-12 LAB — COMPREHENSIVE METABOLIC PANEL
Alkaline Phosphatase: 81 U/L (ref 39–117)
BUN: 14 mg/dL (ref 6–23)
Calcium: 10 mg/dL (ref 8.4–10.5)
GFR calc Af Amer: >90 mL/min (ref >90)
Glucose, Bld: 109 mg/dL — ABNORMAL HIGH (ref 70–99)
Total Protein: 7.6 g/dL (ref 6.0–8.3)

## 2012-08-12 LAB — HEPATIC FUNCTION PANEL
ALT: 14 U/L (ref 0–35)
AST: 24 U/L (ref 0–37)
Albumin: 3.6 g/dL (ref 3.5–5.2)
Alkaline Phosphatase: 80 U/L (ref 39–117)
Total Bilirubin: 0.4 mg/dL (ref 0.3–1.2)

## 2012-08-12 MED ORDER — CARBAMAZEPINE 200 MG PO TABS
200.0000 mg | ORAL_TABLET | Freq: Three times a day (TID) | ORAL | Status: DC
  Administered 2012-08-12 – 2012-08-16 (×13): 200 mg via ORAL
  Filled 2012-08-12 (×25): qty 1

## 2012-08-12 MED ORDER — BENAZEPRIL HCL 20 MG PO TABS
20.0000 mg | ORAL_TABLET | Freq: Every day | ORAL | Status: DC
  Administered 2012-08-12 – 2012-08-16 (×5): 20 mg via ORAL
  Filled 2012-08-12 (×3): qty 1
  Filled 2012-08-12: qty 2
  Filled 2012-08-12 (×5): qty 1

## 2012-08-12 MED ORDER — NICOTINE 21 MG/24HR TD PT24
21.0000 mg | MEDICATED_PATCH | Freq: Every day | TRANSDERMAL | Status: DC
  Administered 2012-08-12 – 2012-08-16 (×5): 21 mg via TRANSDERMAL
  Filled 2012-08-12 (×8): qty 1

## 2012-08-12 MED ORDER — INSULIN ASPART 100 UNIT/ML ~~LOC~~ SOLN: 0.0000 [IU] | Freq: Every day | SUBCUTANEOUS | Status: DC

## 2012-08-12 MED ORDER — NITROGLYCERIN 0.4 MG SL SUBL: 0.4000 mg | SUBLINGUAL_TABLET | SUBLINGUAL | Status: DC | PRN

## 2012-08-12 MED ORDER — DULOXETINE HCL 60 MG PO CPEP
60.0000 mg | ORAL_CAPSULE | Freq: Every day | ORAL | Status: DC
  Administered 2012-08-12 – 2012-08-16 (×5): 60 mg via ORAL
  Filled 2012-08-12 (×9): qty 1

## 2012-08-12 MED ORDER — NEBIVOLOL HCL 10 MG PO TABS
10.0000 mg | ORAL_TABLET | Freq: Every day | ORAL | Status: DC
  Administered 2012-08-12 – 2012-08-16 (×5): 10 mg via ORAL
  Filled 2012-08-12 (×8): qty 1

## 2012-08-12 MED ORDER — SIMVASTATIN 5 MG PO TABS
5.0000 mg | ORAL_TABLET | Freq: Every day | ORAL | Status: DC
  Administered 2012-08-12 – 2012-08-15 (×4): 5 mg via ORAL
  Filled 2012-08-12 (×6): qty 1

## 2012-08-12 MED ORDER — ASPIRIN 81 MG PO TBEC: 81.0000 mg | DELAYED_RELEASE_TABLET | Freq: Every day | ORAL | Status: DC

## 2012-08-12 MED ORDER — POTASSIUM CHLORIDE CRYS ER 20 MEQ PO TBCR
20.0000 meq | EXTENDED_RELEASE_TABLET | Freq: Every day | ORAL | Status: AC
  Administered 2012-08-12 – 2012-08-16 (×5): 20 meq via ORAL
  Filled 2012-08-12 (×6): qty 1

## 2012-08-12 MED ORDER — PANTOPRAZOLE SODIUM 40 MG PO TBEC
40.0000 mg | DELAYED_RELEASE_TABLET | Freq: Two times a day (BID) | ORAL | Status: DC
  Administered 2012-08-12 – 2012-08-16 (×9): 40 mg via ORAL
  Filled 2012-08-12 (×18): qty 1

## 2012-08-12 MED ORDER — IPRATROPIUM-ALBUTEROL 18-103 MCG/ACT IN AERO
2.0000 | INHALATION_SPRAY | Freq: Four times a day (QID) | RESPIRATORY_TRACT | Status: DC | PRN
  Filled 2012-08-12: qty 14.7

## 2012-08-12 MED ORDER — AMLODIPINE BESYLATE 10 MG PO TABS
10.0000 mg | ORAL_TABLET | Freq: Every day | ORAL | Status: DC
  Administered 2012-08-12 – 2012-08-16 (×5): 10 mg via ORAL
  Filled 2012-08-12: qty 1
  Filled 2012-08-12: qty 2
  Filled 2012-08-12 (×7): qty 1

## 2012-08-12 MED ORDER — INSULIN ASPART 100 UNIT/ML ~~LOC~~ SOLN: 0.0000 [IU] | Freq: Three times a day (TID) | SUBCUTANEOUS | Status: DC

## 2012-08-12 MED ORDER — ALUM & MAG HYDROXIDE-SIMETH 200-200-20 MG/5ML PO SUSP: 30.0000 mL | ORAL | Status: DC | PRN

## 2012-08-12 MED ORDER — POTASSIUM CHLORIDE 20 MEQ PO PACK
20.0000 meq | PACK | Freq: Every day | ORAL | Status: DC
  Filled 2012-08-12 (×2): qty 1

## 2012-08-12 MED ORDER — GLIMEPIRIDE 1 MG PO TABS
1.0000 mg | ORAL_TABLET | Freq: Every day | ORAL | Status: DC
  Filled 2012-08-12: qty 1

## 2012-08-12 MED ORDER — ACETAMINOPHEN 325 MG PO TABS
650.0000 mg | ORAL_TABLET | Freq: Four times a day (QID) | ORAL | Status: DC | PRN
  Administered 2012-08-12 – 2012-08-16 (×9): 650 mg via ORAL

## 2012-08-12 MED ORDER — PROMETHAZINE HCL 25 MG RE SUPP
25.0000 mg | Freq: Four times a day (QID) | RECTAL | Status: DC | PRN
  Administered 2012-08-12 – 2012-08-16 (×5): 25 mg via RECTAL
  Filled 2012-08-12 (×5): qty 1

## 2012-08-12 MED ORDER — MAGNESIUM HYDROXIDE 400 MG/5ML PO SUSP: 30.0000 mL | Freq: Every day | ORAL | Status: DC | PRN

## 2012-08-12 MED ORDER — FLUTICASONE PROPIONATE 50 MCG/ACT NA SUSP
1.0000 | Freq: Every day | NASAL | Status: DC
  Administered 2012-08-12 – 2012-08-16 (×5): 1 via NASAL
  Filled 2012-08-12: qty 16

## 2012-08-12 MED ORDER — ASPIRIN EC 81 MG PO TBEC
81.0000 mg | DELAYED_RELEASE_TABLET | Freq: Every day | ORAL | Status: DC
  Administered 2012-08-12 – 2012-08-16 (×5): 81 mg via ORAL
  Filled 2012-08-12 (×9): qty 1

## 2012-08-12 NOTE — Progress Notes (Signed)
Patient ID: JEWELIANA DUDGEON, female   DOB: [birth year], 66 y.o.   MRN: [id number]   D:  Writer found it difficult follow the Clinical research associate. However writer asked the pt about circumstances surrounding her adm. Pt stated, "I got sick and called my daughter".  Stated she wants to get her medicines regulated because she wasn't getting the help she needed.  Writer asked what pt meant by "help". Pt stated running out of med and not able to get the things she needed. Stated her husband left her apprx a month ago. "He left with me with nothing. All I had to eat was the food that was left in the house." However, pt stated that due to "her stomach not emptying" her diet consist of beef broth and crackers. Pt had to be redirected several times during the adm process. Denied SI, HI and A/V.
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
## 2012-08-12 NOTE — Progress Notes (Signed)
Nutrition Brief Note  Patient identified on the Malnutrition Screening Tool (MST) Report  Body mass index is 29.98 kg/(m^2). Pt meets criteria for overweight based on current BMI.  Wt Readings from Last 10 Encounters:  08/11/12 176 lb (79.833 kg)  04/27/12 192 lb 2 oz (87.147 kg)  03/11/12 186 lb (84.369 kg)  03/03/12 189 lb (85.73 kg)  05/23/11 186 lb (84.369 kg)  05/08/11 186 lb (84.369 kg)  11/20/10 191 lb 6.4 oz (86.818 kg)  08/30/10 192 lb 4 oz (87.204 kg)  04/03/10 185 lb (83.915 kg)  03/02/10 182 lb 8 oz (82.781 kg)     Current diet order is CHO Modified Med, patient is consuming approximately 50-75% of meals at this time. Labs and medications reviewed.   Pt states she has lost 10-20 lbs over the past year.   Pt states that she had a gastric emptying study done recently with Dr. Arlyce Dice and was put on a liquid diet.  Per chart review this was completed in 2010 with recent repeat to this writer's knowledge.  Pt states she was placed on a diet after this study due to delayed gastric emptying.  She states she was to advance through 3 stages but has been unable to advance past the first stage of clear liquids due to decreased assess to food and tolerance.    She is pleased she has been tolerating solid meals thus far.  Pt is tangential during meeting often moving from topic at hand but always returns to discussing nutrition. Pt is very focused on the results of her gastric emptying study and the fact that she has not been able to eat anything but jello and beef broth for several days because she is still in Stage 1 of diet advancement.  Pt is currently on a Regular diet and eating at meal times. No nutrition interventions warranted at this time. If nutrition issues arise, please consult RD.   Loyce Dys, MS RD LDN Clinical Inpatient Dietitian Pager: [phone] Weekend/After hours pager: [phone]

## 2012-08-12 NOTE — Progress Notes (Signed)
Patient ID: [name], female   DOB: 07-11-1946, 66 y.o.   MRN: [id number] D: pt. Reports depression at "6-7" of 10, "its better". Pt. Reports she came to [facility] because "being older, it's difficult, getting medicine taking care of things at home" "my husband left, it's hard to take care of things, he walked out 2 months ago, just left" "he left me a note" "he has PTSD, he's a veteran"  Pt. Reports being here has been helpful "realizing it could happen to anybody, but you can still be useful, you not alone."

## 2012-08-12 NOTE — Progress Notes (Signed)
Psychoeducational Group Note  Date:  08/12/2012 Time:  [id number]  Group Topic/Focus:  Wrap-Up Group:   The focus of this group is to help patients review their daily goal of treatment and discuss progress on daily workbooks.  Participation Level: Did Not Attend  Participation Quality:  Not Applicable  Affect:  Not Applicable  Cognitive:  Not Applicable  Insight:  Not Applicable  Engagement in Group: Not Applicable  Additional Comments:  Pt was invited to attend the evening session, but reported that she was not feeling well enough to attend.  Christ Kick 08/12/2012, 9:56 PM

## 2012-08-12 NOTE — Progress Notes (Signed)
D: Patient denies SI/HI and auditory and visual hallucinations. The patient has a depressed mood and affect. The patient is interacting appropriately within the milieu and is attending groups. The patient states that she needs "pain medication" for her chronic pain (states that sometimes it is "neck or back" and, at others times, is "just everywhere").  A: Patient given emotional support from RN. Patient encouraged to come to staff with concerns and/or questions. Patient's medication routine continued. Patient's orders and plan of care reviewed. Patient referred to MD for pain medication questions.  R: Patient remains cooperative. Will continue to monitor patient q15 minutes for safety.

## 2012-08-12 NOTE — Progress Notes (Signed)
[facility] LCSW Group Therapy  08/12/2012 4:34 PM  Type of Therapy:  [facility]  Participation Level:  Minimal  Participation Quality:  Attentive  Affect:  Appropriate  Cognitive:  Appropriate  Insight:  Engaged  Engagement in Therapy:  Engaged  Modes of Intervention:  Education and Socialization  Summary of Progress/Problems:Thi listened atentatively to both the presentation and the guitar performance.  She asked no questions, but later said how much she enjoyed both. [name] B 08/12/2012, 4:34 PM
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
## 2012-08-12 NOTE — Progress Notes (Signed)
[facility] LCSW Group Therapy  08/12/2012 2:10 PM  Type of Therapy:  [facility]  Participation Level:  Active  Participation Quality:  Attentive  Affect:  Appropriate  Cognitive:  Alert  Insight:  Engaged  Engagement in Therapy:  Engaged  Modes of Intervention:  Education and Socialization  Summary of Progress/Problems: Siobhan was attentive thru both the presentation and the guitar performance.  [name] B 08/12/2012, 2:10 PM

## 2012-08-12 NOTE — Progress Notes (Signed)
Psychoeducational Group Note  Date:  08/12/2012 Time:  1100  Group Topic/Focus:  Crisis Planning:   The purpose of this group is to help patients create a crisis plan for use upon discharge or in the future, as needed.  Participation Level:  Active  Participation Quality:  Appropriate and Attentive  Affect:  Appropriate  Cognitive:  Alert and Appropriate  Insight:  Engaged  Engagement in Group:  Engaged  Additional Comments:  Pt. Engaged in group, participated in creating a crisis plan, and offered support to peers.   [name] [facility] 08/12/2012, 11:30 AM

## 2012-08-12 NOTE — Treatment Plan (Signed)
Interdisciplinary Treatment Plan Update (Adult)  Date: 08/12/2012  Time Reviewed: 10:02 AM   Progress in Treatment: Attending groups: Yes Participating in groups: Yes Taking medication as prescribed: Yes Tolerating medication: Yes   Family/Significant other contact made:  Not yet Patient understands diagnosis:  Yes  As evidenced by asking for help with being overwhelmed with emotions. Discussing patient identified problems/goals with staff:  Yes  See below Medical problems stabilized or resolved:  Yes Denies suicidal/homicidal ideation: Yes  In tx team Issues/concerns per patient self-inventory:  Not filled out Other:  New problem(s) identified: N/A  Reason for Continuation of Hospitalization: Depression Hallucinations Medication stabilization  Interventions implemented related to continuation of hospitalization: Restart home medications,  Encourage group attendance and participation,  CM to contact daughter for collateral information  Additional comments:  Estimated length of stay:3-5 days  Discharge Plan:unknown  New goal(s): N/A  Review of initial/current patient goals per problem list:   1.  Goal(s): Stabilize mood through the use of medications/therapeutic milieu  Met:  No  Target date:12/16  As evidenced by: Steward Drone will rate her depression and anxiety at a 3 or less on a 10 scale  2.  Goal (s): Eliminate psychosis  Met:  No  Target date:12/16  As evidenced [id number] report of decreased or eliminated paranoia/observation of such  3.  Goal(s): Determine dispositional plan post d/c  Met:  No  Target date:12/16  As evidenced JY:NWGN report/support of CM  4.  Goal(s):Identify outpt providers  Met:  No  Target date:12/16  As evidenced [id number] of appointment time and date  Attendees: Patient:     Family:     Physician:  Thedore Mins 08/12/2012 10:02 AM   Nursing:  Liborio Nixon  08/12/2012 10:02 AM   Clinical Social Worker:  Richelle Ito  08/12/2012 10:02 AM   Extender:  Verne Spurr PA 08/12/2012 10:02 AM   Other:     Other:     Other:     Other:      Scribe for Treatment Team:   [name] B, 08/12/2012 10:02 AM

## 2012-08-13 LAB — URINE CULTURE: Colony Count: 30000

## 2012-08-13 MED ORDER — CLONAZEPAM 0.5 MG PO TABS
0.5000 mg | ORAL_TABLET | Freq: Two times a day (BID) | ORAL | Status: DC | PRN
  Administered 2012-08-13 – 2012-08-16 (×5): 0.5 mg via ORAL
  Filled 2012-08-13 (×5): qty 1
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
## 2012-08-13 NOTE — Progress Notes (Signed)
Patient ID: [name], female   DOB: [birth year], 66 y.o.   MRN: [id number] D: Patient lying in bed with eyes closed. Respirations even and non-labored. A: Staff will monitor on q 15 minute checks, follow treatment plan, and give meds as ordered. R: No response from patient due to sleeping at this time.

## 2012-08-13 NOTE — Progress Notes (Signed)
[facility] LCSW Group Therapy  08/13/2012 3:38 PM  Type of Therapy:  Group Therapy  Participation Level:  Active  Participation Quality:  Appropriate  Affect:  Appropriate  Cognitive:  Alert  Insight:  Engaged  Engagement in Therapy:  Engaged  Modes of Intervention:  Discussion, Exploration and Socialization  Summary of Progress/Problems:Today's group focused on balance in life, and how to reach it.  Danyal was engaged and enjoyed the discussion.  She shared that she is imbalanced when looking out and doing for others, and is balanced when remembering to take care of self.  She furthermore talked about the spirits of her 2 deceased sons and how when she focuses she gains strength from them.  [name] B 08/13/2012, 3:38 PM

## 2012-08-13 NOTE — Progress Notes (Signed)
D: Patient denies SI/HI and auditory and visual hallucinations. The patient has a depressed mood and affect. The patient states that she "slept well" and that she has a good appetite and energy level. The patient is attending groups and interacting appropriately within the milieu. The patient states that she feels that she is "getting a little better each day."  A: Patient given emotional support from RN. Patient encouraged to come to staff with concerns and/or questions. Patient's medication routine continued. Patient's orders and plan of care reviewed.  R: Patient remains appropriate and cooperative. Will continue to monitor patient q15 minutes for safety.

## 2012-08-13 NOTE — Progress Notes (Signed)
[facility] Group Notes:  (Counselor/Nursing/MHT/Case Management/Adjunct)  08/13/2012 4:36 PM  Type of Therapy:  Psychoeducational Skills  Participation Level:  Minimal  Participation Quality:  Attentive  Affect:  Gaurded  Cognitive:  Oriented  Insight:  Limited  Engagement in Group:  Limited  Engagement in Therapy:  n/a  Modes of Intervention:  Activity, Discussion, Education, Rapport Building, Socialization and Support  Summary of Progress/Problems: [name] attended psychoeducational group on labels. [name] viewed a video depicting different ways labels are used to change perceptions. [name] was quiet and appeared worried (brow furrowed, lips pursued) while group discussed what labels are, how we use them, how they effect they way we think about and perceive the world, and listed positive and negative labels they have used or been called. [name] participated in sharing acivity listing a goal for the day, something unique about herself, and a favorite holiday song. [name] participated in an art activity writing something unique on a snowflake and hanging it on the hall "tree".   [name] 08/13/2012, 4:36 PM
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
## 2012-08-13 NOTE — Progress Notes (Signed)
Patient ID: [name], female   DOB: [birth year], 66 y.o.   MRN: [id number]  D: Pt denies SI/HI/AVH. Pt is pleasant and cooperative. Pt complains of low back pain 7 out of 10. Pt states anxiety level is elevated. Pt worried about what is to happen to her, when she leaves, states she received papers to attend court on the 19th and she worried about that situation.  A: Pt was offered support and encouragement. Pt was given scheduled medications. Pt was encourage to attend groups. Q 15 minute checks were done for safety. Pt given PRN tylenol for low back pain. Pt given 0.5 mg klonopin for anxiety  R:Pt attends groups and interacts well with peers and staff. Pt is taking medication.Pt receptive to treatment and safety maintained on unit. Pt states back pain at 4 after taking tylenol.

## 2012-08-13 NOTE — Progress Notes (Signed)
Psychoeducational Group Note  Date:  08/13/2012 Time:  0930  Group Topic/Focus:  Self Esteem Action Plan:   The focus of this group is to help patients create a plan to continue to build self-esteem after discharge.  Participation Level:  Active  Participation Quality:  Appropriate  Affect:  Appropriate  Cognitive:  Appropriate  Insight:  Engaged  Engagement in Group:  Engaged  Additional Comments:  Pt was able to share positively with the group.  Marieli Rudy E 08/13/2012, 2:20 PM

## 2012-08-13 NOTE — Clinical Social Work Note (Signed)
Spoke with Sharlene's daughter who states that her mother sounds the best she has in 9 months.  Plans to arrive on Friday after work [around 11PM] and work to get [facility] belongings together so that she can take her mother with her on Sat or Sun to [place].  Told her I would pass this on to Dr and tx team.

## 2012-08-14 NOTE — Progress Notes (Signed)
D: Patient denies SI/HI and auditory and visual hallucinations. The patient has a depressed mood and affect. The patient states that she "slept well" and that she has a good appetite and energy level. The patient is attending groups and interacting appropriately within the milieu. The patient states that she is "feeling good today" and that she is "glad to be here."  A: Patient given emotional support from RN. Patient encouraged to come to staff with concerns and/or questions. Patient's medication routine continued. Patient's orders and plan of care reviewed.   R: Patient remains appropriate and cooperative. Will continue to monitor patient q15 minutes for safety.

## 2012-08-14 NOTE — Progress Notes (Signed)
[facility] LCSW Group Therapy  08/14/2012 2:55 PM  Type of Therapy:  Group Therapy  Participation Level:  Active  Participation Quality:  Attentive  Affect:  Appropriate  Cognitive:  Appropriate  Insight:  Engaged  Engagement in Therapy:  Engaged  Modes of Intervention:  Discussion  Summary of Progress/Problems:Today's group focused on patients' experience during the holiday season following 2 group activities.  [name] came in after the activities were completed.  She shared that this is a holiday of transition as she and her husband have split up, and she is going to stay with her daughter.  While this is stressful because of worrying about him and missing him, and also moves are difficult, she is hopeful that all work out in the end. [name] B 08/14/2012, 2:55 PM

## 2012-08-14 NOTE — Progress Notes (Signed)
Patient ID: [name], female   DOB: 07-Aug-1946, 66 y.o.   MRN: [id number] D. The patient is pleasant and interacting in the milieu. Voiced concern regarding her medication. Wanted to know why she wasn't feeling better. Had some somatic complaints. Happy that she spoke with her daughter on the phone.  A. Met with patient to review medications. Encouraged to attend evening group. R. The patient attended and actively participated in evening wrap up group. Expressed understanding of her medication. Denied any suicidal ideation. No paranoid delusional thinking evident at this time.

## 2012-08-14 NOTE — Progress Notes (Signed)
Patient ID: [name], female   DOB: 1946/01/15, 66 y.o.   MRN: [id number] [facility] MD Progress Note  08/14/2012 11:04 AM [name]  MRN:  [id number] Subjective: " I am feeling much better, just a little anxious today". Diagnosis:  Bipolar disorder with current episode depressed   ADL's:  Intact  Sleep: good  Appetite:  good  Suicidal Ideation: denies Plan:  denies Intent:  denies Means:  denies Homicidal Ideation: denies Plan:  denies Intent:  de Means:  denies AEB (as evidenced by): Patient reports improving depressive symptoms, sleeping better and feeling more motivated. She reports that she was happy to see her daughter yesterday but feels a little anxious today. She is compliant with her medications and did not report any adverse reactions to it.  Psychiatric Specialty Exam: Review of Systems  Constitutional: Negative.   HENT: Negative.   Respiratory: Negative.   Cardiovascular: Negative.   Gastrointestinal: Negative.   Genitourinary: Negative.   Musculoskeletal: Negative.   Skin: Negative.   Endo/Heme/Allergies: Negative.   Psychiatric/Behavioral: The patient is nervous/anxious.     Blood pressure 133/84, pulse 93, temperature 98.7 F (37.1 C), temperature source Oral, resp. rate 18, height 5' 4.25" (1.632 m), weight 79.833 kg (176 lb), SpO2 97.00%.Body mass index is 29.98 kg/(m^2).  General Appearance: Fairly Groomed  Patent attorney::  Good  Speech:  Clear and Coherent  Volume:  Normal  Mood:  neutral  Affect:  anxious  Thought Process:  Goal Directed and Linear  Orientation:  Full (Time, Place, and Person)  Thought Content:  Goal directed, reality based  Suicidal Thoughts:  No  Homicidal Thoughts:  No  Memory:  Immediate;   Good Recent;   Good Remote;   Fair  Judgement:  Impaired  Insight:  Shallow  Psychomotor Activity:  Decreased  Concentration:  Fair  Recall:  Fair  Akathisia:  No  Handed:  Ambidextrous  AIMS (if indicated):     Assets:   Communication Skills Desire for Improvement  Sleep:  Number of Hours: 6.25    Current Medications: Current Facility-Administered Medications  Medication Dose Route Frequency Provider Last Rate Last Dose  . acetaminophen (TYLENOL) tablet 650 mg  650 mg Oral Q6H PRN Kerry Hough, PA   650 mg at 08/13/12 2001  . albuterol-ipratropium (COMBIVENT) inhaler 2 puff  2 puff Inhalation Q6H PRN Kerry Hough, PA      . alum & mag hydroxide-simeth (MAALOX/MYLANTA) 200-200-20 MG/5ML suspension 30 mL  30 mL Oral Q4H PRN Kerry Hough, PA      . amLODipine (NORVASC) tablet 10 mg  10 mg Oral Daily Kerry Hough, PA   10 mg at 08/14/12 0740  . aspirin EC tablet 81 mg  81 mg Oral Daily Syaire Saber   81 mg at 08/14/12 0738  . benazepril (LOTENSIN) tablet 20 mg  20 mg Oral Daily Kerry Hough, PA   20 mg at 08/14/12 0738  . carbamazepine (TEGRETOL) tablet 200 mg  200 mg Oral TID Kerry Hough, PA   200 mg at 08/14/12 2130  . clonazePAM (KLONOPIN) tablet 0.5 mg  0.5 mg Oral BID PRN Verne Spurr, PA-C   0.5 mg at 08/13/12 2138  . DULoxetine (CYMBALTA) DR capsule 60 mg  60 mg Oral Daily Kerry Hough, PA   60 mg at 08/14/12 0738  . fluticasone (FLONASE) 50 MCG/ACT nasal spray 1 spray  1 spray Each Nare Daily Kerry Hough, PA   1 spray  at 08/14/12 0744  . magnesium hydroxide (MILK OF MAGNESIA) suspension 30 mL  30 mL Oral Daily PRN Kerry Hough, PA      . nebivolol (BYSTOLIC) tablet 10 mg  10 mg Oral Daily Kerry Hough, PA   10 mg at 08/14/12 0740  . nicotine (NICODERM CQ - dosed in mg/24 hours) patch 21 mg  21 mg Transdermal Q0600 Kerry Hough, PA   21 mg at 08/14/12 0740  . nitroGLYCERIN (NITROSTAT) SL tablet 0.4 mg  0.4 mg Sublingual Q5 min PRN Kerry Hough, PA      . pantoprazole (PROTONIX) EC tablet 40 mg  40 mg Oral BID Kerry Hough, PA   40 mg at 08/14/12 0738  . potassium chloride SA (K-DUR,KLOR-CON) CR tablet 20 mEq  20 mEq Oral Daily Keeven Matty   20 mEq at 08/14/12  0738  . promethazine (PHENERGAN) suppository 25 mg  25 mg Rectal Q6H PRN Kerry Hough, PA   25 mg at 08/13/12 1016  . simvastatin (ZOCOR) tablet 5 mg  5 mg Oral q1800 Kerry Hough, PA   5 mg at 08/13/12 1701    Lab Results:  No results found for this or any previous visit (from the past 48 hour(s)).  Physical Findings: AIMS: Facial and Oral Movements Muscles of Facial Expression: None, normal Lips and Perioral Area: None, normal Jaw: None, normal Tongue: None, normal,Extremity Movements Upper (arms, wrists, hands, fingers): None, normal Lower (legs, knees, ankles, toes): None, normal, Trunk Movements Neck, shoulders, hips: None, normal, Overall Severity Severity of abnormal movements (highest score from questions above): None, normal Incapacitation due to abnormal movements: None, normal Patient's awareness of abnormal movements (rate only patient's report): No Awareness, Dental Status Current problems with teeth and/or dentures?: No Does patient usually wear dentures?: No  CIWA:    COWS:     Treatment Plan Summary: Daily contact with patient to assess and evaluate symptoms and progress in treatment Medication management  Plan: 1. I will continue current medication regimen. 2. Will encourage patient to attend group therapy and other millieu. 3. Possible discharge tomorrow if she continues to show improvement.  Medical Decision Making Problem Points:  Established problem, stable/improving (1), Review of last therapy session (1), Review of psycho-social stressors (1) and Self-limited or minor (1) Data Points:  Order Aims Assessment (2) Review or order clinical lab tests (1) Review of medication regiment & side effects (2)  I certify that inpatient services furnished can reasonably be expected to improve the patient's condition.   Kiah Keay,MD 08/14/2012, 11:04 AM

## 2012-08-15 DIAGNOSIS — F3130 Bipolar disorder, current episode depressed, mild or moderate severity, unspecified: Principal | ICD-10-CM

## 2012-08-15 LAB — BASIC METABOLIC PANEL
Chloride: 102 mEq/L (ref 96–112)
Creatinine, Ser: 0.69 mg/dL (ref 0.50–1.10)
GFR calc Af Amer: >90 mL/min (ref >90)
GFR calc non Af Amer: >90 mL/min (ref >90)

## 2012-08-15 MED ORDER — TRIAMCINOLONE ACETONIDE(NASAL) 55 MCG/ACT NA INHA: 2.0000 | Freq: Every day | NASAL | Status: DC

## 2012-08-15 MED ORDER — PANTOPRAZOLE SODIUM 40 MG PO TBEC: 40.0000 mg | DELAYED_RELEASE_TABLET | Freq: Two times a day (BID) | ORAL | Status: DC

## 2012-08-15 MED ORDER — CLONAZEPAM 1 MG PO TABS: 1.0000 mg | ORAL_TABLET | Freq: Two times a day (BID) | ORAL | Status: DC | PRN

## 2012-08-15 MED ORDER — IPRATROPIUM-ALBUTEROL 18-103 MCG/ACT IN AERO: 2.0000 | INHALATION_SPRAY | Freq: Four times a day (QID) | RESPIRATORY_TRACT | Status: DC | PRN

## 2012-08-15 MED ORDER — NEBIVOLOL HCL 10 MG PO TABS: 10.0000 mg | ORAL_TABLET | Freq: Every day | ORAL | Status: DC

## 2012-08-15 MED ORDER — BENAZEPRIL HCL 10 MG PO TABS
20.0000 mg | ORAL_TABLET | Freq: Every day | ORAL | Status: DC
  Filled 2012-08-15: qty 6

## 2012-08-15 MED ORDER — SIMVASTATIN 40 MG PO TABS
40.0000 mg | ORAL_TABLET | Freq: Every day | ORAL | Status: DC
  Filled 2012-08-15: qty 3

## 2012-08-15 MED ORDER — PRAVASTATIN SODIUM 80 MG PO TABS: 80.0000 mg | ORAL_TABLET | Freq: Every evening | ORAL | Status: DC

## 2012-08-15 MED ORDER — DULOXETINE HCL 60 MG PO CPEP: 60.0000 mg | ORAL_CAPSULE | Freq: Every day | ORAL | Status: DC

## 2012-08-15 MED ORDER — BENAZEPRIL HCL 20 MG PO TABS: 20.0000 mg | ORAL_TABLET | Freq: Every day | ORAL | Status: DC

## 2012-08-15 MED ORDER — OMEGA-3 FATTY ACIDS 1000 MG PO CAPS: 2.0000 g | ORAL_CAPSULE | Freq: Every day | ORAL | Status: AC

## 2012-08-15 MED ORDER — OMEGA-3-ACID ETHYL ESTERS 1 G PO CAPS
2.0000 g | ORAL_CAPSULE | Freq: Every day | ORAL | Status: DC
  Filled 2012-08-15: qty 6

## 2012-08-15 MED ORDER — AMLODIPINE BESYLATE 10 MG PO TABS: 10.0000 mg | ORAL_TABLET | Freq: Every day | ORAL | Status: DC

## 2012-08-15 MED ORDER — CARBAMAZEPINE 200 MG PO TABS: 200.0000 mg | ORAL_TABLET | Freq: Three times a day (TID) | ORAL | Status: DC

## 2012-08-15 MED ORDER — CARBAMAZEPINE 200 MG PO TABS: ORAL_TABLET | ORAL | Status: DC

## 2012-08-15 MED ORDER — ASPIRIN 81 MG PO TBEC: 81.0000 mg | DELAYED_RELEASE_TABLET | Freq: Every day | ORAL | Status: DC

## 2012-08-15 NOTE — Progress Notes (Addendum)
Patient ID: [name], female   DOB: 10-19-1945, 66 y.o.   MRN: [id number] [facility] MD Progress Note  08/15/2012 4:50 PM [name]  MRN:  [id number]   Subjective: reports feeling better overall and thinks she will be ready to leave soon. Reports being at her baseline now.   Dx:   Bipolar disorder with current episode depressed   ADL's:  Intact  Sleep: good  Appetite:  good  Suicidal Ideation: denies Plan:  denies Intent:  denies Means:  denies Homicidal Ideation: denies Plan:  denies Intent:  de Means:  denies   Psychiatric Specialty Exam: Review of Systems  Constitutional: Negative.   HENT: Negative.   Respiratory: Negative.   Cardiovascular: Negative.   Gastrointestinal: Negative.   Genitourinary: Negative.   Musculoskeletal: Negative.   Skin: Negative.   Endo/Heme/Allergies: Negative.   Psychiatric/Behavioral: The patient is nervous/anxious.     Blood pressure 118/71, pulse 78, temperature 98.2 F (36.8 C), temperature source Oral, resp. rate 16, height 5' 4.25" (1.632 m), weight 79.833 kg (176 lb), SpO2 97.00%.Body mass index is 29.98 kg/(m^2).  General Appearance: Fairly Groomed  Patent attorney::  Good  Speech:  Clear and Coherent  Volume:  Normal  Mood:  ok  Affect:  ristricted  Thought Process:  Goal Directed and Linear most of the time  Orientation:  Full (Time, Place, and Person)  Thought Content:  No AVH  Suicidal Thoughts:  No  Homicidal Thoughts:  No  Memory:  Immediate;   Good Recent;   Good Remote;   Fair  Judgement:  Impaired  Insight:  Shallow  Psychomotor Activity:  Decreased  Concentration:  Fair  Recall:  Fair  Akathisia:  No  Handed:  Ambidextrous  AIMS (if indicated):     Assets:  Communication Skills Desire for Improvement  Sleep:  Number of Hours: 5.5    Current Medications: Current Facility-Administered Medications  Medication Dose Route Frequency Provider Last Rate Last Dose  . acetaminophen (TYLENOL) tablet 650  mg  650 mg Oral Q6H PRN Kerry Hough, PA   650 mg at 08/15/12 1647  . albuterol-ipratropium (COMBIVENT) inhaler 2 puff  2 puff Inhalation Q6H PRN Kerry Hough, PA      . alum & mag hydroxide-simeth (MAALOX/MYLANTA) 200-200-20 MG/5ML suspension 30 mL  30 mL Oral Q4H PRN Kerry Hough, PA      . amLODipine (NORVASC) tablet 10 mg  10 mg Oral Daily Mojeed Akintayo   10 mg at 08/15/12 0746  . aspirin EC tablet 81 mg  81 mg Oral Daily Mojeed Akintayo   81 mg at 08/15/12 0745  . benazepril (LOTENSIN) tablet 20 mg  20 mg Oral Daily Mojeed Akintayo   20 mg at 08/15/12 0746  . carbamazepine (TEGRETOL) tablet 200 mg  200 mg Oral TID Mojeed Akintayo   200 mg at 08/15/12 1628  . clonazePAM (KLONOPIN) tablet 0.5 mg  0.5 mg Oral BID PRN Verne Spurr, PA-C   0.5 mg at 08/15/12 1647  . DULoxetine (CYMBALTA) DR capsule 60 mg  60 mg Oral Daily Mojeed Akintayo   60 mg at 08/15/12 0746  . fluticasone (FLONASE) 50 MCG/ACT nasal spray 1 spray  1 spray Each Nare Daily Kerry Hough, PA   1 spray at 08/15/12 0745  . magnesium hydroxide (MILK OF MAGNESIA) suspension 30 mL  30 mL Oral Daily PRN Kerry Hough, PA      . nebivolol (BYSTOLIC) tablet 10 mg  10 mg Oral Daily  Mojeed Akintayo   10 mg at 08/15/12 0746  . nicotine (NICODERM CQ - dosed in mg/24 hours) patch 21 mg  21 mg Transdermal Q0600 Kerry Hough, PA   21 mg at 08/15/12 0653  . nitroGLYCERIN (NITROSTAT) SL tablet 0.4 mg  0.4 mg Sublingual Q5 min PRN Kerry Hough, PA      . pantoprazole (PROTONIX) EC tablet 40 mg  40 mg Oral BID Mojeed Akintayo   40 mg at 08/15/12 1628  . potassium chloride SA (K-DUR,KLOR-CON) CR tablet 20 mEq  20 mEq Oral Daily Mojeed Akintayo   20 mEq at 08/15/12 0746  . promethazine (PHENERGAN) suppository 25 mg  25 mg Rectal Q6H PRN Kerry Hough, PA   25 mg at 08/14/12 1116  . simvastatin (ZOCOR) tablet 5 mg  5 mg Oral q1800 Kerry Hough, PA   5 mg at 08/15/12 1628    Lab Results:  Results for orders placed during  the hospital encounter of 08/11/12 (from the past 48 hour(s))  BASIC METABOLIC PANEL     Status: Normal   Collection Time   08/15/12  6:50 AM      Component Value Range Comment   Sodium 139  135 - 145 mEq/L    Potassium 3.6  3.5 - 5.1 mEq/L    Chloride 102  96 - 112 mEq/L    CO2 28  19 - 32 mEq/L    Glucose, Bld 87  70 - 99 mg/dL    BUN 8  6 - 23 mg/dL    Creatinine, Ser 0.27  0.50 - 1.10 mg/dL    Calcium 9.2  8.4 - 25.3 mg/dL    GFR calc non Af Amer >90  >90 mL/min    GFR calc Af Amer >90  >90 mL/min     Physical Findings: AIMS: Facial and Oral Movements Muscles of Facial Expression: None, normal Lips and Perioral Area: None, normal Jaw: None, normal Tongue: None, normal,Extremity Movements Upper (arms, wrists, hands, fingers): None, normal Lower (legs, knees, ankles, toes): None, normal, Trunk Movements Neck, shoulders, hips: None, normal, Overall Severity Severity of abnormal movements (highest score from questions above): None, normal Incapacitation due to abnormal movements: None, normal Patient's awareness of abnormal movements (rate only patient's report): No Awareness, Dental Status Current problems with teeth and/or dentures?: No Does patient usually wear dentures?: No  CIWA:    COWS:     Treatment Plan Summary: Daily contact with patient to assess and evaluate symptoms and progress in treatment Medication management  Plan: 1. I will continue current medication regimen. 2. Will consider d/c tomorrow as planned by 1 team  Medical Decision Making Problem Points:  Established problem, stable/improving (1), Review of last therapy session (1), Review of psycho-social stressors (1) and Self-limited or minor (1) Data Points:  Order Aims Assessment (2) Review or order clinical lab tests (1) Review of medication regiment & side effects (2)  I certify that inpatient services furnished can reasonably be expected to improve the patient's condition.   Alydia Gosser,  Keymora Grillot,MD 08/15/2012, 4:50 PM

## 2012-08-15 NOTE — Progress Notes (Signed)
Patient ID: [name], female   DOB: 19-Jan-1946, 66 y.o.   MRN: [id number] D: "I'm doing all right." A: Pt. denies lethality and A/V/H's or other problems.  Pt. Is alert, oriented and states she has no needs tonight before she goes to bed. R: Will continue to monitor her for changes.

## 2012-08-15 NOTE — Progress Notes (Signed)
Psychoeducational Group Note  Date:  08/15/2012 Time:  2000  Group Topic/Focus:  Wrap-Up Group:   The focus of this group is to help patients review their daily goal of treatment and discuss progress on daily workbooks.  Participation Level:  Active  Participation Quality:  Appropriate  Affect:  Appropriate  Cognitive:  Appropriate  Insight:  Engaged  Engagement in Group:  Engaged  Additional Comments:  Patient attended and participated in group tonight. Patient reported having a good day. He daughter and son-in-law visited unexpectantly today. She was schedule to leave tomorrow, however, her family showed up today. She was disappointed because she did not get to leave. She talked with peers today and realized that she was not alone in her situation.  Lita Mains [facility] 08/15/2012, 9:41 PM

## 2012-08-15 NOTE — Progress Notes (Signed)
Psychoeducational Group Note  Date:  08/15/2012 Time:0915am  Group Topic/Focus:  Identifying Needs:   The focus of this group is to help patients identify their personal needs that have been historically problematic and identify healthy behaviors to address their needs.  Participation Level:  Active  Participation Quality:  Appropriate  Affect:  Anxious  Cognitive:  Appropriate  Insight:  Supportive  Engagement in Group:  Supportive  Additional Comments:  Inventory group   Valente David 08/15/2012,10:05 AM

## 2012-08-15 NOTE — Progress Notes (Signed)
Patient ID: [name], female   DOB: 15-Jun-1946, 66 y.o.   MRN: [id number] 08-15-12 @ 1400 nursing shift note: D: their was some confusion about this pts discharge. Pt initially  stated she was unable to leave on 08-15-12, but could leave on 08-16-12. Her family came and was asking about her being discharge today. Extender and md both were both unsure about which day the pt would discharge. A: rn investigated and the suicide risk assessment done by the md, was not in the system so patient was unable to discharge. The discharge order was written for 08-16-12. R: rn did give most of the patients items in the locker to her daughter Duwayne Heck and the patient signed the belongings form. Advised the daughter to call at about 1000 am on 08-16-12 to get a more accurate time of discharge. rn will monitor and q 15 min ck's continue.

## 2012-08-15 NOTE — Clinical Social Work Note (Signed)
[facility] Group Notes:  (Clinical Social Work)  08/15/2012  11:15-11:45AM  Summary of Progress/Problems:   The main focus of today's process group was for the patient to identify ways in which they have in the past sabotaged their own recovery and reasons they may have done this/what they received from doing it.  We then worked to identify a specific plan to avoid doing this when discharged from the hospital for this admission.  The patient slept through the early part of group, said she did not feel well, and was offered opportunity to return to her room to rest.  Type of Therapy:  Group Therapy - Process  PParticipation Level:  None  Participation Quality:  Drowsy  Affect:  asleep  Cognitive:  asleep  Insight:  None  Engagement in Therapy:  None  Modes of Intervention:  Clarification, Education, Limit-setting, Problem-solving, Socialization, Support and Processing, Exploration, Discussion   Ambrose Mantle, LCSW 08/15/2012, 1:02 PM

## 2012-08-15 NOTE — Progress Notes (Signed)
[facility] Group Notes:  (Counselor/Nursing/MHT/Case Management/Adjunct)  08/15/2012 12:18 AM  Type of Therapy:  Psychoeducational Skills  Participation Level:  Active  Participation Quality:  Appropriate  Affect:  Depressed  Cognitive:  Appropriate  Insight:  Supportive  Engagement in Group:  Engaged  Engagement in Therapy:  Engaged  Modes of Intervention:  Education  Summary of Progress/Problems:The patient stated in group that she had a good conversation with her daughter today. Secondly, she stated that she attended nearly all of her groups. Finally, the patient verbalized that she doesn't understand her medication and that she is feeling "worse". She went on to say that she has been experiencing pain and is having difficulty walking. Her goal for tomorrow is to find out more about her medications.    Chatara Lucente S 08/15/2012, 12:18 AM

## 2012-08-15 NOTE — [facility] Suicide Risk Assessment (Signed)
Suicide Risk Assessment  Discharge Assessment     Demographic Factors:  Caucasian  Mental Status Per Nursing Assessment::   On Admission:  NA  Current Mental Status by Physician:  General Appearance: Fairly Groomed  Eye Contact:: Good  Speech: Clear and Coherent  Volume: Normal  Mood: ok  Affect: ristricted  Thought Process: Goal Directed and Linear most of the time  Orientation: Full (Time, Place, and Person)  Thought Content: No AVH  Suicidal Thoughts: No  Homicidal Thoughts: No Memory: Immediate; Good  Recent; Good  Remote; Fair  Judgement: Impaired  Insight: Shallow  Psychomotor Activity: Decreased  Concentration: Fair  Recall: Fair  Akathisia: No   Loss Factors: Decrease in vocational status  Historical Factors: NA  Risk Reduction Factors:   Social support  Continued Clinical Symptoms:  Bipolar Disorder:   Depressive phase  Cognitive Features That Contribute To Risk:  Closed-mindedness    Suicide Risk:  Minimal: No identifiable suicidal ideation.  Patients presenting with no risk factors but with morbid ruminations; may be classified as minimal risk based on the severity of the depressive symptoms  Discharge Diagnoses:   AXIS I:  Bipolar disorder with current episode depressed  AXIS II:  Deferred AXIS III:   Past Medical History  Diagnosis Date  . Obesity   . Allergic   . Seizure   . Skin cancer   . Asthma   . Depression   . GERD (gastroesophageal reflux disease)   . Hypertension   . Tobacco user   . CAD (coronary artery disease)     Moderate disease of the left circumflex managed medically  . Fibromyalgia   . Chronic pain   . Dyslipidemia   . Diverticul disease small and large intestine, no perforati or abscess   . Gastroparesis   . Overactive bladder   . Anxiety   . Hyperlipidemia   . Urge and stress incontinence   . Chronic cystitis   . Rectal prolapse    AXIS IV:  other psychosocial or environmental problems AXIS V:  51-60  moderate symptoms  Plan Of Care/Follow-up recommendations:  Activity:  will give info about f/u  Is patient on multiple antipsychotic therapies at discharge:  No   Has Patient had three or more failed trials of antipsychotic monotherapy by history:  No  Recommended Plan for Multiple Antipsychotic Therapies: Additional reason(s) for multiple antispychotic treatment:  n/a  Wonda Cerise 08/15/2012, 4:54 PM

## 2012-08-16 NOTE — Progress Notes (Signed)
Patient ID: [name], female   DOB: 17-Oct-1945, 66 y.o.   MRN: [id number] 08-16-12 @ 1118 nursing discharge note: pt got her d/c instructions and f/u instructions, as well as discharge medications. She stated she understood all of her discharge instructions. she had no questions regarding her discharge.  She signed for her belongings. She was escorted to the door. Her daughter Duwayne Heck will transport her home.

## 2012-08-16 NOTE — Progress Notes (Signed)
Psychoeducational Group Note  Date:  08/16/2012 Time:  0930am  Group Topic/Focus:  Making Healthy Choices:   The focus of this group is to help patients identify negative/unhealthy choices they were using prior to admission and identify positive/healthier coping strategies to replace them upon discharge.  Participation Level:  Did Not Attend  Participation Quality:    Affect: Cognitive:   Insight:  Engagement in Group:  Additional Comments:  Inventory group   Valente David 08/16/2012,9:39 AM

## 2012-08-16 NOTE — Clinical Social Work Note (Signed)
[facility] Group Notes: (Clinical Social Work)   08/16/2012   11:15-11:45am   Type of Therapy:  Group Therapy   Participation Level:  Did Not Attend    Ambrose Mantle, LCSW 08/16/2012, 12:33 PM

## 2012-08-18 NOTE — Progress Notes (Signed)
Patient Discharge Instructions:  After Visit Summary (AVS):   Faxed to:  08/18/12 Psychiatric Admission Assessment Note:   Faxed to:  08/18/12 Suicide Risk Assessment - Discharge Assessment:   Faxed to:  08/18/12 Faxed/Sent to the Next Level Care provider:  08/18/12 Faxed to [facility] @ [phone]  Jerelene Redden, 08/18/2012, 3:59 PM

## 2012-08-19 LAB — CARBAMAZEPINE, FREE AND TOTAL
Carbamazepine Metabolite/: 1.3 ug/mL
Carbamazepine Metabolite: 1.4 ug/mL — ABNORMAL HIGH (ref 0.1–1.0)
Carbamazepine, Bound: 7.1 ug/mL

## 2012-08-20 NOTE — Discharge Summary (Signed)
Physician Discharge Summary Note  Patient:  [name] is an 66 y.o., female MRN:  [id number] DOB:  27-Jan-1946 Patient phone:  [phone] (home)  Patient address:   [address] [place] [address],   Date of Admission:  08/11/2012 Date of Discharge:  08/16/2012  Reason for Admission:  Suicidal ideation Discharge Diagnoses: Principal Problem:  *Bipolar disorder with current episode depressed Discharge Diagnoses:  AXIS I: Bipolar disorder with current episode depressed  AXIS II: Deferred  AXIS III:  Past Medical History   Diagnosis  Date   .  Obesity    .  Allergic    .  Seizure    .  Skin cancer    .  Asthma    .  Depression    .  GERD (gastroesophageal reflux disease)    .  Hypertension    .  Tobacco user    .  CAD (coronary artery disease)      Moderate disease of the left circumflex managed medically   .  Fibromyalgia    .  Chronic pain    .  Dyslipidemia    .  Diverticul disease small and large intestine, no perforati or abscess    .  Gastroparesis    .  Overactive bladder    .  Anxiety    .  Hyperlipidemia    .  Urge and stress incontinence    .  Chronic cystitis    .  Rectal prolapse     AXIS IV: other psychosocial or environmental problems  AXIS V: 51-60 moderate symptoms  Review of Systems  Constitutional: Negative.  Negative for fever, chills, weight loss, malaise/fatigue and diaphoresis.  HENT: Negative for congestion and sore throat.   Eyes: Negative for blurred vision, double vision and photophobia.  Respiratory: Negative for cough, shortness of breath and wheezing.   Cardiovascular: Negative for chest pain, palpitations and PND.  Gastrointestinal: Negative for heartburn, nausea, vomiting, abdominal pain, diarrhea and constipation.  Musculoskeletal: Negative for myalgias, joint pain and falls.  Neurological: Negative for dizziness, tingling, tremors, sensory change, speech change, focal weakness, seizures, loss of consciousness,  weakness and headaches.  Endo/Heme/Allergies: Negative for polydipsia. Does not bruise/bleed easily.  Psychiatric/Behavioral: Negative for depression, suicidal ideas, hallucinations, memory loss and substance abuse. The patient is not nervous/anxious and does not have insomnia.     Level of Care:  OP  Hospital Course:  Tanganyika was admitted after she called 911 to take her to the hospital, "because her husband left her 2months previously and she has not been taking her medication."  She was given medical clearance and transferred to [facility] for stabilization, crisis management, and medication management.      Shadonna is a 66 year old white female recently separated with multiple medical problems. On admission her Review of symptoms was grossly positive for every system by her report.  She is not a consistent historian and her story changed a number of times. Shacarra denied suicidal ideation but could not contract for safety and was admitted.  Review of her home medication list revealed significant polypharmacy and non compliance.      Her many symptoms were treated with a significant reduction in medication including discontinuation of PRN klonopin. She was placed on BID dosing. Her chronic use of phenergan pills and daily suppositories were also discontinued due to the increased risk for movement disorders and over sedation.  She was also educated in avoiding medication that was over sedating due to her  history of increased blood sugars. Her vistaril and robaxin were both discontinued. As her HgbA1C was normal at 5.7 her glycemics were held with no increase in her blood sugars. Her blood pressure remained stable and she was continued on her Bystolic, Norvasc, and Lotensin.     See was seen each day by a clinical provider and her response to treatment was documented per her verbal response to clinical inquiry, affect, mood and her written response on daily self inventories completed by the patient.  She was  encouraged to participate in unit and group programming to process her feelings in a therapeutic and supportive milieu.  Her family contacted the hospital and stated that they wished to transfer her to [place] where she could be close to her daughter who would be her primary caregiver.  Amberleigh agreed that this would be in her best interest and requested to be allowed to go.      On the day of discharge she was in much improved condition, alert and oriented x 3, denied AVH, her mood was improved, she voiced no SI/HI and was very much looking forward to leaving the hospital and starting her new life in [place].   Consults:  None  Significant Diagnostic Studies:  None  Discharge Vitals:   Blood pressure 152/87, pulse 80, temperature 96.8 F (36 C), temperature source Oral, resp. rate 20, height 5' 4.25" (1.632 m), weight 79.833 kg (176 lb), SpO2 97.00%. Body mass index is 29.98 kg/(m^2). Lab Results:   No results found for this or any previous visit (from the past 72 hour(s)).  Physical Findings: AIMS: Facial and Oral Movements Muscles of Facial Expression: None, normal Lips and Perioral Area: None, normal Jaw: None, normal Tongue: None, normal,Extremity Movements Upper (arms, wrists, hands, fingers): None, normal Lower (legs, knees, ankles, toes): None, normal, Trunk Movements Neck, shoulders, hips: None, normal, Overall Severity Severity of abnormal movements (highest score from questions above): None, normal Incapacitation due to abnormal movements: None, normal Patient's awareness of abnormal movements (rate only patient's report): No Awareness, Dental Status Current problems with teeth and/or dentures?: No Does patient usually wear dentures?: No  CIWA:    COWS:     Psychiatric Specialty Exam: See Psychiatric Specialty Exam and Suicide Risk Assessment completed by Attending Physician prior to discharge.  Discharge destination:  Home  Is patient on multiple antipsychotic  therapies at discharge:  No   Has Patient had three or more failed trials of antipsychotic monotherapy by history:  No  Recommended Plan for Multiple Antipsychotic Therapies: Not applicable  Discharge Orders    Future Orders Please Complete By Expires   Diet - low sodium heart healthy      Increase activity slowly      Discharge instructions      Comments:   Take your medications as prescribed. Some of your medications have been discontinued due to the increased side effects and changes in mental status. You will need to contact your Primary Care Physician to get refills on the medications that you need to stay on.   Be sure to keep ALL follow up appointments as scheduled. This is to ensure getting your refills on time to avoid any interruption in your medication.  If you find that you can not keep your appointment, call and reschedule. Be sure to tell the nurse if you will need a refill before your appointment.       Medication List     As of 08/20/2012  2:49 PM  STOP taking these medications         gabapentin 100 MG capsule   Commonly known as: NEURONTIN      glimepiride 2 MG tablet   Commonly known as: AMARYL      glycopyrrolate 2 MG tablet   Commonly known as: ROBINUL      hydrOXYzine 25 MG capsule   Commonly known as: VISTARIL      lidocaine 5 %   Commonly known as: LIDODERM      promethazine 12.5 MG tablet   Commonly known as: PHENERGAN      promethazine 25 MG suppository   Commonly known as: PHENERGAN      VITAMIN E PO      TAKE these medications      Indication    albuterol-ipratropium 18-103 MCG/ACT inhaler   Commonly known as: COMBIVENT   Inhale 2 puffs into the lungs every 6 (six) hours as needed for wheezing.    Indication: Disease involving Spasms of the Lungs      amLODipine 10 MG tablet   Commonly known as: NORVASC   Take 1 tablet (10 mg total) by mouth daily. For hypertension.    Indication: High Blood Pressure      aspirin 81 MG EC tablet    Take 1 tablet (81 mg total) by mouth daily. For platelette aggregation.       benazepril 20 MG tablet   Commonly known as: LOTENSIN   Take 1 tablet (20 mg total) by mouth daily. For hypertension.    Indication: High Blood Pressure      carbamazepine 200 MG tablet   Commonly known as: TEGRETOL   Take 1 tablet (200 mg total) by mouth 3 (three) times daily.    Indication: seizures      clonazePAM 1 MG tablet   Commonly known as: KLONOPIN   Take 1 tablet (1 mg total) by mouth 2 (two) times daily as needed. anxiety    Indication: Panic Disorder      DULoxetine 60 MG capsule   Commonly known as: CYMBALTA   Take 1 capsule (60 mg total) by mouth daily. For anxiety and depression.    Indication: Major Depressive Disorder      fish oil-omega-3 fatty acids 1000 MG capsule   Take 2 capsules (2 g total) by mouth daily. For lipid control.    Indication: High Amount of Cholesterol in the Blood      nebivolol 10 MG tablet   Commonly known as: BYSTOLIC   Take 1 tablet (10 mg total) by mouth daily. For hypertension.    Indication: High Blood Pressure      nitroGLYCERIN 0.4 MG SL tablet   Commonly known as: NITROSTAT   Place 1 tablet (0.4 mg total) under the tongue every 5 (five) minutes as needed for chest pain.       pantoprazole 40 MG tablet   Commonly known as: PROTONIX   Take 1 tablet (40 mg total) by mouth 2 (two) times daily. For reflux.    Indication: Gastroesophageal Reflux Disease      pravastatin 80 MG tablet   Commonly known as: PRAVACHOL   Take 1 tablet (80 mg total) by mouth every evening. For lipid control.    Indication: Type II A Hyperlipidemia      triamcinolone 55 MCG/ACT nasal inhaler   Commonly known as: NASACORT   Place 2 sprays into the nose daily. For allergies.    Indication: Hayfever  Follow-up Information    Follow up with [facility]. On 08/20/2012. (8AM)    Contact information:   [address] [place]  [place]  [phone]         Follow-up recommendations:  As noted above  Comments:  She is at risk for over sedation due to the number of medications she is taking. She is cautioned again to take medications as directed. Total Discharge Time:  >30 minutes.  Signed: Rona Ravens. Yarden Manuelito PAC  08/20/2012, 2:49 PM

## 2012-09-10 ENCOUNTER — Encounter: Payer: Self-pay | Admitting: Gastroenterology

## 2012-11-07 ENCOUNTER — Telehealth: Payer: Self-pay | Admitting: Physician Assistant

## 2012-11-07 MED ORDER — NEBIVOLOL HCL 10 MG PO TABS: 10.0000 mg | ORAL_TABLET | Freq: Every day | ORAL | Status: DC

## 2012-11-07 NOTE — Telephone Encounter (Signed)
Pt called answering service regarding being out of her medication. She has been out of Bystolic. She notes high blood pressure of 157/74 as well as pulse 127. She had been having intermittent chest pain for 3 days and has taken a NTG with some relief. She also notes some lightheadedness. Given h/o CAD and above symptoms, I advised she proceed to ER for evaluation of chest pain. I told her I could call in the rx of Bystolic so that she was able to have it on hand aferwards, but strongly encouraged her to go to the ER. She verbalized understanding but did sound reluctant to do so. She understands risks of not being evaluated.  Dayna Dunn PA-C

## 2012-12-02 ENCOUNTER — Other Ambulatory Visit: Payer: Self-pay | Admitting: *Deleted

## 2012-12-02 DIAGNOSIS — I2510 Atherosclerotic heart disease of native coronary artery without angina pectoris: Secondary | ICD-10-CM

## 2012-12-02 MED ORDER — AMLODIPINE BESYLATE 10 MG PO TABS: 10.0000 mg | ORAL_TABLET | Freq: Every day | ORAL | Status: DC

## 2012-12-02 MED ORDER — BENAZEPRIL HCL 20 MG PO TABS: 20.0000 mg | ORAL_TABLET | Freq: Every day | ORAL | Status: DC

## 2012-12-02 MED ORDER — NITROGLYCERIN 0.4 MG SL SUBL: 0.4000 mg | SUBLINGUAL_TABLET | SUBLINGUAL | Status: DC | PRN

## 2012-12-24 ENCOUNTER — Telehealth: Payer: Self-pay | Admitting: Family Medicine

## 2012-12-24 NOTE — Telephone Encounter (Signed)
Caller: [name]/Patient; Phone: [phone]; Reason for Call: Pt states she is a new pt and her appt is May1, 2014.  Pt states she is under alot of stress and her blood sugars have been 205- 350 when normally they are 103 range.  Pt states her pervious PCP left the practice and she does not have PCP at this time until she see Dr Milinda Cave next week.  Pt needs refill on her Glimepiride 2mg , due to increasing dosage with high BS.  PLEASE F/U WITH PT CONCERNING REFILL ON MEDICATION.  THANK YOU.

## 2012-12-24 NOTE — Telephone Encounter (Signed)
Pls advise patient that I'm sorry but I am unable to rx any med for her until I actually see her (she is technically not even my patient yet).-thx

## 2012-12-24 NOTE — Telephone Encounter (Signed)
Please advise 

## 2012-12-25 NOTE — Telephone Encounter (Signed)
Tried to patient but was unsuccessful at this time. Will try again later.

## 2012-12-28 ENCOUNTER — Telehealth: Payer: Self-pay | Admitting: Gastroenterology

## 2012-12-28 ENCOUNTER — Telehealth: Payer: Self-pay | Admitting: Internal Medicine

## 2012-12-28 DIAGNOSIS — I2510 Atherosclerotic heart disease of native coronary artery without angina pectoris: Secondary | ICD-10-CM

## 2012-12-28 NOTE — Telephone Encounter (Signed)
Please advise 

## 2012-12-28 NOTE — Telephone Encounter (Signed)
Patient Information:  Caller Name: Carolle  Phone: [phone]  Patient: [name], [name]  Gender: Female  DOB: 1945-09-17  Age: 67 Years  PCP: Oliver Barre (Adults only)  Office Follow Up:  Does the office need to follow up with this patient?: Yes  Instructions For The Office: Please call patient at above phone number.  RN Note:  Patient has not see De. John in 2 years but is out of her diabetes medication.  Sherequests a prescription for Glimepiride 2 mg taken daily.  Her pharmacy is [facility] in [address], [phone].  Patient is in a bad position being out of her med and no transportation.  She asks that Dr. Jonny Ruiz to please prescribe this medication.  Symptoms  Reason For Call & Symptoms: Patient out of Glimepiride 2mg  that she takes daily.  Last took 1/2 tablet on 12/27/2012.  Reviewed Health History In EMR: Yes  Reviewed Medications In EMR: Yes  Reviewed Allergies In EMR: Yes  Reviewed Surgeries / Procedures: Yes  Date of Onset of Symptoms: 12/28/2012  Guideline(s) Used:  Diabetes - High Blood Sugar  Disposition Per Guideline:   Home Care  Reason For Disposition Reached:   Blood glucose 60-240 mg/dl (3.5 -13 mmol/l)  Advice Given:  General  Definition of hyperglycemia: - Fasting blood glucose more than 140 mg/dL (7.5 mmol/l) or random blood glucose more than 200 mg/dL (11 mmol/l).  Symptoms of mild hyperglycemia: frequent urination, increased thirst, fatigue, blurred vision.  Treatment - Liquids  Drink at least one glass (8 oz or 240 ml) of water per hour for the next 4 hours. (Reason: adequate hydration will reduce hyperglycemia).  Generally, you should try to drink 6-8 glasses of water each day.  Treatment - Diabetes Medications  : Continue taking your diabetes pills.  Measure and Record Your Blood Glucose  Every day you should measure your blood glucose before breakfast and before going to bed.  Record the results and show them to your doctor  at your next office visit.  Daily Blood Glucose Goals  Pre-prandial (before meal): 70-130 mg/dL (2.9-5.6 mmol/l)  Post-prandial (2-3 hours after a meal): Less than 180 mg/dL (10 mmol/l)  Daily Blood Glucose Goals - Type 1 or 2 Diabetes in Pregnancy  You and your doctor should decide on what your blood glucose goals should be. Typical goals for most pregnant women who perform daily finger-stick blood testing at home are as shown below.  Pre-prandial (before meal): 60 - 99 mg/dL (3.3 - 5.4 mmol/l)  Peak post-prandial (after a meal): 100 - 129 (5.4 - 7.1)  A1C level less than 6.0%  Patient Refused Recommendation:  Patient Requests Prescription  Glimepiride 2mg , taken daily.

## 2012-12-28 NOTE — Telephone Encounter (Signed)
Okay to refill? 

## 2012-12-29 MED ORDER — PRAVASTATIN SODIUM 80 MG PO TABS: 80.0000 mg | ORAL_TABLET | Freq: Every evening | ORAL | Status: DC

## 2012-12-29 NOTE — Telephone Encounter (Signed)
Patient informed, but the patient has no transportation and will call back to schedule appt. Once she is able to.

## 2012-12-29 NOTE — Telephone Encounter (Signed)
Called pt to inform med sent in. Could not get in touch with her

## 2012-12-29 NOTE — Telephone Encounter (Signed)
Pt last seen by me dec 2011  Needs OV

## 2012-12-29 NOTE — Telephone Encounter (Signed)
Patient informed. 

## 2012-12-31 ENCOUNTER — Ambulatory Visit: Payer: Self-pay | Admitting: Family Medicine

## 2013-01-14 ENCOUNTER — Encounter: Payer: Self-pay | Admitting: Family Medicine

## 2013-01-14 ENCOUNTER — Ambulatory Visit (INDEPENDENT_AMBULATORY_CARE_PROVIDER_SITE_OTHER): Payer: Medicare Other | Admitting: Family Medicine

## 2013-01-14 VITALS — BP 131/76 | HR 80 | Temp 97.8°F | Resp 16 | Ht 64.0 in | Wt 185.2 lb

## 2013-01-14 DIAGNOSIS — Z79899 Other long term (current) drug therapy: Secondary | ICD-10-CM

## 2013-01-14 DIAGNOSIS — IMO0001 Reserved for inherently not codable concepts without codable children: Secondary | ICD-10-CM

## 2013-01-14 DIAGNOSIS — Z8659 Personal history of other mental and behavioral disorders: Secondary | ICD-10-CM

## 2013-01-14 DIAGNOSIS — G40909 Epilepsy, unspecified, not intractable, without status epilepticus: Secondary | ICD-10-CM

## 2013-01-14 DIAGNOSIS — Z7189 Other specified counseling: Secondary | ICD-10-CM

## 2013-01-14 DIAGNOSIS — I10 Essential (primary) hypertension: Secondary | ICD-10-CM

## 2013-01-14 DIAGNOSIS — R569 Unspecified convulsions: Secondary | ICD-10-CM

## 2013-01-14 DIAGNOSIS — Z7689 Persons encountering health services in other specified circumstances: Secondary | ICD-10-CM

## 2013-01-14 LAB — COMPREHENSIVE METABOLIC PANEL
ALT: 11 U/L (ref 0–35)
CO2: 22 mEq/L (ref 19–32)
Calcium: 9.1 mg/dL (ref 8.4–10.5)
Chloride: 107 mEq/L (ref 96–112)
Creat: 0.75 mg/dL (ref 0.50–1.10)
Glucose, Bld: 82 mg/dL (ref 70–99)
Total Bilirubin: 0.2 mg/dL — ABNORMAL LOW (ref 0.3–1.2)

## 2013-01-14 MED ORDER — METHOCARBAMOL 500 MG PO TABS: 500.0000 mg | ORAL_TABLET | Freq: Three times a day (TID) | ORAL | Status: DC

## 2013-01-14 MED ORDER — OMEPRAZOLE 20 MG PO CPDR: 20.0000 mg | DELAYED_RELEASE_CAPSULE | Freq: Two times a day (BID) | ORAL | Status: DC

## 2013-01-14 MED ORDER — GLYCOPYRROLATE 2 MG PO TABS: 2.0000 mg | ORAL_TABLET | Freq: Three times a day (TID) | ORAL | Status: DC | PRN

## 2013-01-14 MED ORDER — MAGIC MOUTHWASH W/LIDOCAINE: ORAL | Status: DC

## 2013-01-14 MED ORDER — CARBAMAZEPINE 200 MG PO TABS: 200.0000 mg | ORAL_TABLET | Freq: Three times a day (TID) | ORAL | Status: DC

## 2013-01-14 MED ORDER — BENAZEPRIL HCL 20 MG PO TABS: 20.0000 mg | ORAL_TABLET | Freq: Every day | ORAL | Status: DC

## 2013-01-14 MED ORDER — AMLODIPINE BESYLATE 10 MG PO TABS: 10.0000 mg | ORAL_TABLET | Freq: Every day | ORAL | Status: DC

## 2013-01-14 MED ORDER — PREGABALIN 225 MG PO CAPS: 225.0000 mg | ORAL_CAPSULE | Freq: Two times a day (BID) | ORAL | Status: DC

## 2013-01-14 MED ORDER — GABAPENTIN 100 MG PO CAPS: 200.0000 mg | ORAL_CAPSULE | Freq: Every day | ORAL | Status: DC

## 2013-01-14 MED ORDER — DULOXETINE HCL 60 MG PO CPEP: 60.0000 mg | ORAL_CAPSULE | Freq: Every day | ORAL | Status: DC

## 2013-01-14 MED ORDER — DICLOFENAC SODIUM 1 % TD GEL: 2.0000 g | TRANSDERMAL | Status: DC | PRN

## 2013-01-14 MED ORDER — CLONAZEPAM 1 MG PO TABS: 1.0000 mg | ORAL_TABLET | Freq: Two times a day (BID) | ORAL | Status: DC | PRN

## 2013-01-14 MED ORDER — OXYBUTYNIN CHLORIDE 10 % TD GEL: 1.0000 | Freq: Every day | TRANSDERMAL | Status: DC

## 2013-01-14 MED ORDER — GLIMEPIRIDE 2 MG PO TABS: 2.0000 mg | ORAL_TABLET | Freq: Every day | ORAL | Status: DC

## 2013-01-14 MED ORDER — FLUTICASONE PROPIONATE 0.05 % EX CREA: TOPICAL_CREAM | CUTANEOUS | Status: DC

## 2013-01-14 MED ORDER — NEBIVOLOL HCL 10 MG PO TABS: 10.0000 mg | ORAL_TABLET | Freq: Every day | ORAL | Status: DC

## 2013-01-14 MED ORDER — IPRATROPIUM-ALBUTEROL 18-103 MCG/ACT IN AERO: 2.0000 | INHALATION_SPRAY | Freq: Four times a day (QID) | RESPIRATORY_TRACT | Status: DC | PRN

## 2013-01-14 MED ORDER — TRIAMCINOLONE ACETONIDE(NASAL) 55 MCG/ACT NA INHA: 2.0000 | Freq: Every day | NASAL | Status: DC
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
## 2013-01-15 LAB — HEMOGLOBIN A1C
Hgb A1c MFr Bld: 5.5 % (ref <5.7)
Mean Plasma Glucose: 111 mg/dL (ref <117)

## 2013-01-15 LAB — CBC WITH DIFFERENTIAL/PLATELET
Eosinophils Absolute: 0.4 10*3/uL (ref 0.0–0.7)
Eosinophils Relative: 4 % (ref 0–5)
HCT: 38.9 % (ref 36.0–46.0)
Hemoglobin: 13.1 g/dL (ref 12.0–15.0)
Lymphocytes Relative: 51 % — ABNORMAL HIGH (ref 12–46)
Lymphs Abs: 6.2 10*3/uL — ABNORMAL HIGH (ref 0.7–4.0)
MCH: 28.2 pg (ref 26.0–34.0)
MCV: 83.7 fL (ref 78.0–100.0)
Monocytes Absolute: 0.6 10*3/uL (ref 0.1–1.0)
Monocytes Relative: 5 % (ref 3–12)
Platelets: 356 10*3/uL (ref 150–400)
RBC: 4.65 MIL/uL (ref 3.87–5.11)
WBC: 12 10*3/uL — ABNORMAL HIGH (ref 4.0–10.5)

## 2013-01-19 ENCOUNTER — Telehealth: Payer: Self-pay | Admitting: Emergency Medicine

## 2013-01-19 DIAGNOSIS — Z8659 Personal history of other mental and behavioral disorders: Secondary | ICD-10-CM

## 2013-01-19 DIAGNOSIS — Z79899 Other long term (current) drug therapy: Secondary | ICD-10-CM

## 2013-01-19 DIAGNOSIS — I10 Essential (primary) hypertension: Secondary | ICD-10-CM

## 2013-01-19 DIAGNOSIS — Z7689 Persons encountering health services in other specified circumstances: Secondary | ICD-10-CM | POA: Insufficient documentation

## 2013-01-19 DIAGNOSIS — R569 Unspecified convulsions: Secondary | ICD-10-CM

## 2013-01-19 DIAGNOSIS — G40909 Epilepsy, unspecified, not intractable, without status epilepticus: Secondary | ICD-10-CM

## 2013-01-19 MED ORDER — TEGRETOL 200 MG PO TABS: 200.0000 mg | ORAL_TABLET | Freq: Three times a day (TID) | ORAL | Status: DC

## 2013-01-19 NOTE — Assessment & Plan Note (Signed)
BP normal here today. Continue bystolic, lotensin, and amlodipine.

## 2013-01-19 NOTE — Assessment & Plan Note (Signed)
Continue glimeperide 2mg  qAM and check HbA1c today.

## 2013-01-19 NOTE — Assessment & Plan Note (Addendum)
Per history it sounds like either she has hx of bipolar d/o type 2. Continue current psych meds and gather more info as we go: neurontin 100mg , 2 qhs, tegretol 200mg  tid, clonazepam bid, cymbalta.

## 2013-01-19 NOTE — Telephone Encounter (Signed)
Patient called this morning and couldn't remember if she told Dr.McGowen that she could not take the generic Tegratol it has to be the name brand and she needs it faxed to her pharmacy if it has not been faxed already,

## 2013-01-19 NOTE — Assessment & Plan Note (Signed)
Reviewed history, meds, pt required RFs of all.  I did 1 mo supply of her clonazepam for now, with no RFs. Obtain old records.

## 2013-01-19 NOTE — Telephone Encounter (Signed)
Verified w/pharmacy that carbamazepine has not been filled; provider informed & new Brand Name Only Tegretol Rx to pharmacy/[facility]

## 2013-01-19 NOTE — Assessment & Plan Note (Signed)
Stable per pt history: continue tegretol. Check CMET and CBC.

## 2013-01-21 ENCOUNTER — Ambulatory Visit: Payer: Self-pay | Admitting: Family Medicine

## 2013-01-27 ENCOUNTER — Ambulatory Visit: Payer: Self-pay | Admitting: Family Medicine

## 2013-01-28 ENCOUNTER — Encounter: Payer: Self-pay | Admitting: Family Medicine

## 2013-01-28 ENCOUNTER — Ambulatory Visit (INDEPENDENT_AMBULATORY_CARE_PROVIDER_SITE_OTHER)
Admission: RE | Admit: 2013-01-28 | Discharge: 2013-01-28 | Disposition: A | Discharge status: Home / Self Care | Payer: Medicare Other | Source: Ambulatory Visit | Attending: Family Medicine | Admitting: Family Medicine

## 2013-01-28 ENCOUNTER — Ambulatory Visit (INDEPENDENT_AMBULATORY_CARE_PROVIDER_SITE_OTHER): Payer: Medicare Other | Admitting: Family Medicine

## 2013-01-28 VITALS — BP 126/72 | HR 74 | Temp 97.6°F | Resp 16 | Wt 184.2 lb

## 2013-01-28 DIAGNOSIS — J449 Chronic obstructive pulmonary disease, unspecified: Secondary | ICD-10-CM

## 2013-01-28 DIAGNOSIS — L981 Factitial dermatitis: Secondary | ICD-10-CM

## 2013-01-28 DIAGNOSIS — D72820 Lymphocytosis (symptomatic): Secondary | ICD-10-CM | POA: Insufficient documentation

## 2013-01-28 DIAGNOSIS — S7001XA Contusion of right hip, initial encounter: Secondary | ICD-10-CM

## 2013-01-28 DIAGNOSIS — E119 Type 2 diabetes mellitus without complications: Secondary | ICD-10-CM

## 2013-01-28 DIAGNOSIS — S7000XA Contusion of unspecified hip, initial encounter: Secondary | ICD-10-CM | POA: Insufficient documentation

## 2013-01-28 DIAGNOSIS — M25569 Pain in unspecified knee: Secondary | ICD-10-CM

## 2013-01-28 DIAGNOSIS — M25561 Pain in right knee: Secondary | ICD-10-CM

## 2013-01-28 DIAGNOSIS — N952 Postmenopausal atrophic vaginitis: Secondary | ICD-10-CM

## 2013-01-28 HISTORY — DX: Factitial dermatitis: L98.1

## 2013-01-28 LAB — CBC WITH DIFFERENTIAL/PLATELET
Eosinophils Relative: 3 % (ref 0–5)
Lymphocytes Relative: 35 % (ref 12–46)
Lymphs Abs: 5 10*3/uL — ABNORMAL HIGH (ref 0.7–4.0)
MCV: 82 fL (ref 78.0–100.0)
Neutrophils Relative %: 56 % (ref 43–77)
Platelets: 375 10*3/uL (ref 150–400)
RBC: 4.61 MIL/uL (ref 3.87–5.11)
WBC: 14.3 10*3/uL — ABNORMAL HIGH (ref 4.0–10.5)

## 2013-01-28 MED ORDER — ESTRADIOL 0.1 MG/GM VA CREA: TOPICAL_CREAM | VAGINAL | Status: DC

## 2013-01-28 MED ORDER — HYDROCODONE-ACETAMINOPHEN 5-325 MG PO TABS: ORAL_TABLET | ORAL | Status: DC

## 2013-01-28 MED ORDER — FEXOFENADINE HCL 180 MG PO TABS: 180.0000 mg | ORAL_TABLET | Freq: Every day | ORAL | Status: DC

## 2013-01-28 MED ORDER — ALBUTEROL SULFATE (2.5 MG/3ML) 0.083% IN NEBU: 2.5000 mg | INHALATION_SOLUTION | Freq: Four times a day (QID) | RESPIRATORY_TRACT | Status: DC | PRN

## 2013-01-28 NOTE — Assessment & Plan Note (Signed)
Mild leukocytosis for a couple of years on review of labs, but only recently with absolute lymphocytosis. As per pathologist's rec's, will repeat this today and if still lymphocytosis will ask hem/onc to evaluate her.

## 2013-01-28 NOTE — Progress Notes (Signed)
OFFICE NOTE  01/28/2013  CC:  Chief Complaint  Patient presents with  . Follow-up    7-10 day [Chronic Issues]; pt had fall out of bed, reports right hip pain that radiates to thigh x1 wk     HPI: Patient is a 67 y.o. Caucasian female who is here for 2 wk f/u, mgmt of chronic med issues, also has right hip pain today s/p a fall out of her "[place] king size bed" about a week ago. The pain has subsided quite a bit. Still with quite a bit of swelling in right knee s/p an injury about 5 mo ago in which a dog ran into back of her leg and buckled her knee.  No w/u has been done for this per her report today. She has been to the ED on one occasion since the injury and reports being given IV morphine but doesn't recall anything else being done.  She asks for albut neb sol'n b/c she says she has frequent need of her combivent inhaler and it doesn't help as much as albut neb has in the past. She describes PND and feeling of needing to clear throat and "cough something up" more than she does episodes of wheezing and SOB. She says pantoprazole has not worked as well for her GERD as omeprazole 20mg  qd did and she would like to switch back.  She reports that her skin lesions are improving with use of the cutivate cream I rx'd last visit.  She reports a long hx of postmenopausal vaginal atrophy and was on estrace cream in the past but ran out of this and asks to restart it today.  Pertinent PMH:  Past Medical History  Diagnosis Date  . Obesity   . Allergic   . Seizure   . Skin cancer   . Asthma   . Depression   . GERD (gastroesophageal reflux disease)   . Hypertension   . Tobacco user   . CAD (coronary artery disease)     Moderate disease of the left circumflex managed medically  . Fibromyalgia   . Chronic pain   . Dyslipidemia   . Diverticul disease small and large intestine, no perforati or abscess   . Gastroparesis   . Overactive bladder   . Anxiety   . Hyperlipidemia   . Urge  and stress incontinence   . Chronic cystitis   . Rectal prolapse    Past Surgical History  Procedure Laterality Date  . Appendectomy    . Abdominal hysterectomy    . Tubal ligation    . Vagiocele    . Bladder surgery    . Eye surgery    . Rectocele repair    . Cholecystectomy    . Right foot surgery    . Cosmetic ear surgery    . Neck surgery     Past family and social history reviewed and there are no changes since the patient's last office visit with me.   MEDS:  Outpatient Prescriptions Prior to Visit  Medication Sig Dispense Refill  . albuterol-ipratropium (COMBIVENT) 18-103 MCG/ACT inhaler Inhale 2 puffs into the lungs every 6 (six) hours as needed for wheezing.  1 Inhaler  1  . Alum & Mag Hydroxide-Simeth (MAGIC MOUTHWASH W/LIDOCAINE) SOLN 1 tsp po swish/gargle and spit four times per day as needed for mouth pain  200 mL  5  . amLODipine (NORVASC) 10 MG tablet Take 1 tablet (10 mg total) by mouth daily. For hypertension.  30 tablet  3  . aspirin 81 MG EC tablet Take 1 tablet (81 mg total) by mouth daily. For platelette aggregation.  30 tablet  0  . benazepril (LOTENSIN) 20 MG tablet Take 1 tablet (20 mg total) by mouth daily. For hypertension.  30 tablet  3  . clonazePAM (KLONOPIN) 1 MG tablet Take 1 tablet (1 mg total) by mouth 2 (two) times daily as needed. anxiety  60 tablet  0  . diclofenac sodium (VOLTAREN) 1 % GEL Apply 2 g topically as needed.  100 g  3  . DULoxetine (CYMBALTA) 60 MG capsule Take 1 capsule (60 mg total) by mouth daily. For anxiety and depression.  30 capsule  3  . fish oil-omega-3 fatty acids 1000 MG capsule Take 2 capsules (2 g total) by mouth daily. For lipid control.      . fluticasone (CUTIVATE) 0.05 % cream Apply to affected areas of skin twice daily as needed  60 g  1  . gabapentin (NEURONTIN) 100 MG capsule Take 2 capsules (200 mg total) by mouth at bedtime.  60 capsule  3  . glimepiride (AMARYL) 2 MG tablet Take 1 tablet (2 mg total) by mouth  daily before breakfast.  30 tablet  3  . glycopyrrolate (ROBINUL) 2 MG tablet Take 1 tablet (2 mg total) by mouth 3 (three) times daily as needed.  90 tablet  3  . lidocaine (LIDODERM) 5 % Place 3 patches onto the skin. Remove & Discard patch within 12 hours or as directed by MD      . methocarbamol (ROBAXIN) 500 MG tablet Take 1 tablet (500 mg total) by mouth 3 (three) times daily.  90 tablet  3  . nebivolol (BYSTOLIC) 10 MG tablet Take 1 tablet (10 mg total) by mouth daily.  30 tablet  3  . nitroGLYCERIN (NITROSTAT) 0.4 MG SL tablet Place 1 tablet (0.4 mg total) under the tongue every 5 (five) minutes as needed for chest pain.  90 tablet  3  . omeprazole (PRILOSEC) 20 MG capsule Take 1 capsule (20 mg total) by mouth 2 (two) times daily.  60 capsule  3  . Oxybutynin Chloride (GELNIQUE) 10 % GEL Place 1 application onto the skin daily.  1 g  3  . pantoprazole (PROTONIX) 40 MG tablet Take 1 tablet (40 mg total) by mouth 2 (two) times daily. For reflux.      . pregabalin (LYRICA) 225 MG capsule Take 1 capsule (225 mg total) by mouth 2 (two) times daily.  60 capsule  3  . TEGRETOL 200 MG tablet Take 1 tablet (200 mg total) by mouth 3 (three) times daily. TEGRETOL BRAND NAME ONLY  90 tablet  3  . triamcinolone (NASACORT) 55 MCG/ACT nasal inhaler Place 2 sprays into the nose daily. For allergies.  1 Inhaler  3   No facility-administered medications prior to visit.  Pravastatin 80mg  po qd  PE: Blood pressure 126/72, pulse 74, temperature 97.6 F (36.4 C), temperature source Oral, resp. rate 16, weight 184 lb 4 oz (83.575 kg), SpO2 97.00%. Gen: Alert, well appearing.  Patient is oriented to person, place, time, and situation. ENT: Ears: EACs clear, normal epithelium.  TMs with good light reflex and landmarks bilaterally.  Eyes: no injection, icteris, swelling, or exudate.  EOMI, PERRLA. Nose: no drainage or turbinate edema/swelling.  No injection or focal lesion.  Mouth: lips without lesion/swelling.   Oral mucosa pink and moist.  Oropharynx without erythema, exudate, or swelling.  CV: RRR, no m/r/g.  LUNGS: CTA bilat, nonlabored resps, good aeration in all lung fields. SKIN: a few scattered pinkish, irregular shaped excoriations are noted---no moist lesions.  Back: mild TTP in right lumbosacral region, extending to right gluteal region --diffuse.  No tenderness of greater troch region or proximal femur. Right hip ROM intact with no significant pain. Right knee: no warmth or erythema.  There is a trace effusion palpable on exam.  Mild tenderness with patellar grind.   She has medial>lateral joint line tenderness, + mcmurray's--medial>lateral, lachman's test elicited extreme pain and pt apprehension but minimal, if any, excess laxity.  No medial or lateral collateral ligament tenderness or laxity. She has mild TTP in proximal anterior tibia region.  LABS: none today.  Recent labs:  Lab Results  Component Value Date   HGBA1C 5.5 01/14/2013     Chemistry      Component Value Date/Time   NA 139 01/14/2013 1707   K 4.1 01/14/2013 1707   CL 107 01/14/2013 1707   CO2 22 01/14/2013 1707   BUN 5* 01/14/2013 1707   CREATININE 0.75 01/14/2013 1707   CREATININE 0.69 08/15/2012 0650      Component Value Date/Time   CALCIUM 9.1 01/14/2013 1707   ALKPHOS 78 01/14/2013 1707   AST 14 01/14/2013 1707   ALT 11 01/14/2013 1707   BILITOT 0.2* 01/14/2013 1707     Lab Results  Component Value Date   WBC 12.0* 01/14/2013   HGB 13.1 01/14/2013   HCT 38.9 01/14/2013   MCV 83.7 01/14/2013   PLT 356 01/14/2013    IMPRESSION AND PLAN:  Type II or unspecified type diabetes mellitus without mention of complication, not stated as uncontrolled Good control. Continue excellent diet and current med. Needs foot exam and urine microalb/cr at upcoming f/u visit in near future.  Acute pain of right knee This has been present < 6 mo and the onset was a traumatic blow to back of right leg, after which she has had  persistent pain and intermittent swelling. Exam today is suggestive of medial meniscus tear and possibly ACL tear.  Will check x-ray of right knee today. Will then get MRI to further eval for internal derangement.  COPD (chronic obstructive pulmonary disease) She describes frequent need/use of combivent, but it is not clear whether she is using this for just a feeling of phlegm in throat and the need to cough it up or if she is restricting use of it for just wheezing/sob episodes. I did give her albut neb sol'n and dispenses a home neb machine from office today.  However, I asked her to reserve her use of this med and her combivent for wheezing/SOB only, not just for cough/phlegm in throat. I will try to treat for poorly controlled GERD that may be contributing to her symptoms by switching her back to her prilosec 20mg  and taking this twice per day (d/c pantoprazole). Also rx'd allegra 180 to use with her daily nasacort to try to diminish PND as much as possible.   Her chest is clear/aeration is good today.  Will hold off on inhaled steroid at this time.  Lymphocytosis Mild leukocytosis for a couple of years on review of labs, but only recently with absolute lymphocytosis. As per pathologist's rec's, will repeat this today and if still lymphocytosis will ask hem/onc to evaluate her.  Contusion, hip Right hip, s/p fall out of her bed. This is improving appropriately over the last week since it occurred. Exam today showed minimal gluteal region tenderness.  No x-ray indicated. For pain mgmt (hip + right knee), will rx vicodin 5/325, 1-2 q6h prn, #40, no RF.  Therapeutic expectations and side effect profile of medication discussed today.  Patient's questions answered. She understands that this medication is a short term treatment.  Atrophic vaginitis Per pt request, I restarted her estrace vaginal cream.  Neurotic excoriations Improving.  Continue cutivate cream.   An After Visit Summary  was printed and given to the patient.  Spent 45 min with pt today, with >50% of this time spent in counseling and care coordination regarding the above problems.  FOLLOW UP: 2 wks

## 2013-01-28 NOTE — Assessment & Plan Note (Signed)
Right hip, s/p fall out of her bed. This is improving appropriately over the last week since it occurred. Exam today showed minimal gluteal region tenderness.  No x-ray indicated. For pain mgmt (hip + right knee), will rx vicodin 5/325, 1-2 q6h prn, #40, no RF.  Therapeutic expectations and side effect profile of medication discussed today.  Patient's questions answered. She understands that this medication is a short term treatment.

## 2013-01-28 NOTE — Assessment & Plan Note (Signed)
She describes frequent need/use of combivent, but it is not clear whether she is using this for just a feeling of phlegm in throat and the need to cough it up or if she is restricting use of it for just wheezing/sob episodes. I did give her albut neb sol'n and dispenses a home neb machine from office today.  However, I asked her to reserve her use of this med and her combivent for wheezing/SOB only, not just for cough/phlegm in throat. I will try to treat for poorly controlled GERD that may be contributing to her symptoms by switching her back to her prilosec 20mg  and taking this twice per day (d/c pantoprazole). Also rx'd allegra 180 to use with her daily nasacort to try to diminish PND as much as possible.   Her chest is clear/aeration is good today.  Will hold off on inhaled steroid at this time.

## 2013-01-28 NOTE — Assessment & Plan Note (Signed)
This has been present < 6 mo and the onset was a traumatic blow to back of right leg, after which she has had persistent pain and intermittent swelling. Exam today is suggestive of medial meniscus tear and possibly ACL tear.  Will check x-ray of right knee today. Will then get MRI to further eval for internal derangement.

## 2013-01-28 NOTE — Assessment & Plan Note (Signed)
Improving.  Continue cutivate cream.

## 2013-01-28 NOTE — Assessment & Plan Note (Signed)
Per pt request, I restarted her estrace vaginal cream.

## 2013-01-28 NOTE — Assessment & Plan Note (Addendum)
Good control. Continue excellent diet and current med. Needs foot exam and urine microalb/cr at upcoming f/u visit in near future.

## 2013-01-31 ENCOUNTER — Other Ambulatory Visit: Payer: Self-pay | Admitting: Family Medicine

## 2013-01-31 DIAGNOSIS — D72820 Lymphocytosis (symptomatic): Secondary | ICD-10-CM

## 2013-02-02 ENCOUNTER — Telehealth: Payer: Self-pay | Admitting: Hematology & Oncology

## 2013-02-02 ENCOUNTER — Telehealth: Payer: Self-pay | Admitting: *Deleted

## 2013-02-02 NOTE — Telephone Encounter (Signed)
Obtained Lyrica 225 mg 60/30 Prior Authorization via telephone w/[employer] at [phone]/SLS

## 2013-02-02 NOTE — Telephone Encounter (Signed)
Called pt to schedule appointment, no answer or voice mail

## 2013-02-04 ENCOUNTER — Telehealth: Payer: Self-pay | Admitting: Hematology & Oncology

## 2013-02-04 NOTE — Telephone Encounter (Signed)
Pt aware of 7-3 appointment °

## 2013-02-05 ENCOUNTER — Encounter: Payer: Self-pay | Admitting: *Deleted

## 2013-02-09 ENCOUNTER — Ambulatory Visit (HOSPITAL_BASED_OUTPATIENT_CLINIC_OR_DEPARTMENT_OTHER)
Admission: RE | Admit: 2013-02-09 | Discharge: 2013-02-09 | Disposition: A | Discharge status: Home / Self Care | Payer: Medicare Other | Source: Ambulatory Visit | Attending: Family Medicine | Admitting: Family Medicine

## 2013-02-09 DIAGNOSIS — M25561 Pain in right knee: Secondary | ICD-10-CM

## 2013-02-09 DIAGNOSIS — M1710 Unilateral primary osteoarthritis, unspecified knee: Secondary | ICD-10-CM | POA: Insufficient documentation

## 2013-02-09 DIAGNOSIS — M25569 Pain in unspecified knee: Secondary | ICD-10-CM | POA: Insufficient documentation

## 2013-02-09 DIAGNOSIS — M2240 Chondromalacia patellae, unspecified knee: Secondary | ICD-10-CM | POA: Insufficient documentation

## 2013-02-09 DIAGNOSIS — M25469 Effusion, unspecified knee: Secondary | ICD-10-CM | POA: Insufficient documentation

## 2013-02-09 DIAGNOSIS — IMO0002 Reserved for concepts with insufficient information to code with codable children: Secondary | ICD-10-CM | POA: Insufficient documentation

## 2013-02-09 DIAGNOSIS — M6740 Ganglion, unspecified site: Secondary | ICD-10-CM | POA: Insufficient documentation

## 2013-02-23 ENCOUNTER — Other Ambulatory Visit: Payer: Self-pay | Admitting: *Deleted

## 2013-02-23 ENCOUNTER — Telehealth: Payer: Self-pay | Admitting: Family Medicine

## 2013-02-23 ENCOUNTER — Other Ambulatory Visit: Payer: Self-pay | Admitting: Family Medicine

## 2013-02-23 DIAGNOSIS — M5010 Cervical disc disorder with radiculopathy, unspecified cervical region: Secondary | ICD-10-CM

## 2013-02-23 DIAGNOSIS — M6740 Ganglion, unspecified site: Secondary | ICD-10-CM

## 2013-02-23 DIAGNOSIS — G8929 Other chronic pain: Secondary | ICD-10-CM

## 2013-02-23 MED ORDER — HYDROCODONE-ACETAMINOPHEN 5-325 MG PO TABS: ORAL_TABLET | ORAL | Status: DC

## 2013-02-23 NOTE — Progress Notes (Signed)
Per MRI Knee results & provider instructions/SLS

## 2013-02-23 NOTE — Telephone Encounter (Signed)
Please advise refill for hydrocodone 5-325.  Patient only received 40 tabs sig was 1-2 q6hours for pain.

## 2013-02-23 NOTE — Telephone Encounter (Signed)
Closing this encounter to start new medication encounter due to provider having problem filling medication.

## 2013-02-23 NOTE — Telephone Encounter (Deleted)
Message copied by Regis Bill on Tue Feb 23, 2013  2:33 PM ------      Message from: Jeoffrey Massed      Created: Thu Feb 11, 2013  6:33 PM       Pls notify pt that she has a moderate amount of arthritis in her knee plus a large cyst right behind the knee.  I recommend orthopedic surgeon referral to see if anything surgical needs to be done for this.  If she agrees, pls enter order for referral to [facility], first available provider, dx is chronic right knee pain, ganglion cyst of popliteal fossa (right).-thx ------

## 2013-02-23 NOTE — Telephone Encounter (Signed)
Patient called for her MRI results.  I didn't see any results yet but she did just have it today.  Patient also wanted to see if she could get a refill on her hydrocodone.  She was given #40 on 01/28/13 sig said to take 1-2 pills q6hrs for pain.  Patient states she ran out today.  She is about 4 days early.  Can we still refill?

## 2013-02-23 NOTE — Telephone Encounter (Signed)
Faxed rx printed and signed by Maximino Sarin.

## 2013-02-26 ENCOUNTER — Encounter: Payer: Self-pay | Admitting: Gastroenterology

## 2013-03-01 ENCOUNTER — Ambulatory Visit (INDEPENDENT_AMBULATORY_CARE_PROVIDER_SITE_OTHER): Payer: Medicare Other | Admitting: Family Medicine

## 2013-03-01 ENCOUNTER — Encounter: Payer: Self-pay | Admitting: Family Medicine

## 2013-03-01 VITALS — BP 162/88 | HR 70 | Temp 97.7°F | Resp 16 | Ht 64.0 in | Wt 184.0 lb

## 2013-03-01 DIAGNOSIS — E119 Type 2 diabetes mellitus without complications: Secondary | ICD-10-CM

## 2013-03-01 DIAGNOSIS — Z7689 Persons encountering health services in other specified circumstances: Secondary | ICD-10-CM

## 2013-03-01 DIAGNOSIS — F411 Generalized anxiety disorder: Secondary | ICD-10-CM

## 2013-03-01 DIAGNOSIS — M5412 Radiculopathy, cervical region: Secondary | ICD-10-CM

## 2013-03-01 DIAGNOSIS — Z8659 Personal history of other mental and behavioral disorders: Secondary | ICD-10-CM

## 2013-03-01 DIAGNOSIS — IMO0001 Reserved for inherently not codable concepts without codable children: Secondary | ICD-10-CM

## 2013-03-01 DIAGNOSIS — I10 Essential (primary) hypertension: Secondary | ICD-10-CM

## 2013-03-01 DIAGNOSIS — Z79899 Other long term (current) drug therapy: Secondary | ICD-10-CM

## 2013-03-01 DIAGNOSIS — G40909 Epilepsy, unspecified, not intractable, without status epilepticus: Secondary | ICD-10-CM

## 2013-03-01 DIAGNOSIS — R569 Unspecified convulsions: Secondary | ICD-10-CM

## 2013-03-01 DIAGNOSIS — Z7189 Other specified counseling: Secondary | ICD-10-CM

## 2013-03-01 DIAGNOSIS — M5010 Cervical disc disorder with radiculopathy, unspecified cervical region: Secondary | ICD-10-CM

## 2013-03-01 LAB — MICROALBUMIN / CREATININE URINE RATIO: Creatinine,U: 37.4 mg/dL

## 2013-03-01 MED ORDER — NEBIVOLOL HCL 20 MG PO TABS: ORAL_TABLET | ORAL | Status: DC

## 2013-03-01 MED ORDER — CLONAZEPAM 1 MG PO TABS: 1.0000 mg | ORAL_TABLET | Freq: Two times a day (BID) | ORAL | Status: DC | PRN

## 2013-03-01 NOTE — Progress Notes (Signed)
OFFICE NOTE  03/01/2013  CC:  Chief Complaint  Patient presents with  . Hypertension  . Diabetes     HPI: Patient is a 67 y.o. Caucasian female who is here for 1 mo DM 2 and HTN f/u. Home bp's avg 160 syst, 80s diast.  Compliant with meds.   Says home glucoses are "good" but can't really give specific numbers. Says she has more and more frequent feeling of diffuse numbness in both feet, R>L.  No pain in feet.  Says she is requiring albuterol neb q6h but each time I see her here in the office she is breathing well and I wonder if she is overtaking this med.  She has a hematology appt set for 03/04/13 with Dr. Myna Hidalgo for her longstanding mild leukocytosis. She has appt with ortho for her right knee problems 03/09/13.  Regarding her skin and hx of neurotic excoriations, she is much improved, continues to use cutivate prn. She asks if I'll assume responsibility for rx'ing her psych meds until she can re-establish with MH.  Says she has been out of her klonopin for 2 wks.  ROS: denies CP, palpitations, HAs, or dizziness.  Pertinent PMH:  Past Medical History  Diagnosis Date  . Obesity   . Allergic   . Seizure   . Skin cancer   . Asthma   . Depression   . GERD (gastroesophageal reflux disease)   . Hypertension   . Tobacco user   . CAD (coronary artery disease)     Moderate disease of the left circumflex managed medically  . Fibromyalgia   . Chronic pain   . Dyslipidemia   . Diverticul disease small and large intestine, no perforati or abscess   . Gastroparesis   . Overactive bladder   . Anxiety   . Hyperlipidemia   . Urge and stress incontinence   . Chronic cystitis   . Rectal prolapse    Past surgical, social, and family history reviewed and no changes noted since last office visit.  MEDS:  Outpatient Prescriptions Prior to Visit  Medication Sig Dispense Refill  . albuterol (PROVENTIL) (2.5 MG/3ML) 0.083% nebulizer solution Take 3 mLs (2.5 mg total) by nebulization  every 6 (six) hours as needed for wheezing.  75 mL  1  . albuterol-ipratropium (COMBIVENT) 18-103 MCG/ACT inhaler Inhale 2 puffs into the lungs every 6 (six) hours as needed for wheezing.  1 Inhaler  1  . Alum & Mag Hydroxide-Simeth (MAGIC MOUTHWASH W/LIDOCAINE) SOLN 1 tsp po swish/gargle and spit four times per day as needed for mouth pain  200 mL  5  . amLODipine (NORVASC) 10 MG tablet Take 1 tablet (10 mg total) by mouth daily. For hypertension.  30 tablet  3  . aspirin 81 MG EC tablet Take 1 tablet (81 mg total) by mouth daily. For platelette aggregation.  30 tablet  0  . benazepril (LOTENSIN) 20 MG tablet Take 1 tablet (20 mg total) by mouth daily. For hypertension.  30 tablet  3  . clonazePAM (KLONOPIN) 1 MG tablet Take 1 tablet (1 mg total) by mouth 2 (two) times daily as needed. anxiety  60 tablet  0  . diclofenac sodium (VOLTAREN) 1 % GEL Apply 2 g topically as needed.  100 g  3  . DULoxetine (CYMBALTA) 60 MG capsule Take 1 capsule (60 mg total) by mouth daily. For anxiety and depression.  30 capsule  3  . estradiol (ESTRACE) 0.1 MG/GM vaginal cream 0.25 applicators full qd  42.5 g  12  . fexofenadine (ALLEGRA) 180 MG tablet Take 1 tablet (180 mg total) by mouth daily.  30 tablet  6  . fish oil-omega-3 fatty acids 1000 MG capsule Take 2 capsules (2 g total) by mouth daily. For lipid control.      . fluticasone (CUTIVATE) 0.05 % cream Apply to affected areas of skin twice daily as needed  60 g  1  . gabapentin (NEURONTIN) 100 MG capsule Take 2 capsules (200 mg total) by mouth at bedtime.  60 capsule  3  . glimepiride (AMARYL) 2 MG tablet Take 1 tablet (2 mg total) by mouth daily before breakfast.  30 tablet  3  . glycopyrrolate (ROBINUL) 2 MG tablet Take 1 tablet (2 mg total) by mouth 3 (three) times daily as needed.  90 tablet  3  . HYDROcodone-acetaminophen (NORCO/VICODIN) 5-325 MG per tablet 1-2 tabs po q6h prn pain  40 tablet  0  . lidocaine (LIDODERM) 5 % Place 3 patches onto the skin.  Remove & Discard patch within 12 hours or as directed by MD      . methocarbamol (ROBAXIN) 500 MG tablet Take 1 tablet (500 mg total) by mouth 3 (three) times daily.  90 tablet  3  . nebivolol (BYSTOLIC) 10 MG tablet Take 1 tablet (10 mg total) by mouth daily.  30 tablet  3  . nitroGLYCERIN (NITROSTAT) 0.4 MG SL tablet Place 1 tablet (0.4 mg total) under the tongue every 5 (five) minutes as needed for chest pain.  90 tablet  3  . omeprazole (PRILOSEC) 20 MG capsule Take 1 capsule (20 mg total) by mouth 2 (two) times daily.  60 capsule  3  . Oxybutynin Chloride (GELNIQUE) 10 % GEL Place 1 application onto the skin daily.  1 g  3  . pantoprazole (PROTONIX) 40 MG tablet Take 1 tablet (40 mg total) by mouth 2 (two) times daily. For reflux.      . pravastatin (PRAVACHOL) 80 MG tablet Take 80 mg by mouth daily.      . pregabalin (LYRICA) 225 MG capsule Take 1 capsule (225 mg total) by mouth 2 (two) times daily.  60 capsule  3  . TEGRETOL 200 MG tablet Take 1 tablet (200 mg total) by mouth 3 (three) times daily. TEGRETOL BRAND NAME ONLY  90 tablet  3  . triamcinolone (NASACORT) 55 MCG/ACT nasal inhaler Place 2 sprays into the nose daily. For allergies.  1 Inhaler  3   No facility-administered medications prior to visit.    PE: Blood pressure 162/88, pulse 70, temperature 97.7 F (36.5 C), temperature source Oral, resp. rate 16, height 5\' 4"  (1.626 m), weight 184 lb (83.462 kg), SpO2 97.00%. Gen: Alert, well appearing.  Patient is oriented to person, place, time, and situation. CV: RRR, no m/r/g.   LUNGS: CTA bilat, nonlabored resps, good aeration in all lung fields. Foot exam - both normal; no swelling, tenderness or skin or vascular lesions. Color and temperature is normal. Sensation is intact. Peripheral pulses are palpable. Toenails are normal.   IMPRESSION AND PLAN:  HYPERTENSION Not ideally controlled. Increase bystolic to TWO of the 10mg  tabs qd. Continue amlodipine and benazepril at  current doses. Continue home monitoring.  Type II or unspecified type diabetes mellitus without mention of complication, not stated as uncontrolled Foot exam normal today. Her A1c was 5.5% about 6 wks ago. Needs screening eye exam--reminded pt today.  She has an ophthalmologist already. Check urine microalb/cr today.  ANXIETY I did restart klonopin and told her I would RF her psych meds as the need arises until she can re-establish with Mental Health. Seems pretty stable currently: carries dx of bipolar d/o according to chart but currently is only on antidepressant and benzo.   An After Visit Summary was printed and given to the patient.  FOLLOW UP: 19mo, mainly to focus on COPD mgmt

## 2013-03-04 ENCOUNTER — Other Ambulatory Visit (HOSPITAL_BASED_OUTPATIENT_CLINIC_OR_DEPARTMENT_OTHER): Payer: Medicare Other | Admitting: Lab

## 2013-03-04 ENCOUNTER — Ambulatory Visit: Payer: Medicare Other

## 2013-03-04 ENCOUNTER — Other Ambulatory Visit (HOSPITAL_COMMUNITY)
Admission: RE | Admit: 2013-03-04 | Discharge: 2013-03-04 | Disposition: A | Discharge status: Home / Self Care | Payer: Medicare Other | Source: Ambulatory Visit | Attending: Hematology & Oncology | Admitting: Hematology & Oncology

## 2013-03-04 ENCOUNTER — Ambulatory Visit (HOSPITAL_BASED_OUTPATIENT_CLINIC_OR_DEPARTMENT_OTHER): Payer: Medicare Other | Admitting: Hematology & Oncology

## 2013-03-04 VITALS — BP 128/60 | HR 71 | Temp 97.8°F | Resp 16 | Ht 64.0 in | Wt 183.0 lb

## 2013-03-04 DIAGNOSIS — D72820 Lymphocytosis (symptomatic): Secondary | ICD-10-CM

## 2013-03-04 LAB — CBC WITH DIFFERENTIAL (CANCER CENTER ONLY)
BASO#: 0.1 10*3/uL (ref 0.0–0.2)
BASO%: 0.8 % (ref 0.0–2.0)
Eosinophils Absolute: 0.3 10*3/uL (ref 0.0–0.5)
HCT: 41.1 % (ref 34.8–46.6)
HGB: 14.1 g/dL (ref 11.6–15.9)
LYMPH%: 38.4 % (ref 14.0–48.0)
MCH: 29.2 pg (ref 26.0–34.0)
MCHC: 34.3 g/dL (ref 32.0–36.0)
MCV: 85 fL (ref 81–101)
MONO%: 6.6 % (ref 0.0–13.0)
NEUT#: 5.6 10*3/uL (ref 1.5–6.5)
NEUT%: 51.1 % (ref 39.6–80.0)
Platelets: 314 10*3/uL (ref 145–400)
RBC: 4.83 10*6/uL (ref 3.70–5.32)

## 2013-03-04 NOTE — Progress Notes (Signed)
This office note has been dictated.

## 2013-03-05 NOTE — Progress Notes (Signed)
CC:   Jeoffrey Massed, MD  DIAGNOSIS:  Mild leukocytosis.  HISTORY OF PRESENT ILLNESS:  [name] is a very charming 67 year old white female.  She has quite a few medical problems.  She is followed by Nicoletta Ba.  She is supposed to have surgery for a Baker's cyst behind the right knee.  I am not sure who is going to be doing this.  The patient has had neck surgery.  She has had a hysterectomy already.  She is bothered by a lot of pain issues.  She has neck pain.  She has back pain.  The patient also has diabetes.  She is not insulin- dependent.  She is followed with lab work routinely.  She was noted to have some leukocytosis 2 years ago.  Back in May of 2012, her white count was 11.3 with a hemoglobin 13 and platelet count 262.  White cell differential at that time was 57 segs, 35 lymphocytes, 5 monos.  On July 2013, her white cell count was 12.2.  Recently, in May of 2014, her CBC showed a white cell count of 14.3, hemoglobin 12.5, hematocrit 37.8, platelet count 375.  Her white cell differential showed 56 segs, 35 lymphocytes, 6 monos.  She was kindly referred to the [facility] for an evaluation.  The patient is still smoking.  She is trying to smoke more electronic cigarettes.  She does have some gastroparesis.  She does have history of incontinence secondary to vaginocele and rectocele.  She apparently had this repaired.  She is on quite a few medications.  PAST MEDICAL HISTORY:  Remarkable for: 1. Hypertension. 2. GERD. 3. Depression. 4. Coronary artery disease. 5. Fibromyalgia. 6. Hyperlipidemia. 7. Noninsulin-dependent diabetes. 8. Rectocele/vaginocele. 9. Cholecystectomy. 10.Hysterectomy. 11.Cervical spine fusion. 12.Chronic cystitis. 13.Diverticulosis. 14.Asthma. 15.History of seizures.  ALLERGIES: 1. Januvia. 2. Metformin. 3. Reglan. 4. Penicillin. 5. Sulfas. 6. Latex.  MEDICATIONS: 1. Proventil nebulizer 2.5 mg  q.6 hours p.r.n. 2. Combivent inhaler (18/103 mcg) 2 puffs q.6 hours p.r.n. 3. Norvasc 10 mg p.o. daily. 4. Aspirin 81 mg p.o. daily. 5. Lotensin 20 mg p.o. daily. 6. Klonopin 1 mg p.o. b.i.d. p.r.n. 7. Voltaren gel applied topically. 8. Cymbalta 60 mg p.o. daily. 9. Estradiol 0.1 mg vaginal cream daily. 10.Allegra 180 mg p.o. daily. 11.Neurontin 3 mg p.o. at bedtime. 12.Amaryl 2 mg p.o. q.a.c. 13.Robinul 2 mg p.o. t.i.d. p.r.n. 14.Vicodin (5/325) 1-2 p.o. q.4-6 hours p.r.n. 15.Bystolic 20 mg p.o. daily. 16.Oxybutynin gel daily. 17.Protonix 40 mg p.o. b.i.d. 18.Pravachol 80 mg p.o. daily. 19.Lyrica 25 mg p.o. b.i.d. 20.Lidoderm patches as needed. 21.Robaxin 500 mg p.o. t.i.d.  SOCIAL HISTORY:  Remarkable for tobacco use.  She has about a 40 pack year history of tobacco use.  There is no significant alcohol use.  FAMILY HISTORY:  Noncontributory.  REVIEW OF SYSTEMS:  As stated in history present illness.  Mostly, she has problems with pain.  PHYSICAL EXAMINATION:  General:  This is a fairly well-developed, well- nourished white female in no obvious distress.  Vital signs: Temperature of 97.8, pulse 71, respiratory rate 16, blood pressure 128/60, weight is 183.  Head and neck exam:  Shows a normocephalic, atraumatic skull.  There are no ocular or oral lesions.  There are no palpable cervical or supraclavicular lymph nodes.  Lungs:  Clear bilaterally.  Cardiac exam:  Regular rate and rhythm with a normal S1 and S2.  There are no murmurs, rubs or bruits.  Abdominal exam:  Soft with good bowel  sounds.  There is no palpable abdominal mass.  There is no fluid wave.  There is no palpable hepatosplenomegaly.  Back exam: Shows a surgical scar in the cervical spine.  There is no tenderness over the spine, ribs or hips.  Extremities shows the Baker's cyst behind the right knee.  She has decent range of motion of her joints.  Skin exam:  Shows no rashes, ecchymoses or petechia.   Neurological exam: Shows no focal neurological deficits.  LABORATORY STUDIES:  White cell count 11, hemoglobin 14.1, hematocrit 41.1, platelet count 314.  MCV is 85.  White cell differential shows 51 segs, 38 lymphs, 7 monos, 3 eosinophils.  Peripheral blood smear shows a normochromic, normocytic population of red blood cells.  There is no rouleaux formation.  There are no schistocytes or spherocytes.  I see no nucleated red blood cells.  White cells appear normal in morphology and maturation.  There is no hypersegmented polys.  There are no immature myeloid lymphoid cells. There are no atypical lymphocytes.  Platelets are adequate in number and size.  IMPRESSION:  [name] is a very nice 67 year old white female with chronic leukocytosis.  This is very mild.  I really have to believe that this is going to be reactive.  What I would like is that she is not anemic or thrombocytopenic.  In addition, she has normal ratio of her white cell, subtypes.  Sometimes we do see women who are smokers with a slightly elevated white cell count.  I do not believe that she has any bone marrow disorder.  She does not need to have a bone marrow biopsy done.  She also has a lot of pain issues and other health issues.  She has inflammatory issues.  All of these I think are probably have been contributing to the mild leukocytosis.  Her exam is fairly unremarkable.  There is no lymphadenopathy.  There is no palpable hepatosplenomegaly.  I see no rashes. We had good fellowship.  [name] was very faithful and she has a strong faith.  We pray together.  I gave her a prayer blanket, which she very much appreciated.  As nice as [name] is, I do not think we have to see her back in the office, as we really are not contributing to her medical care.  I spent a good hour or so with her today.  Again, we had a good prayer session.    ______________________________ Josph Macho,  M.D. PRE/MEDQ  D:  03/04/2013  T:  03/05/2013  Job:  [id number]

## 2013-03-08 ENCOUNTER — Other Ambulatory Visit: Payer: Self-pay | Admitting: Family Medicine

## 2013-03-08 ENCOUNTER — Telehealth: Payer: Self-pay | Admitting: Family Medicine

## 2013-03-08 NOTE — Telephone Encounter (Signed)
Pharmacy request.   Patient states she is supposed to be taking bystolic BID instead of QD.  Please advise.  I didn't see in notes that it was ever written BID

## 2013-03-08 NOTE — Telephone Encounter (Signed)
Also got two other faxes from pharmacy stating patient insurance wont cover methocarbamol 50mg , they prefer tizanidine.   Patient states you increased dose of glimepiride 2mg  to 1.5 tabs daily. We need to send new script so insurance will cover. Patient wants [place] to contact her insurance to have them pay for magic mouth wash.   Please advise refills / med changes.  Thanks

## 2013-03-08 NOTE — Telephone Encounter (Signed)
Denied refill.  Patient should have enough to get her until her next appt 03/29/13.

## 2013-03-09 ENCOUNTER — Encounter: Payer: Self-pay | Admitting: Family Medicine

## 2013-03-09 MED ORDER — HYDROCODONE-ACETAMINOPHEN 5-325 MG PO TABS: ORAL_TABLET | ORAL | Status: DC

## 2013-03-10 LAB — FLOW CYTOMETRY - CHCC SATELLITE

## 2013-03-11 ENCOUNTER — Other Ambulatory Visit: Payer: Self-pay

## 2013-03-12 ENCOUNTER — Telehealth: Payer: Self-pay | Admitting: Oncology

## 2013-03-12 NOTE — Telephone Encounter (Addendum)
Message copied by Lacie Draft on Fri Mar 12, 2013  5:02 PM ------      Message from: Josph Macho      Created: Tue Mar 09, 2013  1:54 PM       Call - labs are ok!!  No evidence of a bone marrow problem!!  Loyal Jacobson with patient. ------

## 2013-03-16 ENCOUNTER — Telehealth: Payer: Self-pay | Admitting: Oncology

## 2013-03-16 NOTE — Telephone Encounter (Signed)
Message copied by Lacie Draft on Tue Mar 16, 2013  4:08 PM ------      Message from: [name]      Created: Sat Mar 13, 2013 12:17 PM       Call - tests do NOT show any leukemia!!!!  Cindee Lame ------

## 2013-03-17 ENCOUNTER — Telehealth: Payer: Self-pay | Admitting: Oncology

## 2013-03-17 ENCOUNTER — Encounter: Payer: Self-pay | Admitting: Family Medicine

## 2013-03-17 ENCOUNTER — Telehealth: Payer: Self-pay | Admitting: Family Medicine

## 2013-03-17 NOTE — Telephone Encounter (Signed)
Last I check with her she had good sugar control.  Pls call pt and ask her what her fasting sugars and what her sugars 2 hours after a meal have been, then I can suggest a dose change if appropriate.--thx

## 2013-03-17 NOTE — Assessment & Plan Note (Signed)
Not ideally controlled. Increase bystolic to TWO of the 10mg  tabs qd. Continue amlodipine and benazepril at current doses. Continue home monitoring.

## 2013-03-17 NOTE — Telephone Encounter (Addendum)
Message copied by Lacie Draft on Wed Mar 17, 2013  4:18 PM ------      Message from: Josph Macho      Created: Sat Mar 13, 2013 12:17 PM       Call - tests do NOT show any leukemia!!!!  Cindee Lame ------Spoke with patient regarding good news.

## 2013-03-17 NOTE — Assessment & Plan Note (Signed)
Foot exam normal today. Her A1c was 5.5% about 6 wks ago. Needs screening eye exam--reminded pt today.  She has an ophthalmologist already. Check urine microalb/cr today.

## 2013-03-17 NOTE — Telephone Encounter (Signed)
Pt pharmacy request that pt is requesting a stronger strength of glimepiride.  Pt is apparently taking more than prescribed.  She is currently prescribed glimepiride 2mg 1 tab QD before breakfast.  Please advise.  

## 2013-03-17 NOTE — Telephone Encounter (Addendum)
Message copied by Lacie Draft on Wed Mar 17, 2013  4:15 PM ------      Message from: Josph Macho      Created: Sat Mar 13, 2013 12:17 PM       Call - tests do NOT show any leukemia!!!!  Cindee Lame ------Spoke with pt about good news.

## 2013-03-17 NOTE — Assessment & Plan Note (Addendum)
I did restart klonopin and told her I would RF her psych meds as the need arises until she can re-establish with Mental Health. Seems pretty stable currently: carries dx of bipolar d/o + anxiety d/o.

## 2013-03-17 NOTE — Telephone Encounter (Signed)
Pt pharmacy request that pt is requesting a stronger strength of glimepiride.  Pt is apparently taking more than prescribed.  She is currently prescribed glimepiride 2mg  1 tab QD before breakfast.  Please advise.

## 2013-03-18 NOTE — Telephone Encounter (Signed)
Unable to reach patient.  No answer.  Will try again.

## 2013-03-20 ENCOUNTER — Encounter: Payer: Self-pay | Admitting: Family Medicine

## 2013-03-23 ENCOUNTER — Other Ambulatory Visit: Payer: Self-pay | Admitting: Family Medicine

## 2013-03-23 DIAGNOSIS — IMO0001 Reserved for inherently not codable concepts without codable children: Secondary | ICD-10-CM

## 2013-03-23 DIAGNOSIS — Z7689 Persons encountering health services in other specified circumstances: Secondary | ICD-10-CM

## 2013-03-23 DIAGNOSIS — R569 Unspecified convulsions: Secondary | ICD-10-CM

## 2013-03-23 DIAGNOSIS — G40909 Epilepsy, unspecified, not intractable, without status epilepticus: Secondary | ICD-10-CM

## 2013-03-23 DIAGNOSIS — Z8659 Personal history of other mental and behavioral disorders: Secondary | ICD-10-CM

## 2013-03-23 DIAGNOSIS — I10 Essential (primary) hypertension: Secondary | ICD-10-CM

## 2013-03-23 DIAGNOSIS — Z79899 Other long term (current) drug therapy: Secondary | ICD-10-CM

## 2013-03-23 MED ORDER — GLIMEPIRIDE 2 MG PO TABS: ORAL_TABLET | ORAL | Status: DC

## 2013-03-23 NOTE — Progress Notes (Signed)
Pls notify pt that we sent in new amaryl (glimeperide) rx : 1-2 tabs po qAM.--thx

## 2013-03-29 ENCOUNTER — Ambulatory Visit: Payer: Self-pay | Admitting: Family Medicine

## 2013-03-31 ENCOUNTER — Ambulatory Visit (INDEPENDENT_AMBULATORY_CARE_PROVIDER_SITE_OTHER): Payer: Medicare Other | Admitting: Family Medicine

## 2013-03-31 ENCOUNTER — Encounter: Payer: Self-pay | Admitting: Family Medicine

## 2013-03-31 VITALS — BP 126/72 | HR 72 | Temp 97.6°F | Resp 16 | Ht 64.0 in | Wt 186.0 lb

## 2013-03-31 DIAGNOSIS — J449 Chronic obstructive pulmonary disease, unspecified: Secondary | ICD-10-CM

## 2013-03-31 DIAGNOSIS — J45909 Unspecified asthma, uncomplicated: Secondary | ICD-10-CM

## 2013-03-31 MED ORDER — OXYCODONE-ACETAMINOPHEN 5-325 MG PO TABS: ORAL_TABLET | ORAL | Status: DC

## 2013-03-31 NOTE — Progress Notes (Addendum)
OFFICE NOTE  03/31/2013  CC:  Chief Complaint  Patient presents with  . COPD     HPI: Patient is a 67 y.o. Caucasian female who is here for f/u lung problems. Dx in chart not exactly clear: both COPD and asthma are charted.  Per pt she has had asthma since age 16. Also has extensive tobacco abuse.  Says PFT's were done in [place]--she cannot recall date or place or result.    Of note: Says her psychiatrist (Dr Sheran Luz) rx'd tegretol for her but this made her HA's worse so she d/c'd it. She was rx'd percocet from her orthopedic office due to right knee pain b/c vicodin was not helpful enough and it caused nausea.  I recently rx'd her zanaflex for hs use as muscle relaxer and so far this is helping.  Pertinent PMH:  Past Medical History  Diagnosis Date  . Obesity   . Bipolar disorder   . Seizure   . Skin cancer   . Asthma   . Depression   . GERD (gastroesophageal reflux disease)   . Hypertension   . Tobacco user   . CAD (coronary artery disease)     Moderate disease of the left circumflex managed medically  . Fibromyalgia   . Chronic pain   . Dyslipidemia   . Diverticul disease small and large intestine, no perforati or abscess   . Gastroparesis   . Overactive bladder   . Anxiety   . Hyperlipidemia   . Urge and stress incontinence   . Chronic cystitis   . Rectal prolapse   . Neurotic excoriations 01/28/2013  . Leukocytosis     chronic, mild, s/p eval 03/2013 by hematologist --reassured; likely secondary to ongoing smoking   Past surgical, social, and family history reviewed and no changes noted since last office visit.  MEDS:  Outpatient Prescriptions Prior to Visit  Medication Sig Dispense Refill  . albuterol (PROVENTIL) (2.5 MG/3ML) 0.083% nebulizer solution Take 3 mLs (2.5 mg total) by nebulization every 6 (six) hours as needed for wheezing.  75 mL  1  . albuterol-ipratropium (COMBIVENT) 18-103 MCG/ACT inhaler Inhale 2 puffs into the lungs every 6 (six)  hours as needed for wheezing.  1 Inhaler  1  . Alum & Mag Hydroxide-Simeth (MAGIC MOUTHWASH W/LIDOCAINE) SOLN 1 tsp po swish/gargle and spit four times per day as needed for mouth pain  200 mL  5  . amLODipine (NORVASC) 10 MG tablet Take 1 tablet (10 mg total) by mouth daily. For hypertension.  30 tablet  3  . aspirin 81 MG EC tablet Take 1 tablet (81 mg total) by mouth daily. For platelette aggregation.  30 tablet  0  . benazepril (LOTENSIN) 20 MG tablet Take 1 tablet (20 mg total) by mouth daily. For hypertension.  30 tablet  3  . clonazePAM (KLONOPIN) 1 MG tablet Take 1 tablet (1 mg total) by mouth 2 (two) times daily as needed. anxiety  60 tablet  3  . diclofenac sodium (VOLTAREN) 1 % GEL Apply 2 g topically as needed.  100 g  3  . DULoxetine (CYMBALTA) 60 MG capsule Take 1 capsule (60 mg total) by mouth daily. For anxiety and depression.  30 capsule  3  . estradiol (ESTRACE) 0.1 MG/GM vaginal cream 0.25 applicators full qd  42.5 g  12  . fexofenadine (ALLEGRA) 180 MG tablet Take 1 tablet (180 mg total) by mouth daily.  30 tablet  6  . fish oil-omega-3 fatty acids 1000  MG capsule Take 2 capsules (2 g total) by mouth daily. For lipid control.      . fluticasone (CUTIVATE) 0.05 % cream Apply to affected areas of skin twice daily as needed  60 g  1  . gabapentin (NEURONTIN) 100 MG capsule Take 2 capsules (200 mg total) by mouth at bedtime.  60 capsule  3  . glimepiride (AMARYL) 2 MG tablet May take 1 to 1 tabs po qAM for diabetes  60 tablet  3  . glycopyrrolate (ROBINUL) 2 MG tablet Take 1 tablet (2 mg total) by mouth 3 (three) times daily as needed.  90 tablet  3  . HYDROcodone-acetaminophen (NORCO/VICODIN) 5-325 MG per tablet 1-2 tabs po q6h prn pain  40 tablet  0  . lidocaine (LIDODERM) 5 % Place 3 patches onto the skin. Remove & Discard patch within 12 hours or as directed by MD      . Nebivolol HCl (BYSTOLIC) 20 MG TABS 1 tab po qd  30 tablet  6  . nitroGLYCERIN (NITROSTAT) 0.4 MG SL tablet  Place 1 tablet (0.4 mg total) under the tongue every 5 (five) minutes as needed for chest pain.  90 tablet  3  . omeprazole (PRILOSEC) 20 MG capsule Take 1 capsule (20 mg total) by mouth 2 (two) times daily.  60 capsule  3  . Oxybutynin Chloride (GELNIQUE) 10 % GEL Place 1 application onto the skin daily.  1 g  3  . pravastatin (PRAVACHOL) 80 MG tablet Take 80 mg by mouth daily.      . pregabalin (LYRICA) 225 MG capsule Take 1 capsule (225 mg total) by mouth 2 (two) times daily.  60 capsule  3  . triamcinolone (NASACORT) 55 MCG/ACT nasal inhaler Place 2 sprays into the nose daily. For allergies.  1 Inhaler  3  . methocarbamol (ROBAXIN) 500 MG tablet Take 1 tablet (500 mg total) by mouth 3 (three) times daily.  90 tablet  3  . pantoprazole (PROTONIX) 40 MG tablet Take 1 tablet (40 mg total) by mouth 2 (two) times daily. For reflux.       No facility-administered medications prior to visit.    PE: Blood pressure 126/72, pulse 72, temperature 97.6 F (36.4 C), temperature source Temporal, resp. rate 16, height 5\' 4"  (1.626 m), weight 186 lb (84.369 kg), SpO2 94.00%. Gen: Alert, well appearing.  Patient is oriented to person, place, time, and situation. AFFECT: pleasant, lucid thought and speech. CV: RRR, distant S1 and S2.  No m/r/g LUNGS: diminished breath sounds diffusely, without wheezing or prolongation of expiratory phase.  Nonlabored resps.    IMPRESSION AND PLAN:  1) Obstructive lung dz: would like to get some objective evidence as baseline.  She is agreeable to getting PFTs at [facility].   Start Advair after PFTs completed.  Continue prn albuterol for wheezing/SOB/persistent dry coughing.    2) Right knee pain: seeing ortho for this.  I agreed to continue her rx for percocet 5/325, 2 tabs po bid prn, #120.  Therapeutic expectations and side effect profile of medication discussed today.  Patient's questions answered. This will not be a long term thing with her--at least not  managed by myself.  Spent 30 min with pt today, with >50% of this time spent in counseling and care coordination regarding the above problems.  FOLLOW UP:  1 mo f/u pulm probs

## 2013-04-07 ENCOUNTER — Inpatient Hospital Stay (HOSPITAL_COMMUNITY): Admission: RE | Admit: 2013-04-07 | Payer: Self-pay | Source: Ambulatory Visit

## 2013-04-07 ENCOUNTER — Telehealth: Payer: Self-pay | Admitting: Family Medicine

## 2013-04-07 NOTE — Telephone Encounter (Signed)
Patient would like to get a prescription for diabetic shoes sent to [facility].

## 2013-04-08 ENCOUNTER — Other Ambulatory Visit: Payer: Self-pay | Admitting: Family Medicine

## 2013-04-08 ENCOUNTER — Encounter: Payer: Self-pay | Admitting: Family Medicine

## 2013-04-08 MED ORDER — ALBUTEROL SULFATE (2.5 MG/3ML) 0.083% IN NEBU: 2.5000 mg | INHALATION_SOLUTION | Freq: Four times a day (QID) | RESPIRATORY_TRACT | Status: DC | PRN

## 2013-04-08 NOTE — Telephone Encounter (Signed)
Will place written rx on your desk to fax.-thx

## 2013-04-09 NOTE — Telephone Encounter (Signed)
rx faxed to pharmacy

## 2013-04-22 ENCOUNTER — Other Ambulatory Visit: Payer: Self-pay | Admitting: *Deleted

## 2013-04-22 DIAGNOSIS — Z8659 Personal history of other mental and behavioral disorders: Secondary | ICD-10-CM

## 2013-04-22 DIAGNOSIS — I10 Essential (primary) hypertension: Secondary | ICD-10-CM

## 2013-04-22 DIAGNOSIS — R569 Unspecified convulsions: Secondary | ICD-10-CM

## 2013-04-22 DIAGNOSIS — Z7689 Persons encountering health services in other specified circumstances: Secondary | ICD-10-CM

## 2013-04-22 DIAGNOSIS — Z79899 Other long term (current) drug therapy: Secondary | ICD-10-CM

## 2013-04-22 DIAGNOSIS — G40909 Epilepsy, unspecified, not intractable, without status epilepticus: Secondary | ICD-10-CM

## 2013-04-22 MED ORDER — GLYCOPYRROLATE 2 MG PO TABS: 2.0000 mg | ORAL_TABLET | Freq: Three times a day (TID) | ORAL | Status: DC | PRN

## 2013-04-30 ENCOUNTER — Ambulatory Visit (INDEPENDENT_AMBULATORY_CARE_PROVIDER_SITE_OTHER): Payer: Medicare Other | Admitting: Family Medicine

## 2013-04-30 ENCOUNTER — Encounter: Payer: Self-pay | Admitting: Family Medicine

## 2013-04-30 VITALS — BP 111/75 | HR 67 | Temp 98.0°F | Resp 18 | Ht 64.0 in | Wt 189.0 lb

## 2013-04-30 DIAGNOSIS — G40909 Epilepsy, unspecified, not intractable, without status epilepticus: Secondary | ICD-10-CM

## 2013-04-30 DIAGNOSIS — Z8659 Personal history of other mental and behavioral disorders: Secondary | ICD-10-CM

## 2013-04-30 DIAGNOSIS — I10 Essential (primary) hypertension: Secondary | ICD-10-CM

## 2013-04-30 DIAGNOSIS — Z23 Encounter for immunization: Secondary | ICD-10-CM

## 2013-04-30 DIAGNOSIS — R569 Unspecified convulsions: Secondary | ICD-10-CM

## 2013-04-30 DIAGNOSIS — J449 Chronic obstructive pulmonary disease, unspecified: Secondary | ICD-10-CM

## 2013-04-30 DIAGNOSIS — Z7189 Other specified counseling: Secondary | ICD-10-CM

## 2013-04-30 DIAGNOSIS — Z79899 Other long term (current) drug therapy: Secondary | ICD-10-CM

## 2013-04-30 DIAGNOSIS — Z7689 Persons encountering health services in other specified circumstances: Secondary | ICD-10-CM

## 2013-04-30 DIAGNOSIS — M25561 Pain in right knee: Secondary | ICD-10-CM

## 2013-04-30 DIAGNOSIS — M25569 Pain in unspecified knee: Secondary | ICD-10-CM

## 2013-04-30 LAB — BASIC METABOLIC PANEL
BUN: 6 mg/dL (ref 6–23)
CO2: 24 mEq/L (ref 19–32)
Calcium: 9.2 mg/dL (ref 8.4–10.5)
Glucose, Bld: 90 mg/dL (ref 70–99)
Sodium: 135 mEq/L (ref 135–145)

## 2013-04-30 MED ORDER — GABAPENTIN 100 MG PO CAPS: 200.0000 mg | ORAL_CAPSULE | Freq: Every day | ORAL | Status: DC

## 2013-04-30 MED ORDER — OXYCODONE-ACETAMINOPHEN 5-325 MG PO TABS: ORAL_TABLET | ORAL | Status: DC

## 2013-04-30 NOTE — Progress Notes (Signed)
OFFICE NOTE  04/30/2013  CC:  Chief Complaint  Patient presents with  . COPD  . Diabetes    pt needs letter stating she needs DM shoes  . Urinary Incontinence  . Encopresis     HPI: Patient is a 67 y.o. Caucasian female who is here for 1 mo f/u DM 2, COPD/asthma, right knee pain. Still has not done the PFTs I ordered last visit.   Says she saw Dr. Luiz Blare at [facility] and per pt he wants to do right knee surgery in the future for her large popliteal ganglion cyst.  She has been on percocet 5/325, 2 tabs bid for the last 4-6 wks for this pain, asks for RF today.  I have no records from the ortho office at this time. Plans to go to GI for colonoscopy soon---last was 10 yrs ago. Plans to f/u with cardiology after that.    Complains of relatively frequent muscle cramping lately, says she is trying to eat a more potassium-rich diet.  From a psychiatric standpoint she says she feels well. Our plan is to get her hooked back into care at [facility] in the near future.  Did not discuss sugar monitoring today but she did says she has chronic feeling of numbness on both feet.  Pertinent PMH:  Past Medical History  Diagnosis Date  . Obesity   . Bipolar disorder   . Seizure   . Skin cancer   . Asthma   . Depression   . GERD (gastroesophageal reflux disease)   . Hypertension   . Tobacco user   . CAD (coronary artery disease)     Moderate disease of the left circumflex managed medically  . Fibromyalgia   . Chronic pain   . Dyslipidemia   . Diverticul disease small and large intestine, no perforati or abscess   . Gastroparesis   . Overactive bladder   . Anxiety   . Hyperlipidemia   . Urge and stress incontinence   . Chronic cystitis   . Rectal prolapse   . Neurotic excoriations 01/28/2013  . Leukocytosis     chronic, mild, s/p eval 03/2013 by hematologist --reassured; likely secondary to ongoing smoking  . Type II or unspecified type diabetes mellitus with neurological  manifestations, not stated as uncontrolled(250.60) 08/17/2008    Hx of diabetic foot ulcer.   Past Surgical History  Procedure Laterality Date  . Appendectomy    . Abdominal hysterectomy    . Tubal ligation    . Vagiocele    . Bladder surgery    . Eye surgery    . Rectocele repair    . Cholecystectomy    . Right foot surgery    . Cosmetic ear surgery    . Neck surgery    . Esophagogastroduodenoscopy  05/2011    Dr. Murlean Hark at [facility]--s/p dilatation, o/w normal exam.  . Colonoscopy w/ biopsies  05/2009    Dr. Janell Quiet (bx showed benign colonic mucosa)  . Colonoscopy w/ biopsies  09/2002    Dr. Janell Quiet (    MEDS:  Outpatient Prescriptions Prior to Visit  Medication Sig Dispense Refill  . albuterol-ipratropium (COMBIVENT) 18-103 MCG/ACT inhaler Inhale 2 puffs into the lungs every 6 (six) hours as needed for wheezing.  1 Inhaler  1  . Alum & Mag Hydroxide-Simeth (MAGIC MOUTHWASH W/LIDOCAINE) SOLN 1 tsp po swish/gargle and spit four times per day as needed for mouth pain  200 mL  5  . amLODipine (NORVASC) 10 MG tablet  Take 1 tablet (10 mg total) by mouth daily. For hypertension.  30 tablet  3  . aspirin 81 MG EC tablet Take 1 tablet (81 mg total) by mouth daily. For platelette aggregation.  30 tablet  0  . benazepril (LOTENSIN) 20 MG tablet Take 1 tablet (20 mg total) by mouth daily. For hypertension.  30 tablet  3  . clonazePAM (KLONOPIN) 1 MG tablet Take 1 tablet (1 mg total) by mouth 2 (two) times daily as needed. anxiety  60 tablet  3  . diclofenac sodium (VOLTAREN) 1 % GEL Apply 2 g topically as needed.  100 g  3  . DULoxetine (CYMBALTA) 60 MG capsule Take 1 capsule (60 mg total) by mouth daily. For anxiety and depression.  30 capsule  3  . estradiol (ESTRACE) 0.1 MG/GM vaginal cream 0.25 applicators full qd  42.5 g  12  . fexofenadine (ALLEGRA) 180 MG tablet Take 1 tablet (180 mg total) by mouth daily.  30 tablet  6  . fish oil-omega-3 fatty  acids 1000 MG capsule Take 2 capsules (2 g total) by mouth daily. For lipid control.      . fluticasone (CUTIVATE) 0.05 % cream Apply to affected areas of skin twice daily as needed  60 g  1  . gabapentin (NEURONTIN) 100 MG capsule Take 2 capsules (200 mg total) by mouth at bedtime.  60 capsule  3  . glimepiride (AMARYL) 2 MG tablet May take 1 to 1 tabs po qAM for diabetes  60 tablet  3  . glycopyrrolate (ROBINUL) 2 MG tablet Take 1 tablet (2 mg total) by mouth 3 (three) times daily as needed.  90 tablet  3  . lidocaine (LIDODERM) 5 % Place 3 patches onto the skin. Remove & Discard patch within 12 hours or as directed by MD      . methocarbamol (ROBAXIN) 500 MG tablet Take 1 tablet (500 mg total) by mouth 3 (three) times daily.  90 tablet  3  . Nebivolol HCl (BYSTOLIC) 20 MG TABS 1 tab po qd  30 tablet  6  . nitroGLYCERIN (NITROSTAT) 0.4 MG SL tablet Place 1 tablet (0.4 mg total) under the tongue every 5 (five) minutes as needed for chest pain.  90 tablet  3  . omeprazole (PRILOSEC) 20 MG capsule Take 1 capsule (20 mg total) by mouth 2 (two) times daily.  60 capsule  3  . Oxybutynin Chloride (GELNIQUE) 10 % GEL Place 1 application onto the skin daily.  1 g  3  . oxyCODONE-acetaminophen (PERCOCET/ROXICET) 5-325 MG per tablet 1-2 tabs po bid prn pain  120 tablet  0  . pantoprazole (PROTONIX) 40 MG tablet Take 1 tablet (40 mg total) by mouth 2 (two) times daily. For reflux.      . pregabalin (LYRICA) 225 MG capsule Take 1 capsule (225 mg total) by mouth 2 (two) times daily.  60 capsule  3  . tiZANidine (ZANAFLEX) 4 MG tablet       . triamcinolone (NASACORT) 55 MCG/ACT nasal inhaler Place 2 sprays into the nose daily. For allergies.  1 Inhaler  3  . albuterol (PROVENTIL) (2.5 MG/3ML) 0.083% nebulizer solution Take 3 mLs (2.5 mg total) by nebulization every 6 (six) hours as needed for wheezing.  75 mL  1  . pravastatin (PRAVACHOL) 80 MG tablet Take 80 mg by mouth daily.       No facility-administered  medications prior to visit.    PE: Blood pressure 111/75, pulse  67, temperature 98 F (36.7 C), temperature source Temporal, resp. rate 18, height 5\' 4"  (1.626 m), weight 189 lb (85.73 kg), SpO2 96.00%. Gen: Alert, well appearing.  Patient is oriented to person, place, time, and situation. AFFECT: pleasant, lucid thought and speech. FEET: left foot with a couple of moderate sized calluses without underlying color change or fluctuance.  No tenderness.  Right foot without callus.  Monofilament testing reveals diffuse plantar surface decrease in sensation bilaterally. Otherwise feet were bilateral normal; no swelling, tenderness, or vascular lesions.  Peripheral pulses are palpable. Toenails are normal.  LABS: none today Lab Results  Component Value Date   HGBA1C 5.5 01/14/2013    IMPRESSION AND PLAN:  1) DM, with DPR.  Continue neurontin--refilled today.   CMET today.  Recheck A1c at next diabetic f/u in 3-4 mo.  Continue amaryl, diabetic diet.  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   Will try to obtain records from Dr. Luiz Blare at [facility].  For now, I've agreed to continue her percocet 5/325, 2 tabs po bid prn, #120, no RF---short term.    3) Asthma/emphysema: skipped PFTs but started advair anyway.  Says she thinks this has made a difference in her breathing. I still encouraged her to reschedule her PFTs. She has switched over to electronic cigarettes.  I encouraged her to ween off of these as well.  FOLLOW UP: 2 mo

## 2013-05-06 ENCOUNTER — Encounter: Payer: Self-pay | Admitting: Family Medicine

## 2013-05-09 ENCOUNTER — Encounter: Payer: Self-pay | Admitting: Family Medicine

## 2013-06-18 ENCOUNTER — Other Ambulatory Visit: Payer: Self-pay

## 2013-06-18 MED ORDER — PRAVASTATIN SODIUM 80 MG PO TABS: 80.0000 mg | ORAL_TABLET | Freq: Every day | ORAL | Status: DC

## 2013-06-22 ENCOUNTER — Other Ambulatory Visit: Payer: Self-pay | Admitting: Family Medicine

## 2013-06-22 DIAGNOSIS — Z8659 Personal history of other mental and behavioral disorders: Secondary | ICD-10-CM

## 2013-06-22 DIAGNOSIS — I10 Essential (primary) hypertension: Secondary | ICD-10-CM

## 2013-06-22 DIAGNOSIS — R569 Unspecified convulsions: Secondary | ICD-10-CM

## 2013-06-22 DIAGNOSIS — G40909 Epilepsy, unspecified, not intractable, without status epilepticus: Secondary | ICD-10-CM

## 2013-06-22 DIAGNOSIS — IMO0001 Reserved for inherently not codable concepts without codable children: Secondary | ICD-10-CM

## 2013-06-22 DIAGNOSIS — Z7689 Persons encountering health services in other specified circumstances: Secondary | ICD-10-CM

## 2013-06-22 DIAGNOSIS — Z79899 Other long term (current) drug therapy: Secondary | ICD-10-CM

## 2013-06-22 MED ORDER — DULOXETINE HCL 60 MG PO CPEP: 60.0000 mg | ORAL_CAPSULE | Freq: Every day | ORAL | Status: DC

## 2013-06-22 MED ORDER — OXYBUTYNIN CHLORIDE 10 % TD GEL: 1.0000 | Freq: Every day | TRANSDERMAL | Status: DC

## 2013-06-22 MED ORDER — FLUTICASONE PROPIONATE 0.05 % EX CREA: TOPICAL_CREAM | CUTANEOUS | Status: DC

## 2013-06-22 NOTE — Telephone Encounter (Signed)
Patient requesting gelnique and fluticasone creams.  Okay to refill these?

## 2013-06-22 NOTE — Telephone Encounter (Signed)
Yes, it is fine to RF these meds.-thx

## 2013-06-22 NOTE — Telephone Encounter (Signed)
RFs done 

## 2013-06-24 ENCOUNTER — Ambulatory Visit: Payer: Self-pay | Admitting: Gastroenterology

## 2013-07-01 ENCOUNTER — Ambulatory Visit: Payer: Medicare Other | Admitting: Family Medicine

## 2013-07-05 ENCOUNTER — Telehealth: Payer: Self-pay | Admitting: Family Medicine

## 2013-07-05 DIAGNOSIS — Z8659 Personal history of other mental and behavioral disorders: Secondary | ICD-10-CM

## 2013-07-05 DIAGNOSIS — I10 Essential (primary) hypertension: Secondary | ICD-10-CM

## 2013-07-05 DIAGNOSIS — Z7689 Persons encountering health services in other specified circumstances: Secondary | ICD-10-CM

## 2013-07-05 DIAGNOSIS — G40909 Epilepsy, unspecified, not intractable, without status epilepticus: Secondary | ICD-10-CM

## 2013-07-05 DIAGNOSIS — Z79899 Other long term (current) drug therapy: Secondary | ICD-10-CM

## 2013-07-05 DIAGNOSIS — R569 Unspecified convulsions: Secondary | ICD-10-CM

## 2013-07-05 MED ORDER — CLONAZEPAM 1 MG PO TABS: 1.0000 mg | ORAL_TABLET | Freq: Two times a day (BID) | ORAL | Status: DC | PRN

## 2013-07-05 NOTE — Telephone Encounter (Signed)
Patient Information:  Caller Name: Umi  Phone: [phone]  Patient: Teresa, Mathews  Gender: Female  DOB: [birth year]  Age: 67 Years  PCP: Earley Favor [facility])  Office Follow Up:  Does the office need to follow up with this patient?: Yes  Instructions For The Office: Office please review and call pt regarding refill  RN Note:  Office please review and call pt regarding refill- she continues to report that the chest tightness and sob she is having is because she is out of this medication and that she has muscular and skeletal pain- suggested ED evaluation for chest pain /sob and pt states that she is not having cardiac pain  Symptoms  Reason For Call & Symptoms: Pt states that she is on Clonazapam 1mg  BID and she ran out on 06/30/13; pt states that she is having nasuea bc she is out of the medicine and she is jittery; states that she is having  a little sob and little chest tightness; states that she has had this for at least 6 months and MD is aware;  pt states that her sob and chest tightness is nothing different than what she has been having for over 6 months and MD is aware; pt  states that she just needs the Clonazapam and this was given to her to help with these sx; pt requesting a refill on this medication early today because she has no transportation and pharmacy delivers at 2:00pm; she states that she has an appt for Thursday, Nov 6, but needs this medication today  Reviewed Health History In EMR: Yes  Reviewed Medications In EMR: Yes  Reviewed Allergies In EMR: Yes  Reviewed Surgeries / Procedures: Yes  Date of Onset of Symptoms: 06/30/2013  Guideline(s) Used:  No Protocol Available - Sick Adult  Disposition Per Guideline:   Discuss with PCP and Callback by Nurse within 1 Hour  Reason For Disposition Reached:   Nursing judgment  Advice Given:  Call Back If:  New symptoms develop  You become worse.  Patient Will Follow Care Advice:  YES

## 2013-07-05 NOTE — Telephone Encounter (Signed)
Patient was due for an office visit and canceled if on 06/30/2013.   Per Dr. Milinda Cave he is okay with giving her 30 day supply x 1 refill.   Faxed rx to pharmacy.  Patient aware and has a follow up on 07/08/13.

## 2013-07-08 ENCOUNTER — Encounter: Payer: Self-pay | Admitting: Family Medicine

## 2013-07-08 ENCOUNTER — Ambulatory Visit (INDEPENDENT_AMBULATORY_CARE_PROVIDER_SITE_OTHER): Payer: Medicare Other | Admitting: Family Medicine

## 2013-07-08 VITALS — BP 145/81 | HR 75 | Temp 98.4°F | Resp 20 | Ht 64.0 in | Wt 186.0 lb

## 2013-07-08 DIAGNOSIS — G8929 Other chronic pain: Secondary | ICD-10-CM

## 2013-07-08 DIAGNOSIS — F411 Generalized anxiety disorder: Secondary | ICD-10-CM

## 2013-07-08 DIAGNOSIS — M25569 Pain in unspecified knee: Secondary | ICD-10-CM

## 2013-07-08 MED ORDER — OXYCODONE-ACETAMINOPHEN 5-325 MG PO TABS: ORAL_TABLET | ORAL | Status: DC

## 2013-07-08 NOTE — Progress Notes (Signed)
OFFICE NOTE  07/08/2013  CC:  Chief Complaint  Patient presents with  . Follow-up     HPI: Patient is a 67 y.o. Caucasian female who is here for 2 mo f/u. Wants me to do her pain med rx again b/c she is having problems paying her bill with the orthopedist who is managing her knee pain--this causes severe pain from right knee up to right hip region.  Has been out of pain med x 1 mo.  She uses voltaren gel and this helps briefly.  Still has not gone to pulmonary to get PFTs b/c she can't get a day free--she watches a demented female as a favor to the lady's mother.   Pertinent PMH:  Past Medical History  Diagnosis Date  . Obesity   . Seizure   . Skin cancer   . Asthma   . GERD (gastroesophageal reflux disease)   . Hypertension   . Tobacco user   . CAD (coronary artery disease)     Moderate disease of the left circumflex managed medically  . Fibromyalgia   . Chronic pain   . Dyslipidemia   . Diverticul disease small and large intestine, no perforati or abscess   . Gastroparesis   . Overactive bladder   . Anxiety   . Hyperlipidemia   . Urge and stress incontinence   . Chronic cystitis   . Rectal prolapse   . Neurotic excoriations 01/28/2013  . Leukocytosis     chronic, mild, s/p eval 03/2013 by hematologist --reassured; likely secondary to ongoing smoking  . Type II or unspecified type diabetes mellitus with neurological manifestations, not stated as uncontrolled(250.60) 08/17/2008    Hx of diabetic foot ulcer.  . Bipolar disorder     Psych hosp admission 08/2012--manic episode  . COPD (chronic obstructive pulmonary disease)   . Right knee pain Summer 2014    MRI 04/2013: large popliteal fossa ganglion cyst, mod medial compartment and patellofemoral osteoarth--referred to [facility] at that time (Dr. Luiz Blare)   Past Surgical History  Procedure Laterality Date  . Appendectomy    . Abdominal hysterectomy    . Tubal ligation    . Vagiocele    . Bladder surgery    .  Eye surgery    . Rectocele repair    . Cholecystectomy    . Right foot surgery    . Cosmetic ear surgery    . Neck surgery    . Esophagogastroduodenoscopy  05/2011    Dr. Murlean Hark at [facility]--s/p dilatation, o/w normal exam.  . Colonoscopy w/ biopsies  05/2009    Dr. Janell Quiet (bx showed benign colonic mucosa)  . Colonoscopy w/ biopsies  09/2002    Dr. Janell Quiet (     MEDS:  Outpatient Prescriptions Prior to Visit  Medication Sig Dispense Refill  . albuterol (PROVENTIL) (2.5 MG/3ML) 0.083% nebulizer solution Take 3 mLs (2.5 mg total) by nebulization every 6 (six) hours as needed for wheezing.  75 mL  1  . albuterol-ipratropium (COMBIVENT) 18-103 MCG/ACT inhaler Inhale 2 puffs into the lungs every 6 (six) hours as needed for wheezing.  1 Inhaler  1  . Alum & Mag Hydroxide-Simeth (MAGIC MOUTHWASH W/LIDOCAINE) SOLN 1 tsp po swish/gargle and spit four times per day as needed for mouth pain  200 mL  5  . amLODipine (NORVASC) 10 MG tablet Take 1 tablet (10 mg total) by mouth daily. For hypertension.  30 tablet  3  . aspirin 81 MG EC tablet Take 1 tablet (  81 mg total) by mouth daily. For platelette aggregation.  30 tablet  0  . benazepril (LOTENSIN) 20 MG tablet Take 1 tablet (20 mg total) by mouth daily. For hypertension.  30 tablet  3  . clonazePAM (KLONOPIN) 1 MG tablet Take 1 tablet (1 mg total) by mouth 2 (two) times daily as needed. anxiety  60 tablet  1  . diclofenac sodium (VOLTAREN) 1 % GEL Apply 2 g topically as needed.  100 g  3  . DULoxetine (CYMBALTA) 60 MG capsule Take 1 capsule (60 mg total) by mouth daily. For anxiety and depression.  30 capsule  6  . estradiol (ESTRACE) 0.1 MG/GM vaginal cream 0.25 applicators full qd  42.5 g  12  . fexofenadine (ALLEGRA) 180 MG tablet Take 1 tablet (180 mg total) by mouth daily.  30 tablet  6  . fish oil-omega-3 fatty acids 1000 MG capsule Take 2 capsules (2 g total) by mouth daily. For lipid control.      .  fluticasone (CUTIVATE) 0.05 % cream Apply to affected areas of skin twice daily as needed  60 g  3  . gabapentin (NEURONTIN) 100 MG capsule Take 2 capsules (200 mg total) by mouth at bedtime.  60 capsule  6  . glimepiride (AMARYL) 2 MG tablet May take 1 to 1 tabs po qAM for diabetes  60 tablet  3  . glycopyrrolate (ROBINUL) 2 MG tablet Take 1 tablet (2 mg total) by mouth 3 (three) times daily as needed.  90 tablet  3  . lidocaine (LIDODERM) 5 % Place 3 patches onto the skin. Remove & Discard patch within 12 hours or as directed by MD      . methocarbamol (ROBAXIN) 500 MG tablet Take 1 tablet (500 mg total) by mouth 3 (three) times daily.  90 tablet  3  . Nebivolol HCl (BYSTOLIC) 20 MG TABS 1 tab po qd  30 tablet  6  . nitroGLYCERIN (NITROSTAT) 0.4 MG SL tablet Place 1 tablet (0.4 mg total) under the tongue every 5 (five) minutes as needed for chest pain.  90 tablet  3  . omeprazole (PRILOSEC) 20 MG capsule Take 1 capsule (20 mg total) by mouth 2 (two) times daily.  60 capsule  3  . Oxybutynin Chloride (GELNIQUE) 10 % GEL Place 1 application onto the skin daily.  1 g  6  . oxyCODONE-acetaminophen (PERCOCET/ROXICET) 5-325 MG per tablet 1-2 tabs po bid prn pain  120 tablet  0  . pantoprazole (PROTONIX) 40 MG tablet Take 1 tablet (40 mg total) by mouth 2 (two) times daily. For reflux.      . pravastatin (PRAVACHOL) 80 MG tablet Take 1 tablet (80 mg total) by mouth daily.  30 tablet  2  . pregabalin (LYRICA) 225 MG capsule Take 1 capsule (225 mg total) by mouth 2 (two) times daily.  60 capsule  3  . tiZANidine (ZANAFLEX) 4 MG tablet       . triamcinolone (NASACORT) 55 MCG/ACT nasal inhaler Place 2 sprays into the nose daily. For allergies.  1 Inhaler  3   No facility-administered medications prior to visit.    PE: Blood pressure 145/81, pulse 75, temperature 98.4 F (36.9 C), temperature source Temporal, resp. rate 20, height 5\' 4"  (1.626 m), weight 186 lb (84.369 kg), SpO2 97.00%. Gen: Alert, well  appearing.  Patient is oriented to person, place, time, and situation. No further exam today.  IMPRESSION AND PLAN:  Chronic pain of right knee  Discussed approach to treatment today so we would both be on the same page. I did give a rx today for percocet 5/325, #120, no RF. I told her this would be the last rx of this med from me.  She says she is going to get back with her orthopedist "when I get my bill paid" and then they will assume the responsibility for treating her knee pain. If they don't want to do this then I will have to refer her to a pain mgmt clinic.  ANXIETY Continue cymbalts and bid clonazepam. Controlled substance contract reviewed with patient today.  Patient signed this and it will be placed in the chart.   UDS today.    An After Visit Summary was printed and given to the patient.  FOLLOW UP: 2 mo f/u DM 2.

## 2013-07-12 DIAGNOSIS — G8929 Other chronic pain: Secondary | ICD-10-CM | POA: Insufficient documentation

## 2013-07-12 NOTE — Assessment & Plan Note (Signed)
Continue cymbalts and bid clonazepam. Controlled substance contract reviewed with patient today.  Patient signed this and it will be placed in the chart.   UDS today.

## 2013-07-12 NOTE — Assessment & Plan Note (Addendum)
Discussed approach to treatment today so we would both be on the same page. I did give a rx today for percocet 5/325, #120, no RF. I told her this would be the last rx of this med from me.  She says she is going to get back with her orthopedist "when I get my bill paid" and then they will assume the responsibility for treating her knee pain. If they don't want to do this then I will have to refer her to a pain mgmt clinic.

## 2013-07-21 ENCOUNTER — Telehealth: Payer: Self-pay | Admitting: Family Medicine

## 2013-07-21 DIAGNOSIS — Z7689 Persons encountering health services in other specified circumstances: Secondary | ICD-10-CM

## 2013-07-21 DIAGNOSIS — Z8659 Personal history of other mental and behavioral disorders: Secondary | ICD-10-CM

## 2013-07-21 DIAGNOSIS — R569 Unspecified convulsions: Secondary | ICD-10-CM

## 2013-07-21 DIAGNOSIS — G40909 Epilepsy, unspecified, not intractable, without status epilepticus: Secondary | ICD-10-CM

## 2013-07-21 DIAGNOSIS — Z79899 Other long term (current) drug therapy: Secondary | ICD-10-CM

## 2013-07-21 DIAGNOSIS — I10 Essential (primary) hypertension: Secondary | ICD-10-CM

## 2013-07-21 DIAGNOSIS — IMO0001 Reserved for inherently not codable concepts without codable children: Secondary | ICD-10-CM

## 2013-07-21 MED ORDER — PREGABALIN 225 MG PO CAPS: 225.0000 mg | ORAL_CAPSULE | Freq: Two times a day (BID) | ORAL | Status: DC

## 2013-07-21 MED ORDER — OMEPRAZOLE 20 MG PO CPDR: 20.0000 mg | DELAYED_RELEASE_CAPSULE | Freq: Two times a day (BID) | ORAL | Status: DC

## 2013-07-21 NOTE — Telephone Encounter (Signed)
Faxed to pharmacy

## 2013-07-21 NOTE — Telephone Encounter (Signed)
Patient requesting refill of lyrica.  Patient last seen in office 07/08/13.  Last rx was 01/14/13 x 3 refills.  Please advise refill.

## 2013-07-21 NOTE — Telephone Encounter (Signed)
Lyrica rx printed.

## 2013-07-23 ENCOUNTER — Ambulatory Visit: Payer: Medicare Other | Admitting: Family Medicine

## 2013-07-26 ENCOUNTER — Encounter: Payer: Self-pay | Admitting: Family Medicine

## 2013-07-26 ENCOUNTER — Ambulatory Visit (INDEPENDENT_AMBULATORY_CARE_PROVIDER_SITE_OTHER): Payer: Medicare Other | Admitting: Family Medicine

## 2013-07-26 VITALS — BP 147/71 | HR 70 | Temp 98.8°F | Resp 20 | Ht 64.0 in | Wt 186.0 lb

## 2013-07-26 DIAGNOSIS — E1149 Type 2 diabetes mellitus with other diabetic neurological complication: Secondary | ICD-10-CM

## 2013-07-26 DIAGNOSIS — F17200 Nicotine dependence, unspecified, uncomplicated: Secondary | ICD-10-CM

## 2013-07-26 DIAGNOSIS — J441 Chronic obstructive pulmonary disease with (acute) exacerbation: Secondary | ICD-10-CM

## 2013-07-26 MED ORDER — AZITHROMYCIN 250 MG PO TABS: ORAL_TABLET | ORAL | Status: DC

## 2013-07-26 MED ORDER — PREDNISONE 20 MG PO TABS: ORAL_TABLET | ORAL | Status: DC

## 2013-07-26 NOTE — Progress Notes (Signed)
OFFICE NOTE  07/26/2013  CC:  Chief Complaint  Patient presents with  . Wheezing  . Shortness of Breath  . Cough     HPI: Patient is a 67 y.o. Caucasian female who is here for cough. Onset about 2-3 wks ago, T 102--most recently 1 wk ago.  Thick/clear nasal drainage. Coughing up lots of yellowish/clear phlegm.  Feels SOB, worse when lying supine and with ambulating.  Mucinex plain helping some.  Nebulizer/inhaler helps some.  Second hand smoke + pt smoking as well.  Pertinent PMH:  Past Medical History  Diagnosis Date  . Obesity   . Seizure   . Skin cancer   . Asthma   . GERD (gastroesophageal reflux disease)   . Hypertension   . Tobacco user   . CAD (coronary artery disease)     Moderate disease of the left circumflex managed medically  . Fibromyalgia   . Chronic pain   . Dyslipidemia   . Diverticul disease small and large intestine, no perforati or abscess   . Gastroparesis   . Overactive bladder   . Anxiety   . Hyperlipidemia   . Urge and stress incontinence   . Chronic cystitis   . Rectal prolapse   . Neurotic excoriations 01/28/2013  . Leukocytosis     chronic, mild, s/p eval 03/2013 by hematologist --reassured; likely secondary to ongoing smoking  . Type II or unspecified type diabetes mellitus with neurological manifestations, not stated as uncontrolled(250.60) 08/17/2008    Hx of diabetic foot ulcer.  . Bipolar disorder     Psych hosp admission 08/2012--manic episode  . COPD (chronic obstructive pulmonary disease)   . Right knee pain Summer 2014    MRI 04/2013: large popliteal fossa ganglion cyst, mod medial compartment and patellofemoral osteoarth--referred to [facility] at that time (Dr. Luiz Blare)   Past surgical, social, and family history reviewed and no changes noted since last office visit.  MEDS:  Outpatient Prescriptions Prior to Visit  Medication Sig Dispense Refill  . albuterol (PROVENTIL) (2.5 MG/3ML) 0.083% nebulizer solution Take 3 mLs (2.5  mg total) by nebulization every 6 (six) hours as needed for wheezing.  75 mL  1  . albuterol-ipratropium (COMBIVENT) 18-103 MCG/ACT inhaler Inhale 2 puffs into the lungs every 6 (six) hours as needed for wheezing.  1 Inhaler  1  . Alum & Mag Hydroxide-Simeth (MAGIC MOUTHWASH W/LIDOCAINE) SOLN 1 tsp po swish/gargle and spit four times per day as needed for mouth pain  200 mL  5  . amLODipine (NORVASC) 10 MG tablet Take 1 tablet (10 mg total) by mouth daily. For hypertension.  30 tablet  3  . aspirin 81 MG EC tablet Take 1 tablet (81 mg total) by mouth daily. For platelette aggregation.  30 tablet  0  . benazepril (LOTENSIN) 20 MG tablet Take 1 tablet (20 mg total) by mouth daily. For hypertension.  30 tablet  3  . clonazePAM (KLONOPIN) 1 MG tablet Take 1 tablet (1 mg total) by mouth 2 (two) times daily as needed. anxiety  60 tablet  1  . diclofenac sodium (VOLTAREN) 1 % GEL Apply 2 g topically as needed.  100 g  3  . DULoxetine (CYMBALTA) 60 MG capsule Take 1 capsule (60 mg total) by mouth daily. For anxiety and depression.  30 capsule  6  . estradiol (ESTRACE) 0.1 MG/GM vaginal cream 0.25 applicators full qd  42.5 g  12  . fexofenadine (ALLEGRA) 180 MG tablet Take 1 tablet (180 mg  total) by mouth daily.  30 tablet  6  . fish oil-omega-3 fatty acids 1000 MG capsule Take 2 capsules (2 g total) by mouth daily. For lipid control.      . fluticasone (CUTIVATE) 0.05 % cream Apply to affected areas of skin twice daily as needed  60 g  3  . gabapentin (NEURONTIN) 100 MG capsule Take 2 capsules (200 mg total) by mouth at bedtime.  60 capsule  6  . glimepiride (AMARYL) 2 MG tablet May take 1 to 1 tabs po qAM for diabetes  60 tablet  3  . glycopyrrolate (ROBINUL) 2 MG tablet Take 1 tablet (2 mg total) by mouth 3 (three) times daily as needed.  90 tablet  3  . methocarbamol (ROBAXIN) 500 MG tablet Take 1 tablet (500 mg total) by mouth 3 (three) times daily.  90 tablet  3  . Nebivolol HCl (BYSTOLIC) 20 MG TABS 1  tab po qd  30 tablet  6  . nitroGLYCERIN (NITROSTAT) 0.4 MG SL tablet Place 1 tablet (0.4 mg total) under the tongue every 5 (five) minutes as needed for chest pain.  90 tablet  3  . omeprazole (PRILOSEC) 20 MG capsule Take 1 capsule (20 mg total) by mouth 2 (two) times daily.  60 capsule  3  . Oxybutynin Chloride (GELNIQUE) 10 % GEL Place 1 application onto the skin daily.  1 g  6  . oxyCODONE-acetaminophen (PERCOCET/ROXICET) 5-325 MG per tablet 1-2 tabs po bid prn pain  120 tablet  0  . pantoprazole (PROTONIX) 40 MG tablet Take 1 tablet (40 mg total) by mouth 2 (two) times daily. For reflux.      . pravastatin (PRAVACHOL) 80 MG tablet Take 1 tablet (80 mg total) by mouth daily.  30 tablet  2  . pregabalin (LYRICA) 225 MG capsule Take 1 capsule (225 mg total) by mouth 2 (two) times daily.  60 capsule  5  . promethazine (PHENERGAN) 12.5 MG tablet       . tiZANidine (ZANAFLEX) 4 MG tablet       . triamcinolone (NASACORT) 55 MCG/ACT nasal inhaler Place 2 sprays into the nose daily. For allergies.  1 Inhaler  3  . lidocaine (LIDODERM) 5 % Place 3 patches onto the skin. Remove & Discard patch within 12 hours or as directed by MD       No facility-administered medications prior to visit.    PE: Blood pressure 147/71, pulse 70, temperature 98.8 F (37.1 C), temperature source Temporal, resp. rate 20, height 5\' 4"  (1.626 m), weight 186 lb (84.369 kg), SpO2 95.00%. Gen: Alert, well appearing.  Patient is oriented to person, place, time, and situation. Oropharynx normal.   Nose injected, no purulent mucous. TMs: no fluid or erythema, right TM retracted. Neck: supple, no LAD. CV: RRR, distant S1 and S2, no murmur or rub. LUNGS: diffuse soft insp and exp wheezing, moderate obstruction to aeration, nonlabored resps. EXT: no cyanosis  IMPRESSION AND PLAN:  COPD exacerbation: azithromycin x 5d, prednisone x 10d taper (40qd x 5, then 20 qd x 5). Continue bronchodilators, mucinex, tylenol/ibu  prn.  DM 2 with periph neurop: pt request rx for "diabetic bedroom slippers" so I sent this rx to her pharmacy today.  FOLLOW UP: 1 wk, at which time we'll also try to address her chronic right knee pain and her request for a brace for this knee.

## 2013-08-02 ENCOUNTER — Encounter: Payer: Self-pay | Admitting: Family Medicine

## 2013-08-02 ENCOUNTER — Ambulatory Visit (INDEPENDENT_AMBULATORY_CARE_PROVIDER_SITE_OTHER): Payer: Medicare Other | Admitting: Family Medicine

## 2013-08-02 VITALS — BP 117/75 | HR 56 | Temp 99.7°F | Resp 20 | Ht 64.0 in | Wt 186.0 lb

## 2013-08-02 DIAGNOSIS — M25569 Pain in unspecified knee: Secondary | ICD-10-CM

## 2013-08-02 DIAGNOSIS — G8929 Other chronic pain: Secondary | ICD-10-CM

## 2013-08-02 DIAGNOSIS — M2351 Chronic instability of knee, right knee: Secondary | ICD-10-CM

## 2013-08-02 DIAGNOSIS — J441 Chronic obstructive pulmonary disease with (acute) exacerbation: Secondary | ICD-10-CM

## 2013-08-02 DIAGNOSIS — M238X9 Other internal derangements of unspecified knee: Secondary | ICD-10-CM

## 2013-08-02 MED ORDER — ALBUTEROL SULFATE (2.5 MG/3ML) 0.083% IN NEBU: 2.5000 mg | INHALATION_SOLUTION | Freq: Four times a day (QID) | RESPIRATORY_TRACT | Status: DC | PRN

## 2013-08-02 MED ORDER — OXYCODONE-ACETAMINOPHEN 5-325 MG PO TABS: ORAL_TABLET | ORAL | Status: DC

## 2013-08-02 NOTE — Progress Notes (Signed)
Pre visit review using our clinic review tool, if applicable. No additional management support is needed unless otherwise documented below in the visit note.  OFFICE NOTE  08/02/2013  CC:  Chief Complaint  Patient presents with  . Follow-up     HPI: Patient is a 67 y.o. Caucasian female who is here for 1 wk f/u [facility]--feeling improved after a course of azithromycin x 5d and she has been finishing up a steroid taper.  No fevers. Uses albuterol twice a day and this is her usual regimen.  Still smoking cigs.  Ongoing problem with pain in right knee.  Her knee still intermittently swells and buckles.  The right knee hurts constantly in popliteal area and radiates up back of thigh into hip region. She has seen an orthopedist (Dr. Luiz Blare) in the past for her knee pain, dx'd with osteoarthritis and a popliteal cyst.  She has not been able to get back to visit him b/c of some payment disputes involving her insurer.  She asks for a knee brace for right knee in the meantime.  Pertinent PMH:  Past Medical History  Diagnosis Date  . Obesity   . Seizure   . Skin cancer   . Asthma   . GERD (gastroesophageal reflux disease)   . Hypertension   . Tobacco user   . CAD (coronary artery disease)     Moderate disease of the left circumflex managed medically  . Fibromyalgia   . Chronic pain   . Dyslipidemia   . Diverticul disease small and large intestine, no perforati or abscess   . Gastroparesis   . Overactive bladder   . Anxiety   . Hyperlipidemia   . Urge and stress incontinence   . Chronic cystitis   . Rectal prolapse   . Neurotic excoriations 01/28/2013  . Leukocytosis     chronic, mild, s/p eval 03/2013 by hematologist --reassured; likely secondary to ongoing smoking  . Type II or unspecified type diabetes mellitus with neurological manifestations, not stated as uncontrolled(250.60) 08/17/2008    Hx of diabetic foot ulcer.  . Bipolar disorder     Psych hosp admission 08/2012--manic  episode  . COPD (chronic obstructive pulmonary disease)   . Right knee pain Summer 2014    MRI 04/2013: large popliteal fossa ganglion cyst, mod medial compartment and patellofemoral osteoarth--referred to [facility] at that time (Dr. Luiz Blare)   Past surgical, social, and family history reviewed and no changes noted since last office visit.  MEDS:  Outpatient Prescriptions Prior to Visit  Medication Sig Dispense Refill  . albuterol-ipratropium (COMBIVENT) 18-103 MCG/ACT inhaler Inhale 2 puffs into the lungs every 6 (six) hours as needed for wheezing.  1 Inhaler  1  . Alum & Mag Hydroxide-Simeth (MAGIC MOUTHWASH W/LIDOCAINE) SOLN 1 tsp po swish/gargle and spit four times per day as needed for mouth pain  200 mL  5  . amLODipine (NORVASC) 10 MG tablet Take 1 tablet (10 mg total) by mouth daily. For hypertension.  30 tablet  3  . aspirin 81 MG EC tablet Take 1 tablet (81 mg total) by mouth daily. For platelette aggregation.  30 tablet  0  . benazepril (LOTENSIN) 20 MG tablet Take 1 tablet (20 mg total) by mouth daily. For hypertension.  30 tablet  3  . clonazePAM (KLONOPIN) 1 MG tablet Take 1 tablet (1 mg total) by mouth 2 (two) times daily as needed. anxiety  60 tablet  1  . diclofenac sodium (VOLTAREN) 1 % GEL Apply  2 g topically as needed.  100 g  3  . DULoxetine (CYMBALTA) 60 MG capsule Take 1 capsule (60 mg total) by mouth daily. For anxiety and depression.  30 capsule  6  . estradiol (ESTRACE) 0.1 MG/GM vaginal cream 0.25 applicators full qd  42.5 g  12  . fexofenadine (ALLEGRA) 180 MG tablet Take 1 tablet (180 mg total) by mouth daily.  30 tablet  6  . fish oil-omega-3 fatty acids 1000 MG capsule Take 2 capsules (2 g total) by mouth daily. For lipid control.      . fluticasone (CUTIVATE) 0.05 % cream Apply to affected areas of skin twice daily as needed  60 g  3  . gabapentin (NEURONTIN) 100 MG capsule Take 2 capsules (200 mg total) by mouth at bedtime.  60 capsule  6  . glimepiride  (AMARYL) 2 MG tablet May take 1 to 1 tabs po qAM for diabetes  60 tablet  3  . glycopyrrolate (ROBINUL) 2 MG tablet Take 1 tablet (2 mg total) by mouth 3 (three) times daily as needed.  90 tablet  3  . lidocaine (LIDODERM) 5 % Place 3 patches onto the skin. Remove & Discard patch within 12 hours or as directed by MD      . methocarbamol (ROBAXIN) 500 MG tablet Take 1 tablet (500 mg total) by mouth 3 (three) times daily.  90 tablet  3  . Nebivolol HCl (BYSTOLIC) 20 MG TABS 1 tab po qd  30 tablet  6  . nitroGLYCERIN (NITROSTAT) 0.4 MG SL tablet Place 1 tablet (0.4 mg total) under the tongue every 5 (five) minutes as needed for chest pain.  90 tablet  3  . omeprazole (PRILOSEC) 20 MG capsule Take 1 capsule (20 mg total) by mouth 2 (two) times daily.  60 capsule  3  . Oxybutynin Chloride (GELNIQUE) 10 % GEL Place 1 application onto the skin daily.  1 g  6  . oxyCODONE-acetaminophen (PERCOCET/ROXICET) 5-325 MG per tablet 1-2 tabs po bid prn pain  120 tablet  0  . pantoprazole (PROTONIX) 40 MG tablet Take 1 tablet (40 mg total) by mouth 2 (two) times daily. For reflux.      . pravastatin (PRAVACHOL) 80 MG tablet Take 1 tablet (80 mg total) by mouth daily.  30 tablet  2  . predniSONE (DELTASONE) 20 MG tablet 2 tabs po qd x 5d, then 1 tab po qd x 5d  15 tablet  0  . pregabalin (LYRICA) 225 MG capsule Take 1 capsule (225 mg total) by mouth 2 (two) times daily.  60 capsule  5  . promethazine (PHENERGAN) 12.5 MG tablet       . tiZANidine (ZANAFLEX) 4 MG tablet       . triamcinolone (NASACORT) 55 MCG/ACT nasal inhaler Place 2 sprays into the nose daily. For allergies.  1 Inhaler  3  . albuterol (PROVENTIL) (2.5 MG/3ML) 0.083% nebulizer solution Take 3 mLs (2.5 mg total) by nebulization every 6 (six) hours as needed for wheezing.  75 mL  1  . azithromycin (ZITHROMAX) 250 MG tablet 2 tabs po qd x 1d, then 1 tab po qd x 4d  6 each  0   No facility-administered medications prior to visit.    PE: Blood  pressure 117/75, pulse 56, temperature 99.7 F (37.6 C), temperature source Temporal, resp. rate 20, height 5\' 4"  (1.626 m), weight 186 lb (84.369 kg), SpO2 97.00%. Gen: Alert, well appearing.  Patient is oriented to  person, place, time, and situation. ZHY:QMVH: no injection, icteris, swelling, or exudate.  EOMI, PERRLA. Mouth: lips without lesion/swelling.  Oral mucosa pink and moist. Oropharynx without erythema, exudate, or swelling.  Neck - No masses or thyromegaly or limitation in range of motion CV: RRR, no m/r/g.   LUNGS: CTA bilat, nonlabored resps, good aeration in all lung fields. Right knee: pt hobbles to the exam table, needs my hand to balance to get on and off table. She can flex/extend knee fine and she has no ligamentous instability on exam, but passive knee ROM and stability testing cause her pain in knee.    LABS: none today  IMPRESSION AND PLAN:  1) Acute exac of COPD--resolving appropriately. Finish systemic steroids, continue chronic COPD meds.  Encouraged complete smoking cessation.  2) Chronic right knee pain and instability: secondary to OA and popliteal cyst. She has seen Dr. Luiz Blare, orthopedist, and plans on f/u with him as soon as payment/insurance issues are ironed out--hopefully within 1 month.  I did agree to extend her narcotic pain treatment one more rx: #120 of the percocet 5/325 tabs, 1-2 bid prn pain. I did check the [place] controlled subst prescribing website for any abnormal/suspicious activity for her and there was none.  An After Visit Summary was printed and given to the patient.  FOLLOW UP: 78mo f/u DM 2, HTN, COPD

## 2013-08-06 ENCOUNTER — Other Ambulatory Visit: Payer: Self-pay | Admitting: *Deleted

## 2013-08-06 DIAGNOSIS — R569 Unspecified convulsions: Secondary | ICD-10-CM

## 2013-08-06 DIAGNOSIS — I10 Essential (primary) hypertension: Secondary | ICD-10-CM

## 2013-08-06 DIAGNOSIS — Z8659 Personal history of other mental and behavioral disorders: Secondary | ICD-10-CM

## 2013-08-06 DIAGNOSIS — G40909 Epilepsy, unspecified, not intractable, without status epilepticus: Secondary | ICD-10-CM

## 2013-08-06 DIAGNOSIS — Z79899 Other long term (current) drug therapy: Secondary | ICD-10-CM

## 2013-08-06 DIAGNOSIS — Z7689 Persons encountering health services in other specified circumstances: Secondary | ICD-10-CM

## 2013-08-06 NOTE — Telephone Encounter (Signed)
Refill request for voltaren gel Last filled by MD on - 01/14/13 x3 Last Appt- 08/02/13 Next Appt- 11/03/13 Please advise refill?

## 2013-08-08 MED ORDER — DICLOFENAC SODIUM 1 % TD GEL: 2.0000 g | TRANSDERMAL | Status: DC | PRN

## 2013-08-10 ENCOUNTER — Telehealth: Payer: Self-pay | Admitting: Family Medicine

## 2013-08-10 DIAGNOSIS — Z7689 Persons encountering health services in other specified circumstances: Secondary | ICD-10-CM

## 2013-08-10 DIAGNOSIS — G40909 Epilepsy, unspecified, not intractable, without status epilepticus: Secondary | ICD-10-CM

## 2013-08-10 DIAGNOSIS — R569 Unspecified convulsions: Secondary | ICD-10-CM

## 2013-08-10 DIAGNOSIS — I10 Essential (primary) hypertension: Secondary | ICD-10-CM

## 2013-08-10 DIAGNOSIS — Z8659 Personal history of other mental and behavioral disorders: Secondary | ICD-10-CM

## 2013-08-10 DIAGNOSIS — Z79899 Other long term (current) drug therapy: Secondary | ICD-10-CM

## 2013-08-10 NOTE — Telephone Encounter (Signed)
Patient called requesting refill on her robaxin.  She states that her muscle cramps are really bad and have her almost bent over in pain.  Patient last seen 08/02/2013.  Last rx was on 01/14/13 x 3 refills.  Please advise.

## 2013-08-11 MED ORDER — METHOCARBAMOL 500 MG PO TABS: 500.0000 mg | ORAL_TABLET | Freq: Three times a day (TID) | ORAL | Status: DC

## 2013-08-11 NOTE — Telephone Encounter (Signed)
Robaxin eRx'd.

## 2013-08-13 ENCOUNTER — Other Ambulatory Visit: Payer: Self-pay | Admitting: Family Medicine

## 2013-08-13 DIAGNOSIS — I10 Essential (primary) hypertension: Secondary | ICD-10-CM

## 2013-08-13 DIAGNOSIS — Z8659 Personal history of other mental and behavioral disorders: Secondary | ICD-10-CM

## 2013-08-13 DIAGNOSIS — Z79899 Other long term (current) drug therapy: Secondary | ICD-10-CM

## 2013-08-13 DIAGNOSIS — R569 Unspecified convulsions: Secondary | ICD-10-CM

## 2013-08-13 DIAGNOSIS — G40909 Epilepsy, unspecified, not intractable, without status epilepticus: Secondary | ICD-10-CM

## 2013-08-13 DIAGNOSIS — Z7689 Persons encountering health services in other specified circumstances: Secondary | ICD-10-CM

## 2013-08-13 MED ORDER — BENAZEPRIL HCL 20 MG PO TABS: 20.0000 mg | ORAL_TABLET | Freq: Every day | ORAL | Status: DC

## 2013-08-13 MED ORDER — AMLODIPINE BESYLATE 10 MG PO TABS: 10.0000 mg | ORAL_TABLET | Freq: Every day | ORAL | Status: DC

## 2013-08-20 ENCOUNTER — Telehealth: Payer: Self-pay | Admitting: Family Medicine

## 2013-08-20 DIAGNOSIS — Z8659 Personal history of other mental and behavioral disorders: Secondary | ICD-10-CM

## 2013-08-20 DIAGNOSIS — Z7689 Persons encountering health services in other specified circumstances: Secondary | ICD-10-CM

## 2013-08-20 DIAGNOSIS — G40909 Epilepsy, unspecified, not intractable, without status epilepticus: Secondary | ICD-10-CM

## 2013-08-20 DIAGNOSIS — I10 Essential (primary) hypertension: Secondary | ICD-10-CM

## 2013-08-20 DIAGNOSIS — R569 Unspecified convulsions: Secondary | ICD-10-CM

## 2013-08-20 DIAGNOSIS — Z79899 Other long term (current) drug therapy: Secondary | ICD-10-CM

## 2013-08-20 MED ORDER — TRIAMCINOLONE ACETONIDE 55 MCG/ACT NA AERO: 2.0000 | INHALATION_SPRAY | Freq: Every day | NASAL | Status: DC

## 2013-08-20 NOTE — Telephone Encounter (Signed)
Patient requesting a refill on triamcinol spray but for some reason epic won't allow me to refill.  Please advise.

## 2013-08-20 NOTE — Telephone Encounter (Signed)
Rx done. 

## 2013-08-24 ENCOUNTER — Other Ambulatory Visit: Payer: Self-pay | Admitting: Family Medicine

## 2013-08-24 DIAGNOSIS — Z79899 Other long term (current) drug therapy: Secondary | ICD-10-CM

## 2013-08-24 DIAGNOSIS — Z7689 Persons encountering health services in other specified circumstances: Secondary | ICD-10-CM

## 2013-08-24 DIAGNOSIS — Z8659 Personal history of other mental and behavioral disorders: Secondary | ICD-10-CM

## 2013-08-24 DIAGNOSIS — R569 Unspecified convulsions: Secondary | ICD-10-CM

## 2013-08-24 DIAGNOSIS — I10 Essential (primary) hypertension: Secondary | ICD-10-CM

## 2013-08-24 DIAGNOSIS — G40909 Epilepsy, unspecified, not intractable, without status epilepticus: Secondary | ICD-10-CM

## 2013-08-24 MED ORDER — IPRATROPIUM-ALBUTEROL 18-103 MCG/ACT IN AERO: 2.0000 | INHALATION_SPRAY | Freq: Four times a day (QID) | RESPIRATORY_TRACT | Status: DC | PRN

## 2013-08-25 ENCOUNTER — Encounter: Payer: Self-pay | Admitting: Family Medicine

## 2013-08-30 ENCOUNTER — Telehealth: Payer: Self-pay | Admitting: Family Medicine

## 2013-08-30 DIAGNOSIS — Z79899 Other long term (current) drug therapy: Secondary | ICD-10-CM

## 2013-08-30 DIAGNOSIS — Z8659 Personal history of other mental and behavioral disorders: Secondary | ICD-10-CM

## 2013-08-30 DIAGNOSIS — I10 Essential (primary) hypertension: Secondary | ICD-10-CM

## 2013-08-30 DIAGNOSIS — Z7689 Persons encountering health services in other specified circumstances: Secondary | ICD-10-CM

## 2013-08-30 DIAGNOSIS — G40909 Epilepsy, unspecified, not intractable, without status epilepticus: Secondary | ICD-10-CM

## 2013-08-30 DIAGNOSIS — R569 Unspecified convulsions: Secondary | ICD-10-CM

## 2013-08-30 MED ORDER — CLONAZEPAM 1 MG PO TABS: 1.0000 mg | ORAL_TABLET | Freq: Two times a day (BID) | ORAL | Status: DC | PRN

## 2013-08-30 NOTE — Telephone Encounter (Signed)
Alpraz rx printed. 

## 2013-08-30 NOTE — Telephone Encounter (Signed)
Refill request for clonazepam.  Patient last seen 08/02/13.  Rx was last printed 07/05/13 x 1 refill.  Please advise.  Patient is about 6 days early.

## 2013-08-31 NOTE — Telephone Encounter (Signed)
Rx faxed to pharmacy  

## 2013-09-03 ENCOUNTER — Other Ambulatory Visit: Payer: Self-pay | Admitting: Family Medicine

## 2013-09-03 MED ORDER — TIZANIDINE HCL 4 MG PO TABS: 4.0000 mg | ORAL_TABLET | Freq: Four times a day (QID) | ORAL | Status: DC | PRN

## 2013-09-03 MED ORDER — METOPROLOL SUCCINATE ER 50 MG PO TB24: 50.0000 mg | ORAL_TABLET | Freq: Every day | ORAL | Status: DC

## 2013-09-03 NOTE — Telephone Encounter (Signed)
Changed patient's methocarbamol to tizanidine per insurance.  Also changed bystolic to toprol XL 50mg  per [employer].

## 2013-09-06 MED ORDER — METOPROLOL SUCCINATE ER 50 MG PO TB24: 50.0000 mg | ORAL_TABLET | Freq: Every day | ORAL | Status: DC

## 2013-09-06 MED ORDER — TIZANIDINE HCL 4 MG PO TABS: 4.0000 mg | ORAL_TABLET | Freq: Four times a day (QID) | ORAL | Status: DC | PRN

## 2013-09-06 NOTE — Addendum Note (Signed)
Addended by: Ralph Dowdy on: 09/06/2013 12:04 PM   Modules accepted: Orders

## 2013-09-09 ENCOUNTER — Telehealth: Payer: Self-pay | Admitting: *Deleted

## 2013-09-09 NOTE — Telephone Encounter (Signed)
[facility] called for status on PA for robaxin. Please advise?

## 2013-09-09 NOTE — Telephone Encounter (Signed)
Patient's insurance denied PA for robaxin.  Patient's pharmacy aware.

## 2013-09-13 ENCOUNTER — Other Ambulatory Visit: Payer: Self-pay | Admitting: *Deleted

## 2013-09-13 MED ORDER — TIZANIDINE HCL 4 MG PO TABS: 4.0000 mg | ORAL_TABLET | Freq: Four times a day (QID) | ORAL | Status: DC | PRN

## 2013-09-14 ENCOUNTER — Other Ambulatory Visit: Payer: Self-pay | Admitting: Family Medicine

## 2013-09-14 MED ORDER — TIZANIDINE HCL 4 MG PO TABS: 4.0000 mg | ORAL_TABLET | Freq: Four times a day (QID) | ORAL | Status: DC | PRN

## 2013-09-15 ENCOUNTER — Other Ambulatory Visit: Payer: Self-pay | Admitting: Family Medicine

## 2013-09-15 MED ORDER — TIZANIDINE HCL 4 MG PO TABS: 4.0000 mg | ORAL_TABLET | Freq: Four times a day (QID) | ORAL | Status: DC | PRN

## 2013-09-15 MED ORDER — METOPROLOL SUCCINATE ER 50 MG PO TB24: 50.0000 mg | ORAL_TABLET | Freq: Every day | ORAL | Status: DC

## 2013-10-08 ENCOUNTER — Ambulatory Visit: Payer: Medicare Other | Admitting: Family Medicine

## 2013-10-08 ENCOUNTER — Telehealth: Payer: Self-pay | Admitting: Family Medicine

## 2013-10-08 NOTE — Telephone Encounter (Signed)
I called patient back and LM stating that she should probably go to hospital since she wasn't able to get relief from oral promethazine per her original message.

## 2013-10-08 NOTE — Telephone Encounter (Signed)
Patient LMOM requesting promethazine suppositories.   Please advise.

## 2013-10-13 ENCOUNTER — Ambulatory Visit: Payer: Medicare Other | Admitting: Family Medicine

## 2013-10-13 ENCOUNTER — Telehealth: Payer: Self-pay | Admitting: Family Medicine

## 2013-10-13 NOTE — Telephone Encounter (Signed)
Patient has lost Rx for diabetic shoes & depends. Can a new one be mailed patient? Patient had a reaction to Prednisone 5MG  Tab that was prescribed by Dr. Berenice Primas. She was throwing up, ears swelled & could hear pounding in her ears & was constipated & stools were black when she finally was able to go.

## 2013-10-18 NOTE — Telephone Encounter (Signed)
Please advise 

## 2013-10-20 ENCOUNTER — Encounter: Payer: Self-pay | Admitting: Family Medicine

## 2013-10-20 ENCOUNTER — Ambulatory Visit (INDEPENDENT_AMBULATORY_CARE_PROVIDER_SITE_OTHER): Payer: Medicare Other | Admitting: Family Medicine

## 2013-10-20 VITALS — BP 116/75 | HR 70 | Temp 98.0°F | Resp 22 | Ht 64.0 in | Wt 186.0 lb

## 2013-10-20 DIAGNOSIS — G894 Chronic pain syndrome: Secondary | ICD-10-CM

## 2013-10-20 DIAGNOSIS — Z23 Encounter for immunization: Secondary | ICD-10-CM

## 2013-10-20 DIAGNOSIS — Z1239 Encounter for other screening for malignant neoplasm of breast: Secondary | ICD-10-CM

## 2013-10-20 DIAGNOSIS — I10 Essential (primary) hypertension: Secondary | ICD-10-CM

## 2013-10-20 DIAGNOSIS — E1149 Type 2 diabetes mellitus with other diabetic neurological complication: Secondary | ICD-10-CM

## 2013-10-20 LAB — COMPREHENSIVE METABOLIC PANEL
ALBUMIN: 3.5 g/dL (ref 3.5–5.2)
ALT: 18 U/L (ref 0–35)
AST: 17 U/L (ref 0–37)
Alkaline Phosphatase: 57 U/L (ref 39–117)
BUN: 6 mg/dL (ref 6–23)
CALCIUM: 9.3 mg/dL (ref 8.4–10.5)
CHLORIDE: 101 meq/L (ref 96–112)
CO2: 25 meq/L (ref 19–32)
Creatinine, Ser: 0.9 mg/dL (ref 0.4–1.2)
GFR: 66.68 mL/min (ref >60.00)
Glucose, Bld: 71 mg/dL (ref 70–99)
POTASSIUM: 3.5 meq/L (ref 3.5–5.1)
Sodium: 135 mEq/L (ref 135–145)
Total Bilirubin: 0.2 mg/dL — ABNORMAL LOW (ref 0.3–1.2)
Total Protein: 7 g/dL (ref 6.0–8.3)

## 2013-10-20 LAB — HEMOGLOBIN A1C: HEMOGLOBIN A1C: 6 % (ref 4.6–6.5)

## 2013-10-20 MED ORDER — OXYCODONE-ACETAMINOPHEN 5-325 MG PO TABS: ORAL_TABLET | ORAL | Status: DC

## 2013-10-20 NOTE — Progress Notes (Signed)
OFFICE NOTE  10/23/2013  CC:  Chief Complaint  Patient presents with  . Follow-up  . Leg Pain   HPI: Patient is a 68 y.o. Caucasian female who is here for 2 and 1/2 mo f/u DM 2, HTN, tob dependence, knee osteoarthritis.   Has not been taking her Toprol XL since 09/2013 due to confusion but bp checks at home have been normal, only occ high systolic but no low bps.    Glucoses 80s to 130s: some fasting and some 2H PP checks. Still smoking cigs: approx 1 and 1/2 packs/day and trying to cut back.  She reports that her oncologist told her that her persistent leukocytosis is NOT CANCER.  She feels good about this.  Says her orthopedist told her that he would not operate on her right knee, says he recommended she return to me for pain med tx.  Says her pain med requirement is no more than 4 percocet 5/325 tabs per day.   Most recent rx for this med was for #30 of these from Dr. Berenice Primas, says she ran out 4d ago. She is agreeable to referral to pain mgmt clinic for management of her chronic knee and neck pain.    Pertinent PMH:  Past Medical History  Diagnosis Date  . Obesity   . Seizure   . Skin cancer   . Asthma   . GERD (gastroesophageal reflux disease)   . Hypertension   . Tobacco user   . CAD (coronary artery disease)     Moderate disease of the left circumflex managed medically  . Fibromyalgia   . Chronic pain   . Dyslipidemia   . Diverticul disease small and large intestine, no perforati or abscess   . Gastroparesis   . Overactive bladder   . Anxiety   . Hyperlipidemia   . Urge and stress incontinence   . Chronic cystitis   . Rectal prolapse   . Neurotic excoriations 01/28/2013  . Leukocytosis     chronic, mild, s/p eval 03/2013 by hematologist --reassured; likely secondary to ongoing smoking  . Type II or unspecified type diabetes mellitus with neurological manifestations, not stated as uncontrolled(250.60) 08/17/2008    Hx of diabetic foot ulcer.  . Bipolar disorder      Psych hosp admission 08/2012--manic episode  . COPD (chronic obstructive pulmonary disease)   . Right knee pain Summer 2014    MRI 04/2013: large popliteal fossa ganglion cyst, mod medial compartment and patellofemoral osteoarth--referred to [facility] at that time (Dr. Berenice Primas)  . Muscle spasm     MEDS:  Outpatient Prescriptions Prior to Visit  Medication Sig Dispense Refill  . albuterol (PROVENTIL) (2.5 MG/3ML) 0.083% nebulizer solution Take 3 mLs (2.5 mg total) by nebulization every 6 (six) hours as needed for wheezing.  75 mL  6  . albuterol-ipratropium (COMBIVENT) 18-103 MCG/ACT inhaler Inhale 2 puffs into the lungs every 6 (six) hours as needed for wheezing.  1 Inhaler  3  . Alum & Mag Hydroxide-Simeth (MAGIC MOUTHWASH W/LIDOCAINE) SOLN 1 tsp po swish/gargle and spit four times per day as needed for mouth pain  200 mL  5  . amLODipine (NORVASC) 10 MG tablet Take 1 tablet (10 mg total) by mouth daily. For hypertension.  30 tablet  3  . aspirin 81 MG EC tablet Take 1 tablet (81 mg total) by mouth daily. For platelette aggregation.  30 tablet  0  . benazepril (LOTENSIN) 20 MG tablet Take 1 tablet (20 mg total) by mouth  daily. For hypertension.  30 tablet  3  . clonazePAM (KLONOPIN) 1 MG tablet Take 1 tablet (1 mg total) by mouth 2 (two) times daily as needed. anxiety  60 tablet  1  . diclofenac sodium (VOLTAREN) 1 % GEL Apply 2 g topically as needed.  100 g  3  . DULoxetine (CYMBALTA) 60 MG capsule Take 1 capsule (60 mg total) by mouth daily. For anxiety and depression.  30 capsule  6  . estradiol (ESTRACE) 0.1 MG/GM vaginal cream 9.98 applicators full qd  33.8 g  12  . fish oil-omega-3 fatty acids 1000 MG capsule Take 2 capsules (2 g total) by mouth daily. For lipid control.      . fluticasone (CUTIVATE) 0.05 % cream Apply to affected areas of skin twice daily as needed  60 g  3  . gabapentin (NEURONTIN) 100 MG capsule Take 2 capsules (200 mg total) by mouth at bedtime.  60 capsule  6   . glycopyrrolate (ROBINUL) 2 MG tablet Take 1 tablet (2 mg total) by mouth 3 (three) times daily as needed.  90 tablet  3  . lidocaine (LIDODERM) 5 % Place 3 patches onto the skin. Remove & Discard patch within 12 hours or as directed by MD      . nitroGLYCERIN (NITROSTAT) 0.4 MG SL tablet Place 1 tablet (0.4 mg total) under the tongue every 5 (five) minutes as needed for chest pain.  90 tablet  3  . omeprazole (PRILOSEC) 20 MG capsule Take 1 capsule (20 mg total) by mouth 2 (two) times daily.  60 capsule  3  . Oxybutynin Chloride (GELNIQUE) 10 % GEL Place 1 application onto the skin daily.  1 g  6  . pantoprazole (PROTONIX) 40 MG tablet Take 1 tablet (40 mg total) by mouth 2 (two) times daily. For reflux.      . pravastatin (PRAVACHOL) 80 MG tablet Take 1 tablet (80 mg total) by mouth daily.  30 tablet  2  . pregabalin (LYRICA) 225 MG capsule Take 1 capsule (225 mg total) by mouth 2 (two) times daily.  60 capsule  5  . tiZANidine (ZANAFLEX) 4 MG tablet Take 1 tablet (4 mg total) by mouth every 6 (six) hours as needed for muscle spasms.  90 tablet  3  . triamcinolone (NASACORT AQ) 55 MCG/ACT AERO nasal inhaler Place 2 sprays into the nose daily.  1 Inhaler  12  . glimepiride (AMARYL) 2 MG tablet May take 1 to 1 tabs po qAM for diabetes  60 tablet  3  . oxyCODONE-acetaminophen (PERCOCET/ROXICET) 5-325 MG per tablet 1-2 tabs po bid prn pain  120 tablet  0  . fexofenadine (ALLEGRA) 180 MG tablet Take 1 tablet (180 mg total) by mouth daily.  30 tablet  6  . metoprolol succinate (TOPROL XL) 50 MG 24 hr tablet Take 1 tablet (50 mg total) by mouth daily. Take with or immediately following a meal.  30 tablet  6  . predniSONE (DELTASONE) 20 MG tablet 2 tabs po qd x 5d, then 1 tab po qd x 5d  15 tablet  0  . promethazine (PHENERGAN) 12.5 MG tablet       . tiZANidine (ZANAFLEX) 4 MG tablet        No facility-administered medications prior to visit.    PE: Blood pressure 116/75, pulse 70, temperature 98  F (36.7 C), temperature source Temporal, resp. rate 22, height 5\' 4"  (1.626 m), weight 186 lb (84.369 kg), SpO2 96.00%.  Gen: Alert, well appearing.  Patient is oriented to person, place, time, and situation. No further exam today.  We spent all our time talking.  IMPRESSION AND PLAN:  1) Chronic pain syndrome: osteoarthritis of right knee and of C spine. Need Ortho records--will request these. I did do rx for percocet 5/325, 1-2 bid prn pain, #120--bridge rx until pain mgmt can start seeing her. Ordered referral to preferred pain mgmt today.  2) DM 2.  Control good per home monitoring.  Due for HbA1c today.     3) HTN: stable.  Discussed option of staying off metoprolol in future if bps low nl/symptomatic, otherwise needs to continue this med.  Lytes/Cr today.  4) Tob dependence: trying to cut back.  Not ready to completely quit.  Encouraged total cessation.  5) Preventative health care: prevnar 13 IM today. Ordered screening mammogram.  An After Visit Summary was printed and given to the patient.  FOLLOW UP: 4 mo

## 2013-10-20 NOTE — Progress Notes (Signed)
Pre visit review using our clinic review tool, if applicable. No additional management support is needed unless otherwise documented below in the visit note. 

## 2013-10-21 ENCOUNTER — Telehealth: Payer: Self-pay | Admitting: Family Medicine

## 2013-10-21 DIAGNOSIS — I10 Essential (primary) hypertension: Secondary | ICD-10-CM | POA: Insufficient documentation

## 2013-10-21 DIAGNOSIS — G894 Chronic pain syndrome: Secondary | ICD-10-CM | POA: Insufficient documentation

## 2013-10-21 NOTE — Telephone Encounter (Signed)
Relevant patient education mailed to patient.  

## 2013-10-22 ENCOUNTER — Telehealth: Payer: Self-pay | Admitting: Family Medicine

## 2013-10-22 DIAGNOSIS — Z79899 Other long term (current) drug therapy: Secondary | ICD-10-CM

## 2013-10-22 DIAGNOSIS — E1165 Type 2 diabetes mellitus with hyperglycemia: Principal | ICD-10-CM

## 2013-10-22 DIAGNOSIS — G40909 Epilepsy, unspecified, not intractable, without status epilepticus: Secondary | ICD-10-CM

## 2013-10-22 DIAGNOSIS — R569 Unspecified convulsions: Secondary | ICD-10-CM

## 2013-10-22 DIAGNOSIS — Z8659 Personal history of other mental and behavioral disorders: Secondary | ICD-10-CM

## 2013-10-22 DIAGNOSIS — IMO0001 Reserved for inherently not codable concepts without codable children: Secondary | ICD-10-CM

## 2013-10-22 DIAGNOSIS — I10 Essential (primary) hypertension: Secondary | ICD-10-CM

## 2013-10-22 DIAGNOSIS — Z7689 Persons encountering health services in other specified circumstances: Secondary | ICD-10-CM

## 2013-10-22 MED ORDER — GLIMEPIRIDE 2 MG PO TABS: ORAL_TABLET | ORAL | Status: DC

## 2013-10-22 NOTE — Telephone Encounter (Signed)
Pharmacy sent request for bystolic but I don't see in patient's chart as a current medication.  Please advise.

## 2013-10-22 NOTE — Telephone Encounter (Signed)
Spoke to Dr. Anitra Lauth.  Patient is to d/c bystolic and take metoprolol in its place.

## 2013-10-27 ENCOUNTER — Telehealth: Payer: Self-pay | Admitting: Family Medicine

## 2013-10-27 DIAGNOSIS — I10 Essential (primary) hypertension: Secondary | ICD-10-CM

## 2013-10-27 DIAGNOSIS — E1165 Type 2 diabetes mellitus with hyperglycemia: Principal | ICD-10-CM

## 2013-10-27 DIAGNOSIS — IMO0001 Reserved for inherently not codable concepts without codable children: Secondary | ICD-10-CM

## 2013-10-27 DIAGNOSIS — G40909 Epilepsy, unspecified, not intractable, without status epilepticus: Secondary | ICD-10-CM

## 2013-10-27 DIAGNOSIS — Z79899 Other long term (current) drug therapy: Secondary | ICD-10-CM

## 2013-10-27 DIAGNOSIS — Z7689 Persons encountering health services in other specified circumstances: Secondary | ICD-10-CM

## 2013-10-27 DIAGNOSIS — Z8659 Personal history of other mental and behavioral disorders: Secondary | ICD-10-CM

## 2013-10-27 DIAGNOSIS — R569 Unspecified convulsions: Secondary | ICD-10-CM

## 2013-10-27 MED ORDER — CLONAZEPAM 1 MG PO TABS: ORAL_TABLET | ORAL | Status: DC

## 2013-10-27 NOTE — Telephone Encounter (Signed)
Clonazepam rx printed. 

## 2013-10-27 NOTE — Telephone Encounter (Signed)
Patient requesting refill of clonazepam.  Patient last seen 10/20/13.  Last rx was 08/30/13 x 1 refill.  Please advise.

## 2013-10-27 NOTE — Telephone Encounter (Signed)
Rx faxed

## 2013-11-01 ENCOUNTER — Other Ambulatory Visit: Payer: Self-pay

## 2013-11-01 MED ORDER — PRAVASTATIN SODIUM 80 MG PO TABS: 80.0000 mg | ORAL_TABLET | Freq: Every day | ORAL | Status: DC

## 2013-11-03 ENCOUNTER — Other Ambulatory Visit: Payer: Self-pay

## 2013-11-03 ENCOUNTER — Ambulatory Visit: Payer: Medicare Other | Admitting: Family Medicine

## 2013-11-03 DIAGNOSIS — I10 Essential (primary) hypertension: Secondary | ICD-10-CM

## 2013-11-03 DIAGNOSIS — Z8659 Personal history of other mental and behavioral disorders: Secondary | ICD-10-CM

## 2013-11-03 DIAGNOSIS — E1165 Type 2 diabetes mellitus with hyperglycemia: Principal | ICD-10-CM

## 2013-11-03 DIAGNOSIS — R569 Unspecified convulsions: Secondary | ICD-10-CM

## 2013-11-03 DIAGNOSIS — Z79899 Other long term (current) drug therapy: Secondary | ICD-10-CM

## 2013-11-03 DIAGNOSIS — IMO0001 Reserved for inherently not codable concepts without codable children: Secondary | ICD-10-CM

## 2013-11-03 DIAGNOSIS — G40909 Epilepsy, unspecified, not intractable, without status epilepticus: Secondary | ICD-10-CM

## 2013-11-03 DIAGNOSIS — Z7689 Persons encountering health services in other specified circumstances: Secondary | ICD-10-CM

## 2013-11-03 MED ORDER — BENAZEPRIL HCL 20 MG PO TABS: 20.0000 mg | ORAL_TABLET | Freq: Every day | ORAL | Status: DC

## 2013-11-05 ENCOUNTER — Other Ambulatory Visit: Payer: Self-pay | Admitting: *Deleted

## 2013-11-05 DIAGNOSIS — R569 Unspecified convulsions: Secondary | ICD-10-CM

## 2013-11-05 DIAGNOSIS — E1165 Type 2 diabetes mellitus with hyperglycemia: Principal | ICD-10-CM

## 2013-11-05 DIAGNOSIS — Z8659 Personal history of other mental and behavioral disorders: Secondary | ICD-10-CM

## 2013-11-05 DIAGNOSIS — Z7689 Persons encountering health services in other specified circumstances: Secondary | ICD-10-CM

## 2013-11-05 DIAGNOSIS — G40909 Epilepsy, unspecified, not intractable, without status epilepticus: Secondary | ICD-10-CM

## 2013-11-05 DIAGNOSIS — IMO0001 Reserved for inherently not codable concepts without codable children: Secondary | ICD-10-CM

## 2013-11-05 DIAGNOSIS — Z79899 Other long term (current) drug therapy: Secondary | ICD-10-CM

## 2013-11-05 DIAGNOSIS — I10 Essential (primary) hypertension: Secondary | ICD-10-CM

## 2013-11-08 MED ORDER — DICLOFENAC SODIUM 1 % TD GEL: 2.0000 g | TRANSDERMAL | Status: DC | PRN

## 2013-11-08 NOTE — Telephone Encounter (Signed)
OK for RF as previously prescribed, 3 additional RF's-thx

## 2013-11-09 ENCOUNTER — Other Ambulatory Visit: Payer: Self-pay | Admitting: Family Medicine

## 2013-11-09 DIAGNOSIS — Z1231 Encounter for screening mammogram for malignant neoplasm of breast: Secondary | ICD-10-CM

## 2013-11-17 ENCOUNTER — Other Ambulatory Visit: Payer: Self-pay | Admitting: Family Medicine

## 2013-11-17 NOTE — Telephone Encounter (Signed)
As per review of last rx for this med, she cannot get a new rx for this until 01/13/14.-thx

## 2013-11-17 NOTE — Telephone Encounter (Signed)
Please advise refill.  Patient last seen 10/20/13.  Last rx was 09/15/13 x 3 refills.

## 2013-11-18 ENCOUNTER — Other Ambulatory Visit: Payer: Self-pay | Admitting: Family Medicine

## 2013-11-18 DIAGNOSIS — R569 Unspecified convulsions: Secondary | ICD-10-CM

## 2013-11-18 DIAGNOSIS — I10 Essential (primary) hypertension: Secondary | ICD-10-CM

## 2013-11-18 DIAGNOSIS — IMO0001 Reserved for inherently not codable concepts without codable children: Secondary | ICD-10-CM

## 2013-11-18 DIAGNOSIS — Z79899 Other long term (current) drug therapy: Secondary | ICD-10-CM

## 2013-11-18 DIAGNOSIS — Z7689 Persons encountering health services in other specified circumstances: Secondary | ICD-10-CM

## 2013-11-18 DIAGNOSIS — Z8659 Personal history of other mental and behavioral disorders: Secondary | ICD-10-CM

## 2013-11-18 DIAGNOSIS — G40909 Epilepsy, unspecified, not intractable, without status epilepticus: Secondary | ICD-10-CM

## 2013-11-18 DIAGNOSIS — E1165 Type 2 diabetes mellitus with hyperglycemia: Principal | ICD-10-CM

## 2013-11-18 NOTE — Telephone Encounter (Signed)
Per Dr. Anitra Lauth, he will not Rx this for pt.  Pt aware.

## 2013-11-18 NOTE — Telephone Encounter (Signed)
Patient requesting refill of her lidocaine patches.   Last time she got them filled was 01/13/13 that I could see.  Please advise refill.

## 2013-11-25 ENCOUNTER — Other Ambulatory Visit: Payer: Self-pay

## 2013-11-25 ENCOUNTER — Telehealth: Payer: Self-pay

## 2013-11-25 DIAGNOSIS — I10 Essential (primary) hypertension: Secondary | ICD-10-CM

## 2013-11-25 DIAGNOSIS — E1165 Type 2 diabetes mellitus with hyperglycemia: Principal | ICD-10-CM

## 2013-11-25 DIAGNOSIS — Z8659 Personal history of other mental and behavioral disorders: Secondary | ICD-10-CM

## 2013-11-25 DIAGNOSIS — IMO0001 Reserved for inherently not codable concepts without codable children: Secondary | ICD-10-CM

## 2013-11-25 DIAGNOSIS — Z79899 Other long term (current) drug therapy: Secondary | ICD-10-CM

## 2013-11-25 DIAGNOSIS — Z7689 Persons encountering health services in other specified circumstances: Secondary | ICD-10-CM

## 2013-11-25 DIAGNOSIS — G40909 Epilepsy, unspecified, not intractable, without status epilepticus: Secondary | ICD-10-CM

## 2013-11-25 DIAGNOSIS — R569 Unspecified convulsions: Secondary | ICD-10-CM

## 2013-11-25 MED ORDER — TIZANIDINE HCL 4 MG PO TABS: 4.0000 mg | ORAL_TABLET | Freq: Three times a day (TID) | ORAL | Status: DC

## 2013-11-25 MED ORDER — GLYCOPYRROLATE 2 MG PO TABS: 2.0000 mg | ORAL_TABLET | Freq: Three times a day (TID) | ORAL | Status: DC | PRN

## 2013-11-25 MED ORDER — AMLODIPINE BESYLATE 10 MG PO TABS: 10.0000 mg | ORAL_TABLET | Freq: Every day | ORAL | Status: DC

## 2013-11-25 NOTE — Telephone Encounter (Signed)
Last refill on Clonazepam 10/27/2013. Last office visit on 10/20/2013

## 2013-11-25 NOTE — Telephone Encounter (Signed)
Clonaz rx done 10/27/13 should still have 1 RF. I sent new rx for tizanidine and changed the instructions to take tid prn, so #90 will be a 1 mo supply, 3 RF's given.

## 2013-11-25 NOTE — Telephone Encounter (Signed)
[name] refills on Clonazepam 1 mg #60  and Tizanidine 4 mg #90. Last refill on 09/15/2013

## 2013-11-25 NOTE — Telephone Encounter (Signed)
Spoke with pharmacy, pt would like them to send a refill request when she pickes up each Rx so it can be ready for the next refill. She does have one refill of Klonopin left. No Rx needs to be sent in at this time.

## 2013-12-15 ENCOUNTER — Telehealth: Payer: Self-pay | Admitting: Family Medicine

## 2013-12-15 NOTE — Telephone Encounter (Signed)
Patient is requesting to go back on Bystolic. She states the current bp med she is on is making her feel dizzy & her pulse has been high.

## 2013-12-15 NOTE — Telephone Encounter (Signed)
Patient goes to [facility].

## 2013-12-15 NOTE — Telephone Encounter (Signed)
Please advise 

## 2013-12-16 NOTE — Telephone Encounter (Signed)
Patient coming into office tomorrow.

## 2013-12-16 NOTE — Telephone Encounter (Signed)
I need her to be more specific: what has her heart rate been (beats per minute when she usually checks it)?   Also, has she been checking her bp?  If so, what are some typical bp measurements she has been getting.   Let me know.--thx

## 2013-12-17 ENCOUNTER — Ambulatory Visit (INDEPENDENT_AMBULATORY_CARE_PROVIDER_SITE_OTHER): Payer: Medicare Other | Admitting: Family Medicine

## 2013-12-17 ENCOUNTER — Encounter: Payer: Self-pay | Admitting: Family Medicine

## 2013-12-17 VITALS — BP 108/68 | HR 77 | Temp 98.0°F | Resp 18 | Ht 64.0 in | Wt 181.0 lb

## 2013-12-17 DIAGNOSIS — R300 Dysuria: Secondary | ICD-10-CM

## 2013-12-17 DIAGNOSIS — R079 Chest pain, unspecified: Secondary | ICD-10-CM

## 2013-12-17 DIAGNOSIS — I959 Hypotension, unspecified: Secondary | ICD-10-CM

## 2013-12-17 DIAGNOSIS — R3129 Other microscopic hematuria: Secondary | ICD-10-CM

## 2013-12-17 LAB — BASIC METABOLIC PANEL
BUN: 24 mg/dL — AB (ref 6–23)
CALCIUM: 9.1 mg/dL (ref 8.4–10.5)
CO2: 24 meq/L (ref 19–32)
CREATININE: 1.15 mg/dL — AB (ref 0.50–1.10)
Chloride: 101 mEq/L (ref 96–112)
GLUCOSE: 102 mg/dL — AB (ref 70–99)
Potassium: 4 mEq/L (ref 3.5–5.3)
Sodium: 136 mEq/L (ref 135–145)

## 2013-12-17 LAB — CBC WITH DIFFERENTIAL/PLATELET
Basophils Absolute: 0 10*3/uL (ref 0.0–0.1)
Basophils Relative: 0 % (ref 0–1)
EOS ABS: 0.3 10*3/uL (ref 0.0–0.7)
EOS PCT: 2 % (ref 0–5)
HCT: 38 % (ref 36.0–46.0)
HEMOGLOBIN: 13.2 g/dL (ref 12.0–15.0)
LYMPHS ABS: 4.1 10*3/uL — AB (ref 0.7–4.0)
Lymphocytes Relative: 30 % (ref 12–46)
MCH: 29 pg (ref 26.0–34.0)
MCHC: 34.7 g/dL (ref 30.0–36.0)
MCV: 83.5 fL (ref 78.0–100.0)
MONO ABS: 0.8 10*3/uL (ref 0.1–1.0)
MONOS PCT: 6 % (ref 3–12)
NEUTROS PCT: 62 % (ref 43–77)
Neutro Abs: 8.4 10*3/uL — ABNORMAL HIGH (ref 1.7–7.7)
Platelets: 345 10*3/uL (ref 150–400)
RBC: 4.55 MIL/uL (ref 3.87–5.11)
RDW: 14.5 % (ref 11.5–15.5)
WBC: 13.6 10*3/uL — ABNORMAL HIGH (ref 4.0–10.5)

## 2013-12-17 LAB — POCT URINALYSIS DIPSTICK
Bilirubin, UA: NEGATIVE
Glucose, UA: NEGATIVE
Ketones, UA: NEGATIVE
Leukocytes, UA: NEGATIVE
Nitrite, UA: NEGATIVE
PH UA: 5.5
PROTEIN UA: NEGATIVE
SPEC GRAV UA: 1.02
UROBILINOGEN UA: 0.2

## 2013-12-17 LAB — D-DIMER, QUANTITATIVE (NOT AT ARMC): D-Dimer, Quant: 0.61 ug/mL-FEU — ABNORMAL HIGH (ref 0.00–0.48)

## 2013-12-17 MED ORDER — NITROFURANTOIN MONOHYD MACRO 100 MG PO CAPS: 100.0000 mg | ORAL_CAPSULE | Freq: Two times a day (BID) | ORAL | Status: DC

## 2013-12-17 NOTE — Progress Notes (Signed)
Pre visit review using our clinic review tool, if applicable. No additional management support is needed unless otherwise documented below in the visit note. 

## 2013-12-17 NOTE — Patient Instructions (Signed)
Stop your metoprolol, amlodipine, and lotensin. Check your blood pressure and heart rate 1-2 times per day and write these numbers down. Check these numbers after you have rested in a quiet room for 15 minutes. Bring your blood pressure and heart rate numbers to office visit in 10 days.
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
## 2013-12-18 LAB — URINE CULTURE
COLONY COUNT: NO GROWTH
Organism ID, Bacteria: NO GROWTH

## 2013-12-20 ENCOUNTER — Other Ambulatory Visit: Payer: Self-pay | Admitting: Family Medicine

## 2013-12-20 MED ORDER — CARVEDILOL 12.5 MG PO TABS: ORAL_TABLET | ORAL | Status: DC

## 2013-12-23 ENCOUNTER — Other Ambulatory Visit: Payer: Self-pay

## 2013-12-23 ENCOUNTER — Telehealth: Payer: Self-pay | Admitting: Family Medicine

## 2013-12-23 DIAGNOSIS — Z8659 Personal history of other mental and behavioral disorders: Secondary | ICD-10-CM

## 2013-12-23 DIAGNOSIS — I10 Essential (primary) hypertension: Secondary | ICD-10-CM

## 2013-12-23 DIAGNOSIS — R569 Unspecified convulsions: Secondary | ICD-10-CM

## 2013-12-23 DIAGNOSIS — G40909 Epilepsy, unspecified, not intractable, without status epilepticus: Secondary | ICD-10-CM

## 2013-12-23 DIAGNOSIS — Z7689 Persons encountering health services in other specified circumstances: Secondary | ICD-10-CM

## 2013-12-23 DIAGNOSIS — IMO0001 Reserved for inherently not codable concepts without codable children: Secondary | ICD-10-CM

## 2013-12-23 DIAGNOSIS — Z79899 Other long term (current) drug therapy: Secondary | ICD-10-CM

## 2013-12-23 DIAGNOSIS — E1165 Type 2 diabetes mellitus with hyperglycemia: Principal | ICD-10-CM

## 2013-12-23 MED ORDER — OMEPRAZOLE 20 MG PO CPDR: 20.0000 mg | DELAYED_RELEASE_CAPSULE | Freq: Two times a day (BID) | ORAL | Status: DC

## 2013-12-23 MED ORDER — CLONAZEPAM 1 MG PO TABS: ORAL_TABLET | ORAL | Status: DC

## 2013-12-23 NOTE — Telephone Encounter (Signed)
Patient requesting refill of her clonazepam.  Patient last seen 12/17/13.  Last rx was pritned 10/27/13 x 1 refill.  Please advise refill.

## 2013-12-23 NOTE — Telephone Encounter (Signed)
Clonaz rx printed. 

## 2013-12-24 NOTE — Telephone Encounter (Signed)
Rx faxed with confirmation.

## 2013-12-27 ENCOUNTER — Ambulatory Visit: Payer: Medicare Other | Admitting: Family Medicine

## 2013-12-30 ENCOUNTER — Ambulatory Visit: Payer: Self-pay | Admitting: Family Medicine

## 2013-12-31 ENCOUNTER — Ambulatory Visit (INDEPENDENT_AMBULATORY_CARE_PROVIDER_SITE_OTHER): Payer: Medicare Other | Admitting: Family Medicine

## 2013-12-31 ENCOUNTER — Encounter: Payer: Self-pay | Admitting: Family Medicine

## 2013-12-31 VITALS — BP 131/77 | HR 73 | Temp 98.1°F | Resp 18 | Ht 61.0 in | Wt 180.0 lb

## 2013-12-31 DIAGNOSIS — I10 Essential (primary) hypertension: Secondary | ICD-10-CM

## 2013-12-31 DIAGNOSIS — R7989 Other specified abnormal findings of blood chemistry: Secondary | ICD-10-CM

## 2013-12-31 DIAGNOSIS — R799 Abnormal finding of blood chemistry, unspecified: Secondary | ICD-10-CM

## 2013-12-31 MED ORDER — CARVEDILOL 12.5 MG PO TABS: ORAL_TABLET | ORAL | Status: DC

## 2013-12-31 NOTE — Progress Notes (Signed)
Pre visit review using our clinic review tool, if applicable. No additional management support is needed unless otherwise documented below in the visit note. 

## 2013-12-31 NOTE — Patient Instructions (Signed)
Cut your coreg 12.5mg  tab in half and take twice daily. Restart your amlodipine 10mg  once daily.  Don't restart your benazepril (Lotensin) until further instructions from me.

## 2013-12-31 NOTE — Progress Notes (Signed)
OFFICE NOTE  12/31/2013  CC:  Chief Complaint  Patient presents with  . Follow-up    HPI: Patient is a 68 y.o. Caucasian female who is here for 2 wk f/u low bp, recently took her off bp meds b/c of unexplained low bp's. Some chest pain lately, d-dimer just a tad elevated and I think this was related to her recent mild prerenal azotemia at the time of that blood draw.  I chose to NOT pursue CT chest at that time.  Due for repeat BMET. BPs at home had begun to go up again so I restarted her coreg 12.5mg  bid last visit. Review of home bp's today 120s-160s over 77-103, HR62-87.  Has had some dizziness upon getting up.  Says chest pain has gotten better and she has more of an appetite and is drinking more water. Breathing feels better but she admits the 2nd dose of coreg is often followed by a little more trouble breathing.  Denies wheezing but feels like getting a deep breath is harder and feels mild chest tightness.   Pertinent PMH:  Past medical, surgical, social, and family history reviewed and no changes are noted since last office visit.  MEDS:  Outpatient Prescriptions Prior to Visit  Medication Sig Dispense Refill  . albuterol (PROVENTIL) (2.5 MG/3ML) 0.083% nebulizer solution Take 3 mLs (2.5 mg total) by nebulization every 6 (six) hours as needed for wheezing.  75 mL  6  . albuterol-ipratropium (COMBIVENT) 18-103 MCG/ACT inhaler Inhale 2 puffs into the lungs every 6 (six) hours as needed for wheezing.  1 Inhaler  3  . Alum & Mag Hydroxide-Simeth (MAGIC MOUTHWASH W/LIDOCAINE) SOLN 1 tsp po swish/gargle and spit four times per day as needed for mouth pain  200 mL  5  . aspirin 81 MG EC tablet Take 1 tablet (81 mg total) by mouth daily. For platelette aggregation.  30 tablet  0  . carvedilol (COREG) 12.5 MG tablet 1 tab po bid  60 tablet  1  . clonazePAM (KLONOPIN) 1 MG tablet 1 tab po bid prn anxiety  60 tablet  5  . diclofenac sodium (VOLTAREN) 1 % GEL Apply 2 g topically as needed.   100 g  3  . DULoxetine (CYMBALTA) 60 MG capsule Take 1 capsule (60 mg total) by mouth daily. For anxiety and depression.  30 capsule  6  . estradiol (ESTRACE) 0.1 MG/GM vaginal cream 6.96 applicators full qd  78.9 g  12  . fexofenadine (ALLEGRA) 180 MG tablet Take 1 tablet (180 mg total) by mouth daily.  30 tablet  6  . fish oil-omega-3 fatty acids 1000 MG capsule Take 2 capsules (2 g total) by mouth daily. For lipid control.      . fluticasone (CUTIVATE) 0.05 % cream Apply to affected areas of skin twice daily as needed  60 g  3  . gabapentin (NEURONTIN) 100 MG capsule Take 2 capsules (200 mg total) by mouth at bedtime.  60 capsule  6  . glimepiride (AMARYL) 2 MG tablet May take 1 to 1 tabs po qAM for diabetes  60 tablet  3  . glycopyrrolate (ROBINUL) 2 MG tablet Take 1 tablet (2 mg total) by mouth 3 (three) times daily as needed.  90 tablet  3  . lidocaine (LIDODERM) 5 % Place 3 patches onto the skin. Remove & Discard patch within 12 hours or as directed by MD      . nitrofurantoin, macrocrystal-monohydrate, (MACROBID) 100 MG capsule Take 1 capsule (  100 mg total) by mouth 2 (two) times daily.  6 capsule  0  . nitroGLYCERIN (NITROSTAT) 0.4 MG SL tablet Place 1 tablet (0.4 mg total) under the tongue every 5 (five) minutes as needed for chest pain.  90 tablet  3  . omeprazole (PRILOSEC) 20 MG capsule Take 1 capsule (20 mg total) by mouth 2 (two) times daily.  60 capsule  2  . Oxybutynin Chloride (GELNIQUE) 10 % GEL Place 1 application onto the skin daily.  1 g  6  . oxyCODONE-acetaminophen (PERCOCET/ROXICET) 5-325 MG per tablet 1-2 tabs po bid prn pain  120 tablet  0  . pravastatin (PRAVACHOL) 80 MG tablet Take 1 tablet (80 mg total) by mouth daily.  30 tablet  1  . pregabalin (LYRICA) 225 MG capsule Take 1 capsule (225 mg total) by mouth 2 (two) times daily.  60 capsule  5  . promethazine (PHENERGAN) 12.5 MG tablet       . tiZANidine (ZANAFLEX) 4 MG tablet Take 1 tablet (4 mg total) by mouth 3  (three) times daily.  90 tablet  3  . triamcinolone (NASACORT AQ) 55 MCG/ACT AERO nasal inhaler Place 2 sprays into the nose daily.  1 Inhaler  12  . amLODipine (NORVASC) 10 MG tablet Take 1 tablet (10 mg total) by mouth daily. For hypertension.  30 tablet  3  . benazepril (LOTENSIN) 20 MG tablet Take 1 tablet (20 mg total) by mouth daily. For hypertension.  30 tablet  3   No facility-administered medications prior to visit.    PE: Blood pressure 131/77, pulse 73, temperature 98.1 F (36.7 C), temperature source Temporal, resp. rate 18, height 5\' 1"  (1.549 m), weight 180 lb (81.647 kg), SpO2 98.00%. Gen: Alert, well appearing.  Patient is oriented to person, place, time, and situation. CV: RRR, no m/r/g.   LUNGS: CTA bilat, nonlabored resps, good aeration in all lung fields.  IMPRESSION AND PLAN:  1) HTN, erratic numbers but for the most part they are high.   Question of bronchospasm as part of her episodic dyspnea: will cut the beta blocker dose in half (1/2 of 12.5mg  tab bid) and will go ahead and restart amlodipine 10mg  qd. Monitor bp/hr at home. Two f/u bp's. Recheck bmet today.  An After Visit Summary was printed and given to the patient.  Spent 30 min with pt today, with >50% of this time spent in counseling and care coordination regarding the above problems.  FOLLOW UP: 2 wks

## 2014-01-01 LAB — BASIC METABOLIC PANEL
BUN: 5 mg/dL — AB (ref 6–23)
CALCIUM: 9.5 mg/dL (ref 8.4–10.5)
CO2: 28 meq/L (ref 19–32)
CREATININE: 0.75 mg/dL (ref 0.50–1.10)
Chloride: 101 mEq/L (ref 96–112)
GLUCOSE: 58 mg/dL — AB (ref 70–99)
Potassium: 4.7 mEq/L (ref 3.5–5.3)
Sodium: 136 mEq/L (ref 135–145)

## 2014-01-03 ENCOUNTER — Other Ambulatory Visit: Payer: Self-pay | Admitting: Pain Medicine

## 2014-01-03 DIAGNOSIS — M545 Low back pain, unspecified: Secondary | ICD-10-CM

## 2014-01-03 DIAGNOSIS — M542 Cervicalgia: Secondary | ICD-10-CM

## 2014-01-10 ENCOUNTER — Ambulatory Visit
Admission: RE | Admit: 2014-01-10 | Discharge: 2014-01-10 | Disposition: A | Discharge status: Home / Self Care | Payer: Medicare Other | Source: Ambulatory Visit | Attending: Pain Medicine | Admitting: Pain Medicine

## 2014-01-10 DIAGNOSIS — M545 Low back pain, unspecified: Secondary | ICD-10-CM

## 2014-01-10 DIAGNOSIS — M542 Cervicalgia: Secondary | ICD-10-CM

## 2014-01-13 ENCOUNTER — Ambulatory Visit (INDEPENDENT_AMBULATORY_CARE_PROVIDER_SITE_OTHER): Payer: Medicare Other | Admitting: Family Medicine

## 2014-01-13 ENCOUNTER — Encounter: Payer: Self-pay | Admitting: Family Medicine

## 2014-01-13 VITALS — BP 129/76 | HR 78 | Temp 97.4°F | Resp 18 | Ht 61.0 in | Wt 180.0 lb

## 2014-01-13 DIAGNOSIS — Z79899 Other long term (current) drug therapy: Secondary | ICD-10-CM

## 2014-01-13 DIAGNOSIS — R569 Unspecified convulsions: Secondary | ICD-10-CM

## 2014-01-13 DIAGNOSIS — R002 Palpitations: Secondary | ICD-10-CM

## 2014-01-13 DIAGNOSIS — IMO0001 Reserved for inherently not codable concepts without codable children: Secondary | ICD-10-CM

## 2014-01-13 DIAGNOSIS — Z7189 Other specified counseling: Secondary | ICD-10-CM

## 2014-01-13 DIAGNOSIS — E1165 Type 2 diabetes mellitus with hyperglycemia: Secondary | ICD-10-CM

## 2014-01-13 DIAGNOSIS — Z7689 Persons encountering health services in other specified circumstances: Secondary | ICD-10-CM

## 2014-01-13 DIAGNOSIS — I959 Hypotension, unspecified: Secondary | ICD-10-CM

## 2014-01-13 DIAGNOSIS — Z8659 Personal history of other mental and behavioral disorders: Secondary | ICD-10-CM

## 2014-01-13 DIAGNOSIS — G40909 Epilepsy, unspecified, not intractable, without status epilepticus: Secondary | ICD-10-CM

## 2014-01-13 DIAGNOSIS — I10 Essential (primary) hypertension: Secondary | ICD-10-CM

## 2014-01-13 MED ORDER — CLONAZEPAM 1 MG PO TABS: ORAL_TABLET | ORAL | Status: DC

## 2014-01-13 NOTE — Progress Notes (Signed)
Pre visit review using our clinic review tool, if applicable. No additional management support is needed unless otherwise documented below in the visit note. 

## 2014-01-13 NOTE — Progress Notes (Signed)
OFFICE NOTE  01/13/2014  CC:  Chief Complaint  Patient presents with  . Follow-up    HPI: Patient is a 68 y.o. Caucasian female who is here for 2 wk f/u HTN. Last o/v I recommended she restart amlodipine and cut her 12.5 mg coreg in half and take this bid (bronchospasm). Review of bp's from home show mostly normal, only occ high.  One low bp recently (80s/40s, HR 110) in which pt felt generalized fatigue and signif anxiety and some chest pain and palpitations/heart rate irregularity.  No presyncope or syncope.  Sx's lasted about 1/2 hour.  She had been doing housework when she noted the sx's.  Says she is under tremendous stress with moving, says she doesn't have a place to go right now.   She is taking out an insurance policy to try to help with this situation.  She says he anxiety is controlled except for a period in middle of the day---she asks for permission to take a dose of her clonazepam in mid day.   Pertinent PMH:  Past medical, surgical, social, and family history reviewed and no changes are noted since last office visit.  MEDS:  Outpatient Prescriptions Prior to Visit  Medication Sig Dispense Refill  . albuterol (PROVENTIL) (2.5 MG/3ML) 0.083% nebulizer solution Take 3 mLs (2.5 mg total) by nebulization every 6 (six) hours as needed for wheezing.  75 mL  6  . albuterol-ipratropium (COMBIVENT) 18-103 MCG/ACT inhaler Inhale 2 puffs into the lungs every 6 (six) hours as needed for wheezing.  1 Inhaler  3  . Alum & Mag Hydroxide-Simeth (MAGIC MOUTHWASH W/LIDOCAINE) SOLN 1 tsp po swish/gargle and spit four times per day as needed for mouth pain  200 mL  5  . amLODipine (NORVASC) 10 MG tablet Take 1 tablet (10 mg total) by mouth daily. For hypertension.  30 tablet  3  . aspirin 81 MG EC tablet Take 1 tablet (81 mg total) by mouth daily. For platelette aggregation.  30 tablet  0  . benazepril (LOTENSIN) 20 MG tablet Take 1 tablet (20 mg total) by mouth daily. For hypertension.  30  tablet  3  . carvedilol (COREG) 12.5 MG tablet 1/2 tab po bid  60 tablet  1  . clonazePAM (KLONOPIN) 1 MG tablet 1 tab po bid prn anxiety  60 tablet  5  . diclofenac sodium (VOLTAREN) 1 % GEL Apply 2 g topically as needed.  100 g  3  . DULoxetine (CYMBALTA) 60 MG capsule Take 1 capsule (60 mg total) by mouth daily. For anxiety and depression.  30 capsule  6  . estradiol (ESTRACE) 0.1 MG/GM vaginal cream 8.29 applicators full qd  93.7 g  12  . fexofenadine (ALLEGRA) 180 MG tablet Take 1 tablet (180 mg total) by mouth daily.  30 tablet  6  . fish oil-omega-3 fatty acids 1000 MG capsule Take 2 capsules (2 g total) by mouth daily. For lipid control.      . fluticasone (CUTIVATE) 0.05 % cream Apply to affected areas of skin twice daily as needed  60 g  3  . gabapentin (NEURONTIN) 100 MG capsule Take 2 capsules (200 mg total) by mouth at bedtime.  60 capsule  6  . glimepiride (AMARYL) 2 MG tablet May take 1 to 1 tabs po qAM for diabetes  60 tablet  3  . glycopyrrolate (ROBINUL) 2 MG tablet Take 1 tablet (2 mg total) by mouth 3 (three) times daily as needed.  90 tablet  3  . lidocaine (LIDODERM) 5 % Place 3 patches onto the skin. Remove & Discard patch within 12 hours or as directed by MD      . nitroGLYCERIN (NITROSTAT) 0.4 MG SL tablet Place 1 tablet (0.4 mg total) under the tongue every 5 (five) minutes as needed for chest pain.  90 tablet  3  . omeprazole (PRILOSEC) 20 MG capsule Take 1 capsule (20 mg total) by mouth 2 (two) times daily.  60 capsule  2  . Oxybutynin Chloride (GELNIQUE) 10 % GEL Place 1 application onto the skin daily.  1 g  6  . oxyCODONE-acetaminophen (PERCOCET/ROXICET) 5-325 MG per tablet 1-2 tabs po bid prn pain  120 tablet  0  . pravastatin (PRAVACHOL) 80 MG tablet Take 1 tablet (80 mg total) by mouth daily.  30 tablet  1  . pregabalin (LYRICA) 225 MG capsule Take 1 capsule (225 mg total) by mouth 2 (two) times daily.  60 capsule  5  . promethazine (PHENERGAN) 12.5 MG tablet        . tiZANidine (ZANAFLEX) 4 MG tablet Take 1 tablet (4 mg total) by mouth 3 (three) times daily.  90 tablet  3  . triamcinolone (NASACORT AQ) 55 MCG/ACT AERO nasal inhaler Place 2 sprays into the nose daily.  1 Inhaler  12   No facility-administered medications prior to visit.    PE: Blood pressure 129/76, pulse 78, temperature 97.4 F (36.3 C), temperature source Temporal, resp. rate 18, height 5\' 1"  (1.549 m), weight 180 lb (81.647 kg), SpO2 95.00%. Gen: Alert, well appearing.  Patient is oriented to person, place, time, and situation. Walking without a cane today. AFFECT: pleasant, lucid thought and speech. CV: RRR, no m/r/g.   LUNGS: CTA bilat, nonlabored resps, good aeration in all lung fields.  LABS:    Chemistry      Component Value Date/Time   NA 136 12/31/2013 1623   K 4.7 12/31/2013 1623   CL 101 12/31/2013 1623   CO2 28 12/31/2013 1623   BUN 5* 12/31/2013 1623   CREATININE 0.75 12/31/2013 1623   CREATININE 0.9 10/20/2013 1457      Component Value Date/Time   CALCIUM 9.5 12/31/2013 1623   ALKPHOS 57 10/20/2013 1457   AST 17 10/20/2013 1457   ALT 18 10/20/2013 1457   BILITOT 0.2* 10/20/2013 1457        IMPRESSION AND PLAN:  1) HTN: most of her measurements are at or near goal, but still having occ unexplained symptomatic hypotension.  Plan is to do an event monitor.  Continue amlodipine 10mg  qd and coreg 12.5mg  tab, 1/2 tab po bid.  Continue to HOLD lotensin at this time.  2) GAD: mid day poorly controlled.  OK to take clonazepam 1mg  tid instead of bid, new rx written and handed today to pt to reflect new sig and new monthly allotment.  Called her pharmacy and cancelled the refills on her current clonaz bid rx.  3) ARF: resolved--creatine at recheck last visit was normal.  She is hydrating much better now.  An After Visit Summary was printed and given to the patient.  Spent 30 min with pt today, with >50% of this time spent in counseling and care coordination regarding the above  problems.  FOLLOW UP: 6 wks

## 2014-01-14 ENCOUNTER — Telehealth: Payer: Self-pay | Admitting: Family Medicine

## 2014-01-14 NOTE — Telephone Encounter (Signed)
Relevant patient education mailed to patient.  

## 2014-01-20 ENCOUNTER — Encounter (INDEPENDENT_AMBULATORY_CARE_PROVIDER_SITE_OTHER): Payer: Medicare Other

## 2014-01-20 ENCOUNTER — Encounter: Payer: Self-pay | Admitting: *Deleted

## 2014-01-20 DIAGNOSIS — R002 Palpitations: Secondary | ICD-10-CM

## 2014-01-20 DIAGNOSIS — I959 Hypotension, unspecified: Secondary | ICD-10-CM

## 2014-01-20 NOTE — Progress Notes (Signed)
Patient ID: [name], female   DOB: 06/28/1946, 68 y.o.   MRN: [id number] E-Cardio verite 30 day cardiac event monitor applied.

## 2014-01-21 ENCOUNTER — Other Ambulatory Visit: Payer: Self-pay | Admitting: Family Medicine

## 2014-01-21 MED ORDER — PRAVASTATIN SODIUM 80 MG PO TABS: 80.0000 mg | ORAL_TABLET | Freq: Every day | ORAL | Status: DC

## 2014-02-04 ENCOUNTER — Other Ambulatory Visit: Payer: Self-pay

## 2014-02-04 MED ORDER — CARVEDILOL 12.5 MG PO TABS: ORAL_TABLET | ORAL | Status: DC

## 2014-02-08 ENCOUNTER — Other Ambulatory Visit: Payer: Self-pay | Admitting: Family Medicine

## 2014-02-08 MED ORDER — FEXOFENADINE HCL 180 MG PO TABS: 180.0000 mg | ORAL_TABLET | Freq: Every day | ORAL | Status: DC

## 2014-02-10 ENCOUNTER — Other Ambulatory Visit: Payer: Self-pay | Admitting: Family Medicine

## 2014-02-10 DIAGNOSIS — R569 Unspecified convulsions: Secondary | ICD-10-CM

## 2014-02-10 DIAGNOSIS — Z7689 Persons encountering health services in other specified circumstances: Secondary | ICD-10-CM

## 2014-02-10 DIAGNOSIS — G40909 Epilepsy, unspecified, not intractable, without status epilepticus: Secondary | ICD-10-CM

## 2014-02-10 DIAGNOSIS — IMO0001 Reserved for inherently not codable concepts without codable children: Secondary | ICD-10-CM

## 2014-02-10 DIAGNOSIS — E1165 Type 2 diabetes mellitus with hyperglycemia: Principal | ICD-10-CM

## 2014-02-10 DIAGNOSIS — I10 Essential (primary) hypertension: Secondary | ICD-10-CM

## 2014-02-10 DIAGNOSIS — Z8659 Personal history of other mental and behavioral disorders: Secondary | ICD-10-CM

## 2014-02-10 DIAGNOSIS — Z79899 Other long term (current) drug therapy: Secondary | ICD-10-CM

## 2014-02-10 MED ORDER — ESTRADIOL 0.1 MG/GM VA CREA: TOPICAL_CREAM | VAGINAL | Status: DC

## 2014-02-10 MED ORDER — OXYBUTYNIN CHLORIDE 10 % TD GEL: 1.0000 | Freq: Every day | TRANSDERMAL | Status: DC

## 2014-02-10 MED ORDER — GABAPENTIN 100 MG PO CAPS: 200.0000 mg | ORAL_CAPSULE | Freq: Every day | ORAL | Status: DC

## 2014-02-10 NOTE — Telephone Encounter (Signed)
Pharmacy faxed rf request for gabapentin.  Last Rf was 04/30/13 x 6 rfs. Please advise rf of gabapentin, estrace, and gelnique.

## 2014-02-10 NOTE — Telephone Encounter (Signed)
rx's sent in x 6 rfs.

## 2014-02-10 NOTE — Telephone Encounter (Signed)
RF of all of these meds as previously prescribed is fine: may have 6 additional RFs of each.

## 2014-02-18 ENCOUNTER — Other Ambulatory Visit: Payer: Self-pay

## 2014-02-18 DIAGNOSIS — Z8659 Personal history of other mental and behavioral disorders: Secondary | ICD-10-CM

## 2014-02-18 DIAGNOSIS — G40909 Epilepsy, unspecified, not intractable, without status epilepticus: Secondary | ICD-10-CM

## 2014-02-18 DIAGNOSIS — IMO0001 Reserved for inherently not codable concepts without codable children: Secondary | ICD-10-CM

## 2014-02-18 DIAGNOSIS — R569 Unspecified convulsions: Secondary | ICD-10-CM

## 2014-02-18 DIAGNOSIS — Z7689 Persons encountering health services in other specified circumstances: Secondary | ICD-10-CM

## 2014-02-18 DIAGNOSIS — E1165 Type 2 diabetes mellitus with hyperglycemia: Principal | ICD-10-CM

## 2014-02-18 DIAGNOSIS — I10 Essential (primary) hypertension: Secondary | ICD-10-CM

## 2014-02-18 DIAGNOSIS — Z79899 Other long term (current) drug therapy: Secondary | ICD-10-CM

## 2014-02-18 MED ORDER — PRAVASTATIN SODIUM 80 MG PO TABS: 80.0000 mg | ORAL_TABLET | Freq: Every day | ORAL | Status: DC

## 2014-02-18 MED ORDER — GLIMEPIRIDE 2 MG PO TABS: ORAL_TABLET | ORAL | Status: DC

## 2014-02-20 ENCOUNTER — Encounter: Payer: Self-pay | Admitting: Family Medicine

## 2014-02-24 ENCOUNTER — Ambulatory Visit: Payer: Self-pay | Admitting: Family Medicine

## 2014-03-03 ENCOUNTER — Other Ambulatory Visit: Payer: Self-pay | Admitting: Family Medicine

## 2014-03-03 DIAGNOSIS — R569 Unspecified convulsions: Secondary | ICD-10-CM

## 2014-03-03 DIAGNOSIS — Z8659 Personal history of other mental and behavioral disorders: Secondary | ICD-10-CM

## 2014-03-03 DIAGNOSIS — IMO0001 Reserved for inherently not codable concepts without codable children: Secondary | ICD-10-CM

## 2014-03-03 DIAGNOSIS — Z79899 Other long term (current) drug therapy: Secondary | ICD-10-CM

## 2014-03-03 DIAGNOSIS — Z7689 Persons encountering health services in other specified circumstances: Secondary | ICD-10-CM

## 2014-03-03 DIAGNOSIS — E1165 Type 2 diabetes mellitus with hyperglycemia: Principal | ICD-10-CM

## 2014-03-03 DIAGNOSIS — G40909 Epilepsy, unspecified, not intractable, without status epilepticus: Secondary | ICD-10-CM

## 2014-03-03 DIAGNOSIS — I10 Essential (primary) hypertension: Secondary | ICD-10-CM

## 2014-03-03 MED ORDER — DULOXETINE HCL 60 MG PO CPEP: 60.0000 mg | ORAL_CAPSULE | Freq: Every day | ORAL | Status: DC

## 2014-03-03 MED ORDER — CARVEDILOL 12.5 MG PO TABS: ORAL_TABLET | ORAL | Status: DC

## 2014-03-09 ENCOUNTER — Ambulatory Visit: Payer: Medicare Other | Admitting: Family Medicine

## 2014-03-24 ENCOUNTER — Encounter: Payer: Self-pay | Admitting: Family Medicine

## 2014-03-24 ENCOUNTER — Ambulatory Visit (INDEPENDENT_AMBULATORY_CARE_PROVIDER_SITE_OTHER): Payer: Medicare Other | Admitting: Family Medicine

## 2014-03-24 ENCOUNTER — Ambulatory Visit: Payer: Self-pay | Admitting: Family Medicine

## 2014-03-24 VITALS — BP 119/79 | HR 90 | Temp 97.9°F | Resp 18 | Ht 61.0 in | Wt 169.0 lb

## 2014-03-24 DIAGNOSIS — R300 Dysuria: Secondary | ICD-10-CM

## 2014-03-24 DIAGNOSIS — R110 Nausea: Secondary | ICD-10-CM | POA: Insufficient documentation

## 2014-03-24 DIAGNOSIS — E1149 Type 2 diabetes mellitus with other diabetic neurological complication: Secondary | ICD-10-CM

## 2014-03-24 DIAGNOSIS — E119 Type 2 diabetes mellitus without complications: Secondary | ICD-10-CM

## 2014-03-24 DIAGNOSIS — I10 Essential (primary) hypertension: Secondary | ICD-10-CM

## 2014-03-24 DIAGNOSIS — R002 Palpitations: Secondary | ICD-10-CM

## 2014-03-24 DIAGNOSIS — F411 Generalized anxiety disorder: Secondary | ICD-10-CM

## 2014-03-24 DIAGNOSIS — G894 Chronic pain syndrome: Secondary | ICD-10-CM

## 2014-03-24 LAB — POCT URINALYSIS DIPSTICK
GLUCOSE UA: NEGATIVE
Ketones, UA: 15
Leukocytes, UA: NEGATIVE
Nitrite, UA: NEGATIVE
Protein, UA: 30
RBC UA: NEGATIVE
SPEC GRAV UA: 1.015
Urobilinogen, UA: 0.2
pH, UA: 6

## 2014-03-24 LAB — MICROALBUMIN / CREATININE URINE RATIO
CREATININE, U: 279.3 mg/dL
MICROALB/CREAT RATIO: 1.3 mg/g (ref 0.0–30.0)
Microalb, Ur: 3.6 mg/dL — ABNORMAL HIGH (ref 0.0–1.9)

## 2014-03-24 MED ORDER — PROMETHAZINE HCL 12.5 MG PO TABS: ORAL_TABLET | ORAL | Status: DC

## 2014-03-24 MED ORDER — ALBUTEROL SULFATE (2.5 MG/3ML) 0.083% IN NEBU: 2.5000 mg | INHALATION_SOLUTION | Freq: Four times a day (QID) | RESPIRATORY_TRACT | Status: DC | PRN

## 2014-03-24 NOTE — Assessment & Plan Note (Signed)
Palp's with dizziness episodes---nonspecific. Recent event monitor results reassuring (NSR with occ PVCs). No med changes or further w/u at this time.
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
## 2014-03-24 NOTE — Progress Notes (Signed)
Pre visit review using our clinic review tool, if applicable. No additional management support is needed unless otherwise documented below in the visit note. 

## 2014-03-24 NOTE — Assessment & Plan Note (Addendum)
Urine microalb/cr today. Pt has hx of diabetic ulcer and requires custom shoes/inserts.

## 2014-03-24 NOTE — Assessment & Plan Note (Signed)
C-spine, L-spine, and right knee. Continue multimodal pain mgmt with [facility] [place]--plans for cervical spine injection are being made per pt.

## 2014-03-24 NOTE — Assessment & Plan Note (Signed)
Multifactorial: delayed gastric emptying, severe anxiety, polypharmacy. Phenergan rx RF 'd today.

## 2014-03-24 NOTE — Assessment & Plan Note (Signed)
Remains stable on coreg and amlodipine. She'll remain off of lotensin at this time.
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
## 2014-03-24 NOTE — Assessment & Plan Note (Signed)
UA today w/out sign of infection. Suspect this symptom is from her atrophic vaginitis.

## 2014-03-28 ENCOUNTER — Telehealth: Payer: Self-pay | Admitting: Family Medicine

## 2014-03-28 ENCOUNTER — Other Ambulatory Visit: Payer: Self-pay

## 2014-03-28 DIAGNOSIS — Z79899 Other long term (current) drug therapy: Secondary | ICD-10-CM

## 2014-03-28 DIAGNOSIS — Z7689 Persons encountering health services in other specified circumstances: Secondary | ICD-10-CM

## 2014-03-28 DIAGNOSIS — Z8659 Personal history of other mental and behavioral disorders: Secondary | ICD-10-CM

## 2014-03-28 DIAGNOSIS — R569 Unspecified convulsions: Secondary | ICD-10-CM

## 2014-03-28 DIAGNOSIS — IMO0001 Reserved for inherently not codable concepts without codable children: Secondary | ICD-10-CM

## 2014-03-28 DIAGNOSIS — I10 Essential (primary) hypertension: Secondary | ICD-10-CM

## 2014-03-28 DIAGNOSIS — E1165 Type 2 diabetes mellitus with hyperglycemia: Principal | ICD-10-CM

## 2014-03-28 DIAGNOSIS — G40909 Epilepsy, unspecified, not intractable, without status epilepticus: Secondary | ICD-10-CM

## 2014-03-28 MED ORDER — IPRATROPIUM-ALBUTEROL 18-103 MCG/ACT IN AERO: 2.0000 | INHALATION_SPRAY | Freq: Four times a day (QID) | RESPIRATORY_TRACT | Status: DC | PRN
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
## 2014-04-05 ENCOUNTER — Telehealth: Payer: Self-pay | Admitting: Family Medicine

## 2014-04-05 MED ORDER — PROMETHAZINE HCL 12.5 MG PO TABS: ORAL_TABLET | ORAL | Status: DC

## 2014-04-05 NOTE — Telephone Encounter (Signed)
Caller: [name]/Patient; Phone: [phone]; Reason for Call: [name] says she has spoken with [facility] about them denying her meds; says they have sent a fax to the office and said that Dr.  Ernestine Conrad needs to list all the meds that he feels like she needs, especially the Diastolic, Promethazine and Gelnique gel; she says that her current BP meds cause weakness and nausea and she never had a problem on the Diastolic; also says that she needs her meds written as a 35mo supply because it costs the same as 103mo and she needs to save money; wanted to say [place]!!!  Please call with any questions

## 2014-04-05 NOTE — Telephone Encounter (Signed)
Remind pt that her insurance will NOT pay for her bystolic, so I'm not writing a rx for this. Also, she needs to find out who the original prescriber of the gelnique was b/c I did not rx this and it is a med that is usually only rx'd after she has tried and failed several oral meds for incontinence--I suspect a urologist rx'd it and that will be the MD that needs to do any prior authorization/over-ride to get this med in the future. I'll do the rx as requested for her phenergan.

## 2014-04-07 NOTE — Telephone Encounter (Signed)
Tried to contact patient but her phone kept cutting up and I wasn't able to hear her.  Will try to call her again later.

## 2014-04-08 ENCOUNTER — Other Ambulatory Visit: Payer: Self-pay | Admitting: Family Medicine

## 2014-04-08 DIAGNOSIS — E1165 Type 2 diabetes mellitus with hyperglycemia: Principal | ICD-10-CM

## 2014-04-08 DIAGNOSIS — R569 Unspecified convulsions: Secondary | ICD-10-CM

## 2014-04-08 DIAGNOSIS — Z79899 Other long term (current) drug therapy: Secondary | ICD-10-CM

## 2014-04-08 DIAGNOSIS — Z8659 Personal history of other mental and behavioral disorders: Secondary | ICD-10-CM

## 2014-04-08 DIAGNOSIS — G40909 Epilepsy, unspecified, not intractable, without status epilepticus: Secondary | ICD-10-CM

## 2014-04-08 DIAGNOSIS — IMO0001 Reserved for inherently not codable concepts without codable children: Secondary | ICD-10-CM

## 2014-04-08 DIAGNOSIS — Z7689 Persons encountering health services in other specified circumstances: Secondary | ICD-10-CM

## 2014-04-08 DIAGNOSIS — I10 Essential (primary) hypertension: Secondary | ICD-10-CM

## 2014-04-08 MED ORDER — PRAVASTATIN SODIUM 80 MG PO TABS: 80.0000 mg | ORAL_TABLET | Freq: Every day | ORAL | Status: DC

## 2014-04-15 ENCOUNTER — Encounter: Payer: Self-pay | Admitting: Family Medicine

## 2014-04-15 DIAGNOSIS — Z8631 Personal history of diabetic foot ulcer: Secondary | ICD-10-CM | POA: Insufficient documentation

## 2014-04-15 NOTE — Telephone Encounter (Signed)
rx written for diabetic shoes as per pt request.

## 2014-04-15 NOTE — Telephone Encounter (Signed)
Spoke with pt, she is requesting Dr Anitra Lauth fax a RX for diabetic shoes. Fax it to [facility] in [place].

## 2014-04-15 NOTE — Telephone Encounter (Signed)
Rx faxed

## 2014-05-05 ENCOUNTER — Encounter: Payer: Self-pay | Admitting: Family Medicine

## 2014-05-13 ENCOUNTER — Other Ambulatory Visit: Payer: Self-pay

## 2014-05-13 DIAGNOSIS — Z79899 Other long term (current) drug therapy: Secondary | ICD-10-CM

## 2014-05-13 DIAGNOSIS — I10 Essential (primary) hypertension: Secondary | ICD-10-CM

## 2014-05-13 DIAGNOSIS — IMO0001 Reserved for inherently not codable concepts without codable children: Secondary | ICD-10-CM

## 2014-05-13 DIAGNOSIS — G40909 Epilepsy, unspecified, not intractable, without status epilepticus: Secondary | ICD-10-CM

## 2014-05-13 DIAGNOSIS — Z7689 Persons encountering health services in other specified circumstances: Secondary | ICD-10-CM

## 2014-05-13 DIAGNOSIS — Z8659 Personal history of other mental and behavioral disorders: Secondary | ICD-10-CM

## 2014-05-13 DIAGNOSIS — E1165 Type 2 diabetes mellitus with hyperglycemia: Principal | ICD-10-CM

## 2014-05-13 DIAGNOSIS — R569 Unspecified convulsions: Secondary | ICD-10-CM

## 2014-05-13 MED ORDER — AMLODIPINE BESYLATE 10 MG PO TABS: 10.0000 mg | ORAL_TABLET | Freq: Every day | ORAL | Status: DC

## 2014-06-17 ENCOUNTER — Ambulatory Visit: Payer: Medicare Other | Admitting: Family Medicine

## 2014-06-21 ENCOUNTER — Telehealth: Payer: Self-pay | Admitting: Family Medicine

## 2014-06-21 NOTE — Telephone Encounter (Signed)
Patient cancelled appointment by phone tree. LM to CB to rsch

## 2014-06-21 NOTE — Telephone Encounter (Signed)
Patient has called back to rsch

## 2014-06-23 ENCOUNTER — Telehealth: Payer: Self-pay | Admitting: Cardiovascular Disease

## 2014-06-23 ENCOUNTER — Ambulatory Visit: Payer: Medicare Other | Admitting: Family Medicine
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
## 2014-06-27 ENCOUNTER — Encounter: Payer: Self-pay | Admitting: Family Medicine

## 2014-06-27 ENCOUNTER — Ambulatory Visit (INDEPENDENT_AMBULATORY_CARE_PROVIDER_SITE_OTHER): Payer: Medicare Other | Admitting: Family Medicine

## 2014-06-27 ENCOUNTER — Other Ambulatory Visit: Payer: Self-pay | Admitting: Family Medicine

## 2014-06-27 VITALS — BP 132/73 | HR 89 | Temp 98.4°F | Ht 61.0 in | Wt 167.0 lb

## 2014-06-27 DIAGNOSIS — E1140 Type 2 diabetes mellitus with diabetic neuropathy, unspecified: Secondary | ICD-10-CM

## 2014-06-27 DIAGNOSIS — R609 Edema, unspecified: Secondary | ICD-10-CM

## 2014-06-27 DIAGNOSIS — I10 Essential (primary) hypertension: Secondary | ICD-10-CM

## 2014-06-27 DIAGNOSIS — Z23 Encounter for immunization: Secondary | ICD-10-CM

## 2014-06-27 LAB — COMPREHENSIVE METABOLIC PANEL
ALK PHOS: 64 U/L (ref 39–117)
ALT: 15 U/L (ref 0–35)
AST: 16 U/L (ref 0–37)
Albumin: 4 g/dL (ref 3.5–5.2)
BILIRUBIN TOTAL: 0.3 mg/dL (ref 0.2–1.2)
BUN: 7 mg/dL (ref 6–23)
CO2: 31 mEq/L (ref 19–32)
Calcium: 9.2 mg/dL (ref 8.4–10.5)
Chloride: 103 mEq/L (ref 96–112)
Creat: 0.7 mg/dL (ref 0.50–1.10)
Glucose, Bld: 81 mg/dL (ref 70–99)
Potassium: 3.4 mEq/L — ABNORMAL LOW (ref 3.5–5.3)
SODIUM: 140 meq/L (ref 135–145)
Total Protein: 6.8 g/dL (ref 6.0–8.3)

## 2014-06-27 NOTE — Progress Notes (Signed)
OFFICE NOTE  06/27/2014  CC:  Chief Complaint  Patient presents with  . Follow-up     HPI: Patient is a 68 y.o. Caucasian female who is here for routine 3 mo f/u DM 2, HTN, and chronic LE venous insufficiency edema.  Legs swelling: stable.  Not too good at elevating legs or eating low Na diet.  No skin breNo homeakdown or signif rash.  HTN: no home bp 's to report.  Compliant with meds w/out side effects.  No exercise.  Minimal heart healthy diet.  NO home glucoses to report.  Compliant with amaryl.  No hypoglycemic episodes.  Pertinent PMH:  Past medical, surgical, social, and family history reviewed and no changes are noted since last office visit.  MEDS:  Outpatient Prescriptions Prior to Visit  Medication Sig Dispense Refill  . albuterol (PROVENTIL) (2.5 MG/3ML) 0.083% nebulizer solution Take 3 mLs (2.5 mg total) by nebulization every 6 (six) hours as needed for wheezing.  75 mL  6  . albuterol-ipratropium (COMBIVENT) 18-103 MCG/ACT inhaler Inhale 2 puffs into the lungs every 6 (six) hours as needed for wheezing.  1 Inhaler  1  . Alum & Mag Hydroxide-Simeth (MAGIC MOUTHWASH W/LIDOCAINE) SOLN 1 tsp po swish/gargle and spit four times per day as needed for mouth pain  200 mL  5  . amLODipine (NORVASC) 10 MG tablet Take 1 tablet (10 mg total) by mouth daily. For hypertension.  30 tablet  3  . aspirin 81 MG EC tablet Take 1 tablet (81 mg total) by mouth daily. For platelette aggregation.  30 tablet  0  . carvedilol (COREG) 12.5 MG tablet 1/2 tab po bid  60 tablet  3  . clonazePAM (KLONOPIN) 1 MG tablet 1 tab po tid prn anxiety  90 tablet  5  . diclofenac sodium (VOLTAREN) 1 % GEL Apply 2 g topically as needed.  100 g  3  . DULoxetine (CYMBALTA) 60 MG capsule Take 1 capsule (60 mg total) by mouth daily. For anxiety and depression.  30 capsule  3  . estradiol (ESTRACE) 0.1 MG/GM vaginal cream 6.29 applicators full qd  52.8 g  6  . fish oil-omega-3 fatty acids 1000 MG capsule Take 2  capsules (2 g total) by mouth daily. For lipid control.      . fluticasone (CUTIVATE) 0.05 % cream Apply to affected areas of skin twice daily as needed  60 g  3  . glimepiride (AMARYL) 2 MG tablet May take 1 to 1 tabs po qAM for diabetes  60 tablet  1  . glycopyrrolate (ROBINUL) 2 MG tablet Take 1 tablet (2 mg total) by mouth 3 (three) times daily as needed.  90 tablet  3  . lidocaine (LIDODERM) 5 % Place 3 patches onto the skin. Remove & Discard patch within 12 hours or as directed by MD      . nitroGLYCERIN (NITROSTAT) 0.4 MG SL tablet Place 1 tablet (0.4 mg total) under the tongue every 5 (five) minutes as needed for chest pain.  90 tablet  3  . omeprazole (PRILOSEC) 20 MG capsule Take 1 capsule (20 mg total) by mouth 2 (two) times daily.  60 capsule  2  . oxyCODONE-acetaminophen (PERCOCET/ROXICET) 5-325 MG per tablet 1-2 tabs po bid prn pain  120 tablet  0  . pravastatin (PRAVACHOL) 80 MG tablet Take 1 tablet (80 mg total) by mouth daily.  30 tablet  3  . pregabalin (LYRICA) 225 MG capsule Take 1 capsule (225 mg total)  by mouth 2 (two) times daily.  60 capsule  5  . promethazine (PHENERGAN) 12.5 MG tablet 1 tab po qid prn nausea  360 tablet  3  . triamcinolone (NASACORT AQ) 55 MCG/ACT AERO nasal inhaler Place 2 sprays into the nose daily.  1 Inhaler  12  . gabapentin (NEURONTIN) 100 MG capsule Take 2 capsules (200 mg total) by mouth at bedtime.  60 capsule  6  . benazepril (LOTENSIN) 20 MG tablet Take 1 tablet (20 mg total) by mouth daily. For hypertension.  30 tablet  3  . fexofenadine (ALLEGRA) 180 MG tablet Take 1 tablet (180 mg total) by mouth daily.  30 tablet  6  . Oxybutynin Chloride (GELNIQUE) 10 % GEL Place 1 application onto the skin daily.  1 g  6  . tiZANidine (ZANAFLEX) 4 MG tablet Take 1 tablet (4 mg total) by mouth 3 (three) times daily.  90 tablet  3   No facility-administered medications prior to visit.    PE: Blood pressure 132/73, pulse 89, temperature 98.4 F (36.9 C),  temperature source Temporal, height 5\' 1"  (1.549 m), weight 167 lb (75.751 kg), SpO2 96.00%. Gen: Alert, well appearing.  Patient is oriented to person, place, time, and situation. LEGS: bilat 2+ pitting edema from just below the knees into ankles.  IMPRESSION AND PLAN:  1) DM 2. HbA1c, CMET today. Foot exam next f/u visit.  2) HTN; The current medical regimen is effective;  continue present plan and medications. Lytes/cr today.  3) Chronic lower extremity venous insufficiency edema: stable.   Emphasized low Na diet.  Flu vaccine IM today.  An After Visit Summary was printed and given to the patient.  FOLLOW UP: 4 mo

## 2014-06-27 NOTE — Progress Notes (Signed)
Pre visit review using our clinic review tool, if applicable. No additional management support is needed unless otherwise documented below in the visit note. 

## 2014-06-28 ENCOUNTER — Encounter: Payer: Self-pay | Admitting: Family Medicine

## 2014-06-28 LAB — HEMOGLOBIN A1C
HEMOGLOBIN A1C: 5.6 % (ref <5.7)
MEAN PLASMA GLUCOSE: 114 mg/dL (ref <117)

## 2014-07-07 ENCOUNTER — Encounter: Payer: Medicare Other | Admitting: Cardiovascular Disease

## 2014-07-08 ENCOUNTER — Inpatient Hospital Stay (HOSPITAL_COMMUNITY)
Admission: EM | Admit: 2014-07-08 | Discharge: 2014-07-10 | DRG: 918 | Disposition: A | Discharge status: Home Health | Payer: Medicare Other | Attending: Internal Medicine | Admitting: Internal Medicine

## 2014-07-08 ENCOUNTER — Emergency Department (HOSPITAL_COMMUNITY): Payer: Medicare Other

## 2014-07-08 ENCOUNTER — Encounter (HOSPITAL_COMMUNITY): Payer: Self-pay | Admitting: Emergency Medicine

## 2014-07-08 DIAGNOSIS — Z79891 Long term (current) use of opiate analgesic: Secondary | ICD-10-CM | POA: Diagnosis not present

## 2014-07-08 DIAGNOSIS — E860 Dehydration: Secondary | ICD-10-CM | POA: Diagnosis present

## 2014-07-08 DIAGNOSIS — G2581 Restless legs syndrome: Secondary | ICD-10-CM | POA: Diagnosis present

## 2014-07-08 DIAGNOSIS — M5030 Other cervical disc degeneration, unspecified cervical region: Secondary | ICD-10-CM | POA: Diagnosis present

## 2014-07-08 DIAGNOSIS — T428X1A Poisoning by antiparkinsonism drugs and other central muscle-tone depressants, accidental (unintentional), initial encounter: Secondary | ICD-10-CM | POA: Diagnosis not present

## 2014-07-08 DIAGNOSIS — F319 Bipolar disorder, unspecified: Secondary | ICD-10-CM | POA: Diagnosis present

## 2014-07-08 DIAGNOSIS — E876 Hypokalemia: Secondary | ICD-10-CM | POA: Diagnosis present

## 2014-07-08 DIAGNOSIS — E785 Hyperlipidemia, unspecified: Secondary | ICD-10-CM | POA: Diagnosis present

## 2014-07-08 DIAGNOSIS — Z85828 Personal history of other malignant neoplasm of skin: Secondary | ICD-10-CM

## 2014-07-08 DIAGNOSIS — J45909 Unspecified asthma, uncomplicated: Secondary | ICD-10-CM | POA: Diagnosis present

## 2014-07-08 DIAGNOSIS — G40909 Epilepsy, unspecified, not intractable, without status epilepticus: Secondary | ICD-10-CM

## 2014-07-08 DIAGNOSIS — F17210 Nicotine dependence, cigarettes, uncomplicated: Secondary | ICD-10-CM | POA: Diagnosis present

## 2014-07-08 DIAGNOSIS — R4182 Altered mental status, unspecified: Secondary | ICD-10-CM | POA: Diagnosis present

## 2014-07-08 DIAGNOSIS — M797 Fibromyalgia: Secondary | ICD-10-CM | POA: Diagnosis present

## 2014-07-08 DIAGNOSIS — E1149 Type 2 diabetes mellitus with other diabetic neurological complication: Secondary | ICD-10-CM | POA: Diagnosis present

## 2014-07-08 DIAGNOSIS — T426X1A Poisoning by other antiepileptic and sedative-hypnotic drugs, accidental (unintentional), initial encounter: Secondary | ICD-10-CM | POA: Diagnosis present

## 2014-07-08 DIAGNOSIS — G894 Chronic pain syndrome: Secondary | ICD-10-CM | POA: Diagnosis present

## 2014-07-08 DIAGNOSIS — F419 Anxiety disorder, unspecified: Secondary | ICD-10-CM | POA: Diagnosis present

## 2014-07-08 DIAGNOSIS — Z981 Arthrodesis status: Secondary | ICD-10-CM

## 2014-07-08 DIAGNOSIS — I2510 Atherosclerotic heart disease of native coronary artery without angina pectoris: Secondary | ICD-10-CM | POA: Diagnosis present

## 2014-07-08 DIAGNOSIS — Z833 Family history of diabetes mellitus: Secondary | ICD-10-CM | POA: Diagnosis not present

## 2014-07-08 DIAGNOSIS — Z7982 Long term (current) use of aspirin: Secondary | ICD-10-CM

## 2014-07-08 DIAGNOSIS — K219 Gastro-esophageal reflux disease without esophagitis: Secondary | ICD-10-CM | POA: Diagnosis present

## 2014-07-08 DIAGNOSIS — Z79899 Other long term (current) drug therapy: Secondary | ICD-10-CM

## 2014-07-08 DIAGNOSIS — Z8249 Family history of ischemic heart disease and other diseases of the circulatory system: Secondary | ICD-10-CM | POA: Diagnosis not present

## 2014-07-08 DIAGNOSIS — M5136 Other intervertebral disc degeneration, lumbar region: Secondary | ICD-10-CM | POA: Diagnosis present

## 2014-07-08 DIAGNOSIS — R079 Chest pain, unspecified: Secondary | ICD-10-CM | POA: Diagnosis present

## 2014-07-08 DIAGNOSIS — Z7689 Persons encountering health services in other specified circumstances: Secondary | ICD-10-CM

## 2014-07-08 DIAGNOSIS — Z7951 Long term (current) use of inhaled steroids: Secondary | ICD-10-CM

## 2014-07-08 DIAGNOSIS — F411 Generalized anxiety disorder: Secondary | ICD-10-CM | POA: Diagnosis present

## 2014-07-08 DIAGNOSIS — I10 Essential (primary) hypertension: Secondary | ICD-10-CM | POA: Diagnosis present

## 2014-07-08 DIAGNOSIS — F3110 Bipolar disorder, current episode manic without psychotic features, unspecified: Secondary | ICD-10-CM

## 2014-07-08 DIAGNOSIS — J449 Chronic obstructive pulmonary disease, unspecified: Secondary | ICD-10-CM | POA: Diagnosis present

## 2014-07-08 DIAGNOSIS — T391X1A Poisoning by 4-Aminophenol derivatives, accidental (unintentional), initial encounter: Secondary | ICD-10-CM | POA: Diagnosis present

## 2014-07-08 DIAGNOSIS — Z825 Family history of asthma and other chronic lower respiratory diseases: Secondary | ICD-10-CM | POA: Diagnosis not present

## 2014-07-08 LAB — RAPID URINE DRUG SCREEN, HOSP PERFORMED
Amphetamines: NOT DETECTED
Barbiturates: NOT DETECTED
Benzodiazepines: NOT DETECTED
Cocaine: NOT DETECTED
Opiates: NOT DETECTED
Tetrahydrocannabinol: NOT DETECTED

## 2014-07-08 LAB — URINALYSIS, ROUTINE W REFLEX MICROSCOPIC
Bilirubin Urine: NEGATIVE
GLUCOSE, UA: NEGATIVE mg/dL
Hgb urine dipstick: NEGATIVE
Ketones, ur: 15 mg/dL — AB
LEUKOCYTES UA: NEGATIVE
Nitrite: NEGATIVE
PROTEIN: NEGATIVE mg/dL
Specific Gravity, Urine: 1.015 (ref 1.005–1.030)
UROBILINOGEN UA: 0.2 mg/dL (ref 0.0–1.0)
pH: 6.5 (ref 5.0–8.0)

## 2014-07-08 LAB — CBC WITH DIFFERENTIAL/PLATELET
Basophils Absolute: 0.1 10*3/uL (ref 0.0–0.1)
Basophils Relative: 0 % (ref 0–1)
EOS PCT: 1 % (ref 0–5)
Eosinophils Absolute: 0.2 10*3/uL (ref 0.0–0.7)
HCT: 39.8 % (ref 36.0–46.0)
Hemoglobin: 13.6 g/dL (ref 12.0–15.0)
Lymphocytes Relative: 21 % (ref 12–46)
Lymphs Abs: 3.1 10*3/uL (ref 0.7–4.0)
MCH: 28.9 pg (ref 26.0–34.0)
MCHC: 34.2 g/dL (ref 30.0–36.0)
MCV: 84.7 fL (ref 78.0–100.0)
Monocytes Absolute: 0.7 10*3/uL (ref 0.1–1.0)
Monocytes Relative: 5 % (ref 3–12)
Neutro Abs: 11 10*3/uL — ABNORMAL HIGH (ref 1.7–7.7)
Neutrophils Relative %: 73 % (ref 43–77)
PLATELETS: 357 10*3/uL (ref 150–400)
RBC: 4.7 MIL/uL (ref 3.87–5.11)
RDW: 14.7 % (ref 11.5–15.5)
WBC: 15 10*3/uL — ABNORMAL HIGH (ref 4.0–10.5)

## 2014-07-08 LAB — COMPREHENSIVE METABOLIC PANEL
ALT: 10 U/L (ref 0–35)
ANION GAP: 13 (ref 5–15)
AST: 14 U/L (ref 0–37)
Albumin: 3.2 g/dL — ABNORMAL LOW (ref 3.5–5.2)
Alkaline Phosphatase: 76 U/L (ref 39–117)
BUN: 6 mg/dL (ref 6–23)
CO2: 31 mEq/L (ref 19–32)
CREATININE: 0.63 mg/dL (ref 0.50–1.10)
Calcium: 9.3 mg/dL (ref 8.4–10.5)
Chloride: 100 mEq/L (ref 96–112)
GFR calc non Af Amer: >90 mL/min (ref >90)
GLUCOSE: 105 mg/dL — AB (ref 70–99)
Potassium: 2.8 mEq/L — CL (ref 3.7–5.3)
SODIUM: 144 meq/L (ref 137–147)
TOTAL PROTEIN: 7.2 g/dL (ref 6.0–8.3)
Total Bilirubin: 0.5 mg/dL (ref 0.3–1.2)

## 2014-07-08 LAB — GLUCOSE, CAPILLARY: GLUCOSE-CAPILLARY: 94 mg/dL (ref 70–99)

## 2014-07-08 LAB — TROPONIN I

## 2014-07-08 LAB — ETHANOL

## 2014-07-08 MED ORDER — FLUTICASONE PROPIONATE 0.05 % EX CREA
1.0000 "application " | TOPICAL_CREAM | Freq: Two times a day (BID) | CUTANEOUS | Status: DC | PRN
  Filled 2014-07-08: qty 30

## 2014-07-08 MED ORDER — TRIAMCINOLONE ACETONIDE 55 MCG/ACT NA AERO
2.0000 | INHALATION_SPRAY | Freq: Every day | NASAL | Status: DC
  Filled 2014-07-08: qty 21.6

## 2014-07-08 MED ORDER — ESTRADIOL 0.1 MG/GM VA CREA
1.0000 | TOPICAL_CREAM | Freq: Every day | VAGINAL | Status: DC
  Filled 2014-07-08: qty 42.5

## 2014-07-08 MED ORDER — LORATADINE 10 MG PO TABS
10.0000 mg | ORAL_TABLET | Freq: Every day | ORAL | Status: DC
Start: 2014-07-08 — End: 2014-07-08

## 2014-07-08 MED ORDER — POTASSIUM CHLORIDE IN NACL 40-0.9 MEQ/L-% IV SOLN
INTRAVENOUS | Status: DC
  Administered 2014-07-08 – 2014-07-09 (×2): 100 mL/h via INTRAVENOUS
  Filled 2014-07-08 (×5): qty 1000

## 2014-07-08 MED ORDER — PREGABALIN 75 MG PO CAPS
225.0000 mg | ORAL_CAPSULE | Freq: Two times a day (BID) | ORAL | Status: DC
  Administered 2014-07-08 – 2014-07-09 (×2): 225 mg via ORAL
  Filled 2014-07-08 (×2): qty 3

## 2014-07-08 MED ORDER — PRAVASTATIN SODIUM 40 MG PO TABS
80.0000 mg | ORAL_TABLET | Freq: Every day | ORAL | Status: DC
  Administered 2014-07-08 – 2014-07-09 (×2): 80 mg via ORAL
  Filled 2014-07-08 (×2): qty 2

## 2014-07-08 MED ORDER — CLONAZEPAM 0.5 MG PO TABS
1.0000 mg | ORAL_TABLET | Freq: Three times a day (TID) | ORAL | Status: DC | PRN
  Administered 2014-07-09 (×2): 1 mg via ORAL
  Filled 2014-07-08 (×2): qty 2

## 2014-07-08 MED ORDER — BENAZEPRIL HCL 10 MG PO TABS
20.0000 mg | ORAL_TABLET | Freq: Every day | ORAL | Status: DC
  Administered 2014-07-09 – 2014-07-10 (×2): 20 mg via ORAL
  Filled 2014-07-08 (×2): qty 2

## 2014-07-08 MED ORDER — GLIMEPIRIDE 2 MG PO TABS
1.0000 mg | ORAL_TABLET | Freq: Every day | ORAL | Status: DC
  Administered 2014-07-09 – 2014-07-10 (×2): 1 mg via ORAL
  Filled 2014-07-08 (×5): qty 1

## 2014-07-08 MED ORDER — LIDOCAINE 5 % EX PTCH
3.0000 | MEDICATED_PATCH | CUTANEOUS | Status: DC
  Administered 2014-07-09: 3 via TRANSDERMAL
  Filled 2014-07-08 (×3): qty 3

## 2014-07-08 MED ORDER — IPRATROPIUM-ALBUTEROL 18-103 MCG/ACT IN AERO: 2.0000 | INHALATION_SPRAY | Freq: Four times a day (QID) | RESPIRATORY_TRACT | Status: DC | PRN

## 2014-07-08 MED ORDER — ONDANSETRON HCL 4 MG PO TABS: 4.0000 mg | ORAL_TABLET | Freq: Four times a day (QID) | ORAL | Status: DC | PRN

## 2014-07-08 MED ORDER — ALBUTEROL SULFATE (2.5 MG/3ML) 0.083% IN NEBU
5.0000 mg | INHALATION_SOLUTION | Freq: Once | RESPIRATORY_TRACT | Status: AC
  Administered 2014-07-08: 5 mg via RESPIRATORY_TRACT
  Filled 2014-07-08: qty 6

## 2014-07-08 MED ORDER — NITROGLYCERIN 0.4 MG SL SUBL: 0.4000 mg | SUBLINGUAL_TABLET | SUBLINGUAL | Status: DC | PRN

## 2014-07-08 MED ORDER — ESTRADIOL 0.1 MG/GM VA CREA
0.2500 | TOPICAL_CREAM | Freq: Every day | VAGINAL | Status: DC
  Filled 2014-07-08: qty 42.5

## 2014-07-08 MED ORDER — ASPIRIN EC 81 MG PO TBEC
81.0000 mg | DELAYED_RELEASE_TABLET | Freq: Every day | ORAL | Status: DC
  Administered 2014-07-08 – 2014-07-10 (×3): 81 mg via ORAL
  Filled 2014-07-08 (×3): qty 1

## 2014-07-08 MED ORDER — CARVEDILOL 3.125 MG PO TABS
6.2500 mg | ORAL_TABLET | Freq: Two times a day (BID) | ORAL | Status: DC
  Administered 2014-07-08 – 2014-07-10 (×4): 6.25 mg via ORAL
  Filled 2014-07-08 (×4): qty 2

## 2014-07-08 MED ORDER — PANTOPRAZOLE SODIUM 40 MG PO TBEC
40.0000 mg | DELAYED_RELEASE_TABLET | Freq: Every day | ORAL | Status: DC
  Administered 2014-07-08 – 2014-07-10 (×3): 40 mg via ORAL
  Filled 2014-07-08 (×3): qty 1

## 2014-07-08 MED ORDER — PANTOPRAZOLE SODIUM 40 MG PO TBEC: 40.0000 mg | DELAYED_RELEASE_TABLET | Freq: Every day | ORAL | Status: DC

## 2014-07-08 MED ORDER — GLYCOPYRROLATE 1 MG PO TABS
2.0000 mg | ORAL_TABLET | Freq: Three times a day (TID) | ORAL | Status: DC | PRN
  Filled 2014-07-08: qty 2

## 2014-07-08 MED ORDER — TIZANIDINE HCL 4 MG PO TABS
4.0000 mg | ORAL_TABLET | Freq: Three times a day (TID) | ORAL | Status: DC
  Administered 2014-07-08 – 2014-07-09 (×3): 4 mg via ORAL
  Filled 2014-07-08 (×3): qty 1

## 2014-07-08 MED ORDER — HEPARIN SODIUM (PORCINE) 5000 UNIT/ML IJ SOLN
5000.0000 [IU] | Freq: Three times a day (TID) | INTRAMUSCULAR | Status: DC
  Administered 2014-07-08 – 2014-07-10 (×6): 5000 [IU] via SUBCUTANEOUS
  Filled 2014-07-08 (×6): qty 1

## 2014-07-08 MED ORDER — OMEGA-3-ACID ETHYL ESTERS 1 G PO CAPS
2.0000 g | ORAL_CAPSULE | Freq: Every day | ORAL | Status: DC
  Administered 2014-07-09 – 2014-07-10 (×2): 2 g via ORAL
  Filled 2014-07-08 (×2): qty 2

## 2014-07-08 MED ORDER — DULOXETINE HCL 60 MG PO CPEP: 60.0000 mg | ORAL_CAPSULE | Freq: Every day | ORAL | Status: DC

## 2014-07-08 MED ORDER — GABAPENTIN 100 MG PO CAPS
200.0000 mg | ORAL_CAPSULE | Freq: Every day | ORAL | Status: DC
  Administered 2014-07-08 – 2014-07-09 (×2): 200 mg via ORAL
  Filled 2014-07-08 (×2): qty 2

## 2014-07-08 MED ORDER — DULOXETINE HCL 60 MG PO CPEP
60.0000 mg | ORAL_CAPSULE | Freq: Every day | ORAL | Status: DC
  Administered 2014-07-08 – 2014-07-10 (×3): 60 mg via ORAL
  Filled 2014-07-08 (×3): qty 1

## 2014-07-08 MED ORDER — SODIUM CHLORIDE 0.9 % IJ SOLN
3.0000 mL | Freq: Two times a day (BID) | INTRAMUSCULAR | Status: DC
  Administered 2014-07-09 – 2014-07-10 (×2): 3 mL via INTRAVENOUS

## 2014-07-08 MED ORDER — DICLOFENAC SODIUM 1 % TD GEL
2.0000 g | TRANSDERMAL | Status: DC | PRN
  Administered 2014-07-09 – 2014-07-10 (×2): 2 g via TOPICAL
  Filled 2014-07-08: qty 100

## 2014-07-08 MED ORDER — AMLODIPINE BESYLATE 5 MG PO TABS
10.0000 mg | ORAL_TABLET | Freq: Every day | ORAL | Status: DC
  Administered 2014-07-09 – 2014-07-10 (×2): 10 mg via ORAL
  Filled 2014-07-08 (×2): qty 2

## 2014-07-08 MED ORDER — ALBUTEROL SULFATE (2.5 MG/3ML) 0.083% IN NEBU: 2.5000 mg | INHALATION_SOLUTION | Freq: Four times a day (QID) | RESPIRATORY_TRACT | Status: DC | PRN

## 2014-07-08 MED ORDER — METHOCARBAMOL 500 MG PO TABS
500.0000 mg | ORAL_TABLET | Freq: Three times a day (TID) | ORAL | Status: DC
  Administered 2014-07-08 – 2014-07-09 (×3): 500 mg via ORAL
  Filled 2014-07-08 (×3): qty 1

## 2014-07-08 MED ORDER — METOPROLOL SUCCINATE ER 50 MG PO TB24
50.0000 mg | ORAL_TABLET | Freq: Every day | ORAL | Status: DC
  Administered 2014-07-08 – 2014-07-10 (×3): 50 mg via ORAL
  Filled 2014-07-08 (×3): qty 1

## 2014-07-08 MED ORDER — OXYBUTYNIN CHLORIDE 10 % TD GEL
1.0000 | Freq: Every day | TRANSDERMAL | Status: DC
  Filled 2014-07-08 (×3): qty 1

## 2014-07-08 MED ORDER — LORATADINE 10 MG PO TABS
10.0000 mg | ORAL_TABLET | Freq: Every day | ORAL | Status: DC
  Administered 2014-07-09 – 2014-07-10 (×2): 10 mg via ORAL
  Filled 2014-07-08 (×2): qty 1

## 2014-07-08 MED ORDER — IPRATROPIUM-ALBUTEROL 0.5-2.5 (3) MG/3ML IN SOLN: 3.0000 mL | Freq: Four times a day (QID) | RESPIRATORY_TRACT | Status: DC | PRN

## 2014-07-08 MED ORDER — ONDANSETRON HCL 4 MG/2ML IJ SOLN: 4.0000 mg | Freq: Four times a day (QID) | INTRAMUSCULAR | Status: DC | PRN
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
## 2014-07-08 NOTE — Progress Notes (Signed)
This encounter was created in error - please disregard.

## 2014-07-08 NOTE — ED Notes (Signed)
CRITICAL VALUE ALERT  Critical value received: Potassium  Date of notification:  07/08/2014  Time of notification: 9842  Critical value read back:Yes.    Nurse who received alert:  Eilene Ghazi  MD notified (1st page):  Alvino Chapel  Responding MD: Alvino Chapel  Time MD responded: [phone]

## 2014-07-08 NOTE — ED Provider Notes (Signed)
CSN: [id number]     Arrival date & time 07/08/14  1105 History   First MD Initiated Contact with Patient 07/08/14 1333    This chart was scribed for Doree Albee, MD by Terressa Koyanagi, ED Scribe. This patient was seen in room APA16A/APA16A and the patient's care was started at 1:48 PM. LEVEL 5 CAVEAT: AMS Chief Complaint  Patient presents with  . Altered Mental Status   The history is provided by the patient. No language interpreter was used.   PCP: Tammi Sou, MD HPI Comments: [name] is a 68 y.o. female, with medical Hx noted below, brought in by EMS, who presents to the Emergency Department for altered mental status. Pt denies any current pain.   Past Medical History  Diagnosis Date  . Obesity   . Seizure   . Skin cancer   . Asthma   . GERD (gastroesophageal reflux disease)   . Hypertension   . Tobacco user   . CAD (coronary artery disease)     Moderate disease of the left circumflex managed medically  . Fibromyalgia   . Chronic pain   . Diverticul disease small and large intestine, no perforati or abscess   . Gastroparesis     abnl gastric emptying scan 2010 (Dr. Deatra Ina)  . Anxiety   . Hyperlipidemia   . Urge and stress incontinence   . Chronic cystitis   . Rectal prolapse   . Neurotic excoriations 01/28/2013  . Leukocytosis     chronic, mild, s/p eval 03/2013 by hematologist --reassured; likely secondary to ongoing smoking  . Type II or unspecified type diabetes mellitus with neurological manifestations, not stated as uncontrolled(250.60) 08/17/2008    Hx of diabetic foot ulcer.  . Bipolar disorder     Psych hosp admission 08/2012--manic episode  . COPD (chronic obstructive pulmonary disease)   . Right knee pain Summer 2014    MRI 04/2013: large popliteal fossa ganglion cyst, mod medial compartment and patellofemoral osteoarth--referred to [facility] at that time (Dr. Berenice Primas)  . Muscle spasm   . DDD (degenerative disc disease), cervical   . DDD  (degenerative disc disease), lumbar     MRI 12/2013: right foraminal disc protrusion L4-5, mild facet hypertrophy L5-S1--gets ESI's by Dr. Vira Blanco   Past Surgical History  Procedure Laterality Date  . Appendectomy    . Abdominal hysterectomy    . Tubal ligation    . Vagiocele    . Bladder surgery    . Eye surgery    . Rectocele repair    . Cholecystectomy    . Right foot surgery    . Cosmetic ear surgery    . Anterior cervical decomp/discectomy fusion      C5-C7 levels  . Esophagogastroduodenoscopy  05/2011    Dr. Vashti Hey at [facility]--s/p dilatation, o/w normal exam.  . Colonoscopy w/ biopsies  05/2009    Dr. Frederick Peers (bx showed benign colonic mucosa)  . Colonoscopy w/ biopsies  09/2002    Dr. Frederick Peers (  . Cardiovascular stress test  2002; 12/2008; 03/2012    2002: adenosine myoview NORMAL.  2010-Dr. Crenshaw: NORMAL adenosine myoview, no sign of scar or ischemia, EF normal.  2013-Dr. Nishan: NORMAL Lexiscan stress test, normal LV fxn/EF, normal wall motion  . Cardiac catheterization  08/2007    nonobstructive dz except 75% obstruction in OM, EF 60%--med mgmt   Family History  Problem Relation Age of Onset  . Cancer Mother   . Heart attack Sister   .  Cancer Sister   . Asthma Sister   . Diabetes Brother   . Heart attack Brother   . Asthma Brother   . Heart disease Brother    History  Substance Use Topics  . Smoking status: Current Every Day Smoker -- 1.50 packs/day    Types: Cigarettes  . Smokeless tobacco: Never Used     Comment: Using E-cigarette also.  Marland Kitchen Alcohol Use: No   OB History    No data available     Review of Systems  Unable to perform ROS: Mental status change      Allergies  Dust mite extract; Januvia; Latex; Metformin; Metoclopramide hcl; Penicillins; and Sulfonamide derivatives  Home Medications   Prior to Admission medications   Medication Sig Start Date End Date Taking? Authorizing Provider   albuterol-ipratropium (COMBIVENT) 18-103 MCG/ACT inhaler Inhale 2 puffs into the lungs every 6 (six) hours as needed for wheezing. 03/28/14  Yes Tammi Sou, MD  amLODipine (NORVASC) 10 MG tablet Take 1 tablet (10 mg total) by mouth daily. For hypertension. 05/13/14  Yes Tammi Sou, MD  benazepril (LOTENSIN) 20 MG tablet Take 1 tablet (20 mg total) by mouth daily. For hypertension. 11/03/13  Yes Tammi Sou, MD  carvedilol (COREG) 12.5 MG tablet 1/2 tab po bid Patient taking differently: Take 6.25 mg by mouth 2 (two) times daily with a meal. 1/2 tab po bid 03/03/14  Yes Tammi Sou, MD  clonazePAM (KLONOPIN) 1 MG tablet 1 tab po tid prn anxiety Patient taking differently: Take 1 mg by mouth 3 (three) times daily as needed for anxiety. 1 tab po tid prn anxiety 01/13/14  Yes Tammi Sou, MD  diclofenac sodium (VOLTAREN) 1 % GEL Apply 2 g topically as needed. Patient taking differently: Apply 2 g topically as needed (pain).    Yes Tammi Sou, MD  DULoxetine (CYMBALTA) 60 MG capsule Take 1 capsule (60 mg total) by mouth daily. For anxiety and depression. 03/03/14  Yes Tammi Sou, MD  estradiol (ESTRACE) 0.1 MG/GM vaginal cream 0.53 applicators full qd 9/76/73  Yes Tammi Sou, MD  fluticasone (CUTIVATE) 0.05 % cream Apply to affected areas of skin twice daily as needed Patient taking differently: Apply 1 application topically 2 (two) times daily as needed (rash). Apply to affected areas of skin twice daily as needed 06/22/13  Yes Tammi Sou, MD  gabapentin (NEURONTIN) 100 MG capsule Take 200 mg by mouth at bedtime.  05/20/14  Yes Historical Provider, MD  glimepiride (AMARYL) 2 MG tablet May take 1 to 1 tabs po qAM for diabetes Patient taking differently: Take 1-2 mg by mouth daily with breakfast. May take 1 to 1 tabs po qAM for diabetes 02/18/14  Yes Tammi Sou, MD  lidocaine (LIDODERM) 5 % Place 3 patches onto the skin. Remove & Discard patch within 12 hours  or as directed by MD   Yes Historical Provider, MD  methocarbamol (ROBAXIN) 500 MG tablet Take 500 mg by mouth 3 (three) times daily.  06/15/14  Yes Historical Provider, MD  metoprolol succinate (TOPROL-XL) 50 MG 24 hr tablet Take 50 mg by mouth daily.  06/06/14  Yes Historical Provider, MD  omeprazole (PRILOSEC) 20 MG capsule Take 1 capsule (20 mg total) by mouth 2 (two) times daily. 12/23/13  Yes Tammi Sou, MD  Oxybutynin Chloride (GELNIQUE) 10 % GEL Place 1 application onto the skin daily. 02/10/14  Yes Tammi Sou, MD  pravastatin (PRAVACHOL) 80 MG  tablet Take 1 tablet (80 mg total) by mouth daily. 04/08/14  Yes Tammi Sou, MD  promethazine (PHENERGAN) 12.5 MG tablet 1 tab po qid prn nausea Patient taking differently: Take 12.5 mg by mouth 4 (four) times daily as needed for nausea or vomiting. 1 tab po qid prn nausea 04/05/14  Yes Tammi Sou, MD  albuterol (PROVENTIL) (2.5 MG/3ML) 0.083% nebulizer solution Take 3 mLs (2.5 mg total) by nebulization every 6 (six) hours as needed for wheezing. 03/24/14   Tammi Sou, MD  Alum & Mag Hydroxide-Simeth (MAGIC MOUTHWASH W/LIDOCAINE) SOLN 1 tsp po swish/gargle and spit four times per day as needed for mouth pain 01/14/13   Tammi Sou, MD  aspirin 81 MG EC tablet Take 1 tablet (81 mg total) by mouth daily. For platelette aggregation. 08/15/12   Nena Polio, PA-C  fexofenadine (ALLEGRA) 180 MG tablet Take 1 tablet (180 mg total) by mouth daily. 02/08/14   Tammi Sou, MD  fish oil-omega-3 fatty acids 1000 MG capsule Take 2 capsules (2 g total) by mouth daily. For lipid control. 08/15/12   Nena Polio, PA-C  Gabapentin Enacarbil (HORIZANT) 600 MG TBCR Take 600 mg by mouth daily.    Historical Provider, MD  glycopyrrolate (ROBINUL) 2 MG tablet Take 1 tablet (2 mg total) by mouth 3 (three) times daily as needed. 11/25/13   Tammi Sou, MD  nitroGLYCERIN (NITROSTAT) 0.4 MG SL tablet Place 1 tablet (0.4 mg total) under the  tongue every 5 (five) minutes as needed for chest pain. 12/02/12   Sherren Mocha, MD  oxyCODONE-acetaminophen (PERCOCET/ROXICET) 5-325 MG per tablet 1-2 tabs po bid prn pain Patient not taking: Reported on 07/08/2014 10/20/13   Tammi Sou, MD  pregabalin (LYRICA) 225 MG capsule Take 1 capsule (225 mg total) by mouth 2 (two) times daily. 07/21/13   Tammi Sou, MD  tiZANidine (ZANAFLEX) 4 MG tablet Take 1 tablet (4 mg total) by mouth 3 (three) times daily. 11/25/13   Tammi Sou, MD  triamcinolone (NASACORT AQ) 55 MCG/ACT AERO nasal inhaler Place 2 sprays into the nose daily. 08/20/13   Tammi Sou, MD   Triage Vitals: BP 142/63 mmHg  Pulse 94  Resp 18  SpO2 92% Physical Exam  Constitutional: She is oriented to person, place, and time. She appears well-developed and well-nourished. No distress.  Sleepy and uncooperative   HENT:  Head: Normocephalic and atraumatic.  Eyes: Conjunctivae and EOM are normal.  Neck: Neck supple. No tracheal deviation present.  Cardiovascular: Normal rate.   Pulmonary/Chest: Effort normal. No respiratory distress.  Abdominal: Soft. There is no tenderness.  Musculoskeletal: Normal range of motion. She exhibits edema (peripheral ).  Neurological: She is alert and oriented to person, place, and time.  Skin: Skin is warm and dry.  Nursing note and vitals reviewed.   ED Course  Procedures (including critical care time) Labs Review Labs Reviewed  CBC WITH DIFFERENTIAL - Abnormal; Notable for the following:    WBC 15.0 (*)    Neutro Abs 11.0 (*)    All other components within normal limits  COMPREHENSIVE METABOLIC PANEL - Abnormal; Notable for the following:    Potassium 2.8 (*)    Glucose, Bld 105 (*)    Albumin 3.2 (*)    All other components within normal limits  URINALYSIS, ROUTINE W REFLEX MICROSCOPIC - Abnormal; Notable for the following:    Ketones, ur 15 (*)    All other components within normal limits  ETHANOL  URINE RAPID DRUG  SCREEN (HOSP PERFORMED)    Imaging Review Dg Chest 2 View  07/08/2014   CLINICAL DATA:  Altered mental status.  EXAM: CHEST  2 VIEW  COMPARISON:  04/27/2012  FINDINGS: The heart size and mediastinal contours are within normal limits. Both lungs are clear. Previous cervical fusion. There is a chronic wedge compression fracture at L2.  IMPRESSION: No evidence for acute cardiopulmonary abnormality  Chronic L2 compression fracture.   Electronically Signed   By: Shon Hale M.D.   On: 07/08/2014 14:59     EKG Interpretation   Date/Time:  Friday July 08 2014 11:09:54 EST Ventricular Rate:  100 PR Interval:  172 QRS Duration: 93 QT Interval:  360 QTC Calculation: 464 R Axis:   77 Text Interpretation:  Sinus tachycardia Borderline T abnormalities,  anterior leads SINCE LAST TRACING HEART RATE HAS INCREASED Confirmed by  Winfred Leeds  MD, SAM [id number]) on 07/08/2014 11:48:40 AM      MDM   Final diagnoses:  Altered mental status  Hypokalemia    Patient with altered mental status. Some confusion. Has had some bizarre statements. Previous records reviewed and does have bipolar history, no evidence of trauma or head. Mild hypokalemia. Will admit to internal medicine for further evaluation  I personally performed the services described in this documentation, which was scribed in my presence. The recorded information has been reviewed and is accurate.     Jasper Riling. Alvino Chapel, MD 07/08/14 [phone]

## 2014-07-08 NOTE — ED Notes (Signed)
Pt refuses foley catheter. States she is a virgin and it is "her matrimonial religious right" not to have one. Asked pt if she could give [place] a urine specimen. Stated she could but she was unable to. Dr Alvino Chapel notified.

## 2014-07-08 NOTE — H&P (Signed)
[facility] History and Physical  [name] VVZ:[id number] DOB: Dec 12, 1945 DOA: 07/08/2014  Referring physician: ER PCP: Tammi Sou, MD   Chief Complaint: weakness, altered mental status  HPI: [name] is a 68 y.o. female  This is a 68 year old lady, who has bipolar disorder, who has multitude of complaints, such as pain in her legs and chest pain. However, when she came to the emergency room, her mentation was altered. There is no clear source of this. She has had no nausea or vomiting or abdominal pain. Evaluation in the emergency room shows that she has hypokalemia and still remains somewhat in an altered mental status. She is now being admitted for further management.   Review of Systems:  Constitutional:  No weight loss, night sweats, Fevers, chills, fatigue.  HEENT:  No headaches, Difficulty swallowing,Tooth/dental problems,Sore throat,  No sneezing, itching, ear ache, nasal congestion, post nasal drip,   GI:  No heartburn, indigestion, abdominal pain, nausea, vomiting, diarrhea, change in bowel habits, loss of appetite  Resp:  No shortness of breath with exertion or at rest. No excess mucus, no productive cough, No non-productive cough, No coughing up of blood.No change in color of mucus.No wheezing.No chest wall deformity  Skin:  no rash or lesions.  GU:  no dysuria, change in color of urine, no urgency or frequency. No flank pain.  Musculoskeletal:  No joint pain or swelling. No decreased range of motion. No back pain.  Psych:  No change in mood or affect. No depression or anxiety. No memory loss.   Past Medical History  Diagnosis Date  . Obesity   . Seizure   . Skin cancer   . Asthma   . GERD (gastroesophageal reflux disease)   . Hypertension   . Tobacco user   . CAD (coronary artery disease)     Moderate disease of the left circumflex managed medically  . Fibromyalgia   . Chronic pain   . Diverticul disease small and large  intestine, no perforati or abscess   . Gastroparesis     abnl gastric emptying scan 2010 (Dr. Deatra Ina)  . Anxiety   . Hyperlipidemia   . Urge and stress incontinence   . Chronic cystitis   . Rectal prolapse   . Neurotic excoriations 01/28/2013  . Leukocytosis     chronic, mild, s/p eval 03/2013 by hematologist --reassured; likely secondary to ongoing smoking  . Type II or unspecified type diabetes mellitus with neurological manifestations, not stated as uncontrolled(250.60) 08/17/2008    Hx of diabetic foot ulcer.  . Bipolar disorder     Psych hosp admission 08/2012--manic episode  . COPD (chronic obstructive pulmonary disease)   . Right knee pain Summer 2014    MRI 04/2013: large popliteal fossa ganglion cyst, mod medial compartment and patellofemoral osteoarth--referred to [facility] at that time (Dr. Berenice Primas)  . Muscle spasm   . DDD (degenerative disc disease), cervical   . DDD (degenerative disc disease), lumbar     MRI 12/2013: right foraminal disc protrusion L4-5, mild facet hypertrophy L5-S1--gets ESI's by Dr. Vira Blanco   Past Surgical History  Procedure Laterality Date  . Appendectomy    . Abdominal hysterectomy    . Tubal ligation    . Vagiocele    . Bladder surgery    . Eye surgery    . Rectocele repair    . Cholecystectomy    . Right foot surgery    . Cosmetic ear surgery    . Anterior cervical  decomp/discectomy fusion      C5-C7 levels  . Esophagogastroduodenoscopy  05/2011    Dr. Vashti Hey at [facility]--s/p dilatation, o/w normal exam.  . Colonoscopy w/ biopsies  05/2009    Dr. Frederick Peers (bx showed benign colonic mucosa)  . Colonoscopy w/ biopsies  09/2002    Dr. Frederick Peers (  . Cardiovascular stress test  2002; 12/2008; 03/2012    2002: adenosine myoview NORMAL.  2010-Dr. Crenshaw: NORMAL adenosine myoview, no sign of scar or ischemia, EF normal.  2013-Dr. Nishan: NORMAL Lexiscan stress test, normal LV fxn/EF, normal wall motion  .  Cardiac catheterization  08/2007    nonobstructive dz except 75% obstruction in OM, EF 60%--med mgmt   Social History:  reports that she has been smoking Cigarettes.  She has been smoking about 1.50 packs per day. She has never used smokeless tobacco. She reports that she does not drink alcohol or use illicit drugs.  Allergies  Allergen Reactions  . Dust Mite Extract   . Januvia [Sitagliptin Phosphate] Nausea Only  . Latex   . Metformin     REACTION: Dizziness \\T \ Nausea  . Metoclopramide Hcl   . Penicillins   . Sulfonamide Derivatives     REACTION: Reaction not known    Family History  Problem Relation Age of Onset  . Cancer Mother   . Heart attack Sister   . Cancer Sister   . Asthma Sister   . Diabetes Brother   . Heart attack Brother   . Asthma Brother   . Heart disease Brother      Prior to Admission medications   Medication Sig Start Date End Date Taking? Authorizing Provider  albuterol-ipratropium (COMBIVENT) 18-103 MCG/ACT inhaler Inhale 2 puffs into the lungs every 6 (six) hours as needed for wheezing. 03/28/14  Yes Tammi Sou, MD  amLODipine (NORVASC) 10 MG tablet Take 1 tablet (10 mg total) by mouth daily. For hypertension. 05/13/14  Yes Tammi Sou, MD  benazepril (LOTENSIN) 20 MG tablet Take 1 tablet (20 mg total) by mouth daily. For hypertension. 11/03/13  Yes Tammi Sou, MD  carvedilol (COREG) 12.5 MG tablet 1/2 tab po bid Patient taking differently: Take 6.25 mg by mouth 2 (two) times daily with a meal. 1/2 tab po bid 03/03/14  Yes Tammi Sou, MD  clonazePAM (KLONOPIN) 1 MG tablet 1 tab po tid prn anxiety Patient taking differently: Take 1 mg by mouth 3 (three) times daily as needed for anxiety. 1 tab po tid prn anxiety 01/13/14  Yes Tammi Sou, MD  diclofenac sodium (VOLTAREN) 1 % GEL Apply 2 g topically as needed. Patient taking differently: Apply 2 g topically as needed (pain).    Yes Tammi Sou, MD  DULoxetine (CYMBALTA) 60 MG  capsule Take 1 capsule (60 mg total) by mouth daily. For anxiety and depression. 03/03/14  Yes Tammi Sou, MD  estradiol (ESTRACE) 0.1 MG/GM vaginal cream 4.23 applicators full qd 5/36/14  Yes Tammi Sou, MD  fluticasone (CUTIVATE) 0.05 % cream Apply to affected areas of skin twice daily as needed Patient taking differently: Apply 1 application topically 2 (two) times daily as needed (rash). Apply to affected areas of skin twice daily as needed 06/22/13  Yes Tammi Sou, MD  gabapentin (NEURONTIN) 100 MG capsule Take 200 mg by mouth at bedtime.  05/20/14  Yes Historical Provider, MD  glimepiride (AMARYL) 2 MG tablet May take 1 to 1 tabs po qAM for diabetes Patient taking differently:  Take 1-2 mg by mouth daily with breakfast. May take 1 to 1 tabs po qAM for diabetes 02/18/14  Yes Tammi Sou, MD  lidocaine (LIDODERM) 5 % Place 3 patches onto the skin. Remove & Discard patch within 12 hours or as directed by MD   Yes Historical Provider, MD  methocarbamol (ROBAXIN) 500 MG tablet Take 500 mg by mouth 3 (three) times daily.  06/15/14  Yes Historical Provider, MD  metoprolol succinate (TOPROL-XL) 50 MG 24 hr tablet Take 50 mg by mouth daily.  06/06/14  Yes Historical Provider, MD  omeprazole (PRILOSEC) 20 MG capsule Take 1 capsule (20 mg total) by mouth 2 (two) times daily. 12/23/13  Yes Tammi Sou, MD  Oxybutynin Chloride (GELNIQUE) 10 % GEL Place 1 application onto the skin daily. 02/10/14  Yes Tammi Sou, MD  pravastatin (PRAVACHOL) 80 MG tablet Take 1 tablet (80 mg total) by mouth daily. 04/08/14  Yes Tammi Sou, MD  promethazine (PHENERGAN) 12.5 MG tablet 1 tab po qid prn nausea Patient taking differently: Take 12.5 mg by mouth 4 (four) times daily as needed for nausea or vomiting. 1 tab po qid prn nausea 04/05/14  Yes Tammi Sou, MD  albuterol (PROVENTIL) (2.5 MG/3ML) 0.083% nebulizer solution Take 3 mLs (2.5 mg total) by nebulization every 6 (six) hours as needed  for wheezing. 03/24/14   Tammi Sou, MD  Alum & Mag Hydroxide-Simeth (MAGIC MOUTHWASH W/LIDOCAINE) SOLN 1 tsp po swish/gargle and spit four times per day as needed for mouth pain 01/14/13   Tammi Sou, MD  aspirin 81 MG EC tablet Take 1 tablet (81 mg total) by mouth daily. For platelette aggregation. 08/15/12   Nena Polio, PA-C  fexofenadine (ALLEGRA) 180 MG tablet Take 1 tablet (180 mg total) by mouth daily. 02/08/14   Tammi Sou, MD  fish oil-omega-3 fatty acids 1000 MG capsule Take 2 capsules (2 g total) by mouth daily. For lipid control. 08/15/12   Nena Polio, PA-C  Gabapentin Enacarbil (HORIZANT) 600 MG TBCR Take 600 mg by mouth daily.    Historical Provider, MD  glycopyrrolate (ROBINUL) 2 MG tablet Take 1 tablet (2 mg total) by mouth 3 (three) times daily as needed. 11/25/13   Tammi Sou, MD  nitroGLYCERIN (NITROSTAT) 0.4 MG SL tablet Place 1 tablet (0.4 mg total) under the tongue every 5 (five) minutes as needed for chest pain. 12/02/12   Sherren Mocha, MD  oxyCODONE-acetaminophen (PERCOCET/ROXICET) 5-325 MG per tablet 1-2 tabs po bid prn pain Patient not taking: Reported on 07/08/2014 10/20/13   Tammi Sou, MD  pregabalin (LYRICA) 225 MG capsule Take 1 capsule (225 mg total) by mouth 2 (two) times daily. 07/21/13   Tammi Sou, MD  tiZANidine (ZANAFLEX) 4 MG tablet Take 1 tablet (4 mg total) by mouth 3 (three) times daily. 11/25/13   Tammi Sou, MD  triamcinolone (NASACORT AQ) 55 MCG/ACT AERO nasal inhaler Place 2 sprays into the nose daily. 08/20/13   Tammi Sou, MD   Physical Exam: Filed Vitals:   07/08/14 1645 07/08/14 1700 07/08/14 1715 07/08/14 1730  BP:      Pulse:      Temp:      TempSrc:      Resp: 26 19 16 24   SpO2:        Wt Readings from Last 3 Encounters:  06/27/14 75.751 kg (167 lb)  03/24/14 76.658 kg (169 lb)  01/13/14 81.647 kg (180 lb)  General:  Appears anxious and confused. Eyes: PERRL, normal lids, irises &  conjunctiva ENT: grossly normal hearing, lips & tongue Neck: no LAD, masses or thyromegaly Cardiovascular: RRR, no m/r/g. No LE edema. Telemetry: SR, no arrhythmias  Respiratory: CTA bilaterally, no w/r/r. Normal respiratory effort. Abdomen: soft, ntnd Skin: no rash or induration seen on limited exam Musculoskeletal: grossly normal tone BUE/BLE Psychiatric: inappropriate affect with anxiety. Neurologic: grossly non-focal.          Labs on Admission:  Basic Metabolic Panel:  Recent Labs Lab 07/08/14 1404  NA 144  K 2.8*  CL 100  CO2 31  GLUCOSE 105*  BUN 6  CREATININE 0.63  CALCIUM 9.3   Liver Function Tests:  Recent Labs Lab 07/08/14 1404  AST 14  ALT 10  ALKPHOS 76  BILITOT 0.5  PROT 7.2  ALBUMIN 3.2*   No results for input(s): LIPASE, AMYLASE in the last 168 hours. No results for input(s): AMMONIA in the last 168 hours. CBC:  Recent Labs Lab 07/08/14 1404  WBC 15.0*  NEUTROABS 11.0*  HGB 13.6  HCT 39.8  MCV 84.7  PLT 357   Cardiac Enzymes: No results for input(s): CKTOTAL, CKMB, CKMBINDEX, TROPONINI in the last 168 hours.  BNP (last 3 results) No results for input(s): PROBNP in the last 8760 hours. CBG: No results for input(s): GLUCAP in the last 168 hours.  Radiological Exams on Admission: Dg Chest 2 View  07/08/2014   CLINICAL DATA:  Altered mental status.  EXAM: CHEST  2 VIEW  COMPARISON:  04/27/2012  FINDINGS: The heart size and mediastinal contours are within normal limits. Both lungs are clear. Previous cervical fusion. There is a chronic wedge compression fracture at L2.  IMPRESSION: No evidence for acute cardiopulmonary abnormality  Chronic L2 compression fracture.   Electronically Signed   By: Shon Hale M.D.   On: 07/08/2014 14:59    EKG: Independently reviewed. Normal sinus rhythm without any acute ST-T wave changes.  Assessment/Plan   1. Altered mental status, unclear etiology. I suspect this is part of her bipolar disorder as  there does not appear to be a metabolic problem. 2. Hypokalemia, this is significant and needs to be corrected. 3. Hypertension. 4. Chronic pain syndrome.  Plan: 1. Admit,  2. Intravenous fluids with potassium supplementation. 3.psychiatric consultation.  Further recommendations will depend on patient's progress.   Code Status: full code.  DVT Prophylaxis:heparin.  Family Communication: I discussed the plan with the patient at the bedside although I'm not sure how much she understood.   Disposition Plan: home when medically stable.   Time spent: 60 minutes.  Doree Albee [facility] Pager [phone].

## 2014-07-08 NOTE — ED Notes (Signed)
Pt placed on 2l nasal cannula.

## 2014-07-09 ENCOUNTER — Encounter (HOSPITAL_COMMUNITY): Payer: Self-pay | Admitting: *Deleted

## 2014-07-09 DIAGNOSIS — F319 Bipolar disorder, unspecified: Secondary | ICD-10-CM

## 2014-07-09 DIAGNOSIS — M797 Fibromyalgia: Secondary | ICD-10-CM | POA: Diagnosis present

## 2014-07-09 DIAGNOSIS — G2581 Restless legs syndrome: Secondary | ICD-10-CM | POA: Diagnosis present

## 2014-07-09 DIAGNOSIS — R079 Chest pain, unspecified: Secondary | ICD-10-CM | POA: Diagnosis present

## 2014-07-09 LAB — CBC
HCT: 36.3 % (ref 36.0–46.0)
HEMOGLOBIN: 12.4 g/dL (ref 12.0–15.0)
MCH: 29.4 pg (ref 26.0–34.0)
MCHC: 34.2 g/dL (ref 30.0–36.0)
MCV: 86 fL (ref 78.0–100.0)
PLATELETS: 301 10*3/uL (ref 150–400)
RBC: 4.22 MIL/uL (ref 3.87–5.11)
RDW: 14.9 % (ref 11.5–15.5)
WBC: 9.7 10*3/uL (ref 4.0–10.5)

## 2014-07-09 LAB — COMPREHENSIVE METABOLIC PANEL
ALT: 7 U/L (ref 0–35)
ANION GAP: 11 (ref 5–15)
AST: 12 U/L (ref 0–37)
Albumin: 2.8 g/dL — ABNORMAL LOW (ref 3.5–5.2)
Alkaline Phosphatase: 65 U/L (ref 39–117)
BUN: 8 mg/dL (ref 6–23)
CALCIUM: 8.8 mg/dL (ref 8.4–10.5)
CO2: 27 mEq/L (ref 19–32)
CREATININE: 0.52 mg/dL (ref 0.50–1.10)
Chloride: 107 mEq/L (ref 96–112)
GFR calc Af Amer: >90 mL/min (ref >90)
Glucose, Bld: 88 mg/dL (ref 70–99)
Potassium: 3.2 mEq/L — ABNORMAL LOW (ref 3.7–5.3)
SODIUM: 145 meq/L (ref 137–147)
TOTAL PROTEIN: 6.3 g/dL (ref 6.0–8.3)
Total Bilirubin: 0.4 mg/dL (ref 0.3–1.2)

## 2014-07-09 LAB — GLUCOSE, CAPILLARY
GLUCOSE-CAPILLARY: 143 mg/dL — AB (ref 70–99)
GLUCOSE-CAPILLARY: 121 mg/dL — AB (ref 70–99)
Glucose-Capillary: 90 mg/dL (ref 70–99)
Glucose-Capillary: 111 mg/dL — ABNORMAL HIGH (ref 70–99)

## 2014-07-09 LAB — TROPONIN I
Troponin I: <0.3 ng/mL (ref <0.30)
Troponin I: <0.3 ng/mL (ref <0.30)

## 2014-07-09 MED ORDER — FLUTICASONE PROPIONATE 50 MCG/ACT NA SUSP
2.0000 | Freq: Every day | NASAL | Status: DC
  Administered 2014-07-09: 2 via NASAL
  Filled 2014-07-09: qty 16

## 2014-07-09 MED ORDER — POTASSIUM CHLORIDE CRYS ER 20 MEQ PO TBCR
40.0000 meq | EXTENDED_RELEASE_TABLET | Freq: Once | ORAL | Status: AC
  Administered 2014-07-09: 40 meq via ORAL
  Filled 2014-07-09: qty 2

## 2014-07-09 MED ORDER — CLONAZEPAM 0.5 MG PO TABS
0.5000 mg | ORAL_TABLET | Freq: Three times a day (TID) | ORAL | Status: DC | PRN
  Administered 2014-07-10 (×2): 0.5 mg via ORAL
  Filled 2014-07-09 (×2): qty 1

## 2014-07-09 MED ORDER — OXYCODONE-ACETAMINOPHEN 5-325 MG PO TABS
1.0000 | ORAL_TABLET | Freq: Four times a day (QID) | ORAL | Status: DC | PRN
  Administered 2014-07-10 (×3): 1 via ORAL
  Filled 2014-07-09 (×3): qty 1

## 2014-07-09 MED ORDER — FLUOCINONIDE 0.05 % EX CREA
TOPICAL_CREAM | Freq: Two times a day (BID) | CUTANEOUS | Status: DC | PRN
  Filled 2014-07-09: qty 30

## 2014-07-09 NOTE — Progress Notes (Signed)
Unable to obtain info for pharmacy on frequency and location of Volteran Gel use at home , also the amount of Estrace Cream used.  Pt has been in/out of sleep since arrival to floor.

## 2014-07-09 NOTE — Progress Notes (Signed)
[facility] PROGRESS NOTE  [name] QQP:[id number] DOB: [birth year] DOA: 07/08/2014 PCP: Teresa Sou, MD  Assessment/Plan: 1. Altered mental status. Very likely related to polypharmacy. Patient is on multiple psychotropic/sedative medications. These include Lyrica, gabapentin, methocarbamol, Zanaflex, Klonopin, Percocet. Is quite possible that she accidentally mixed up her medications. Her mental status appears to have improved since admission as she is awake and able to carry on a conversation. She does have signs of paranoia and likely has an underlying psychiatric component as well too. Discussed with psychiatry recommendations were to hold her sedative medications and plans are to repeat psychiatry evaluation once her mental status has further improved. 2. Chest pain. She is ruled out for ACS with negative cardiac markers and EKG is not acute. She clinically follow-up with her cardiologist as an outpatient. 3. Hypertension. Appears to be controlled on current regimen. 4. Restless leg syndrome. Continue gabapentin daily at bedtime 5. Chronic pain syndrome. Will continue with Percocet but hold other sedative medications. 6. Hypokalemia. Replace  Code Status: full code Family Communication: d Disposition Plan: pending psychiatry evaluation   Consultants:  Psychiatry  Procedures:    Antibiotics:    HPI/Subjective: Patient is awake, reports pain in her legs which appears to be chronic, said she had chest pain prior to admission  Objective: Filed Vitals:   07/09/14 1358  BP: 102/45  Pulse: 65  Temp: 98.4 F (36.9 C)  Resp: 18    Intake/Output Summary (Last 24 hours) at 07/09/14 1905 Last data filed at 07/09/14 1700  Gross per 24 hour  Intake    600 ml  Output      0 ml  Net    600 ml   Filed Weights   07/08/14 2006  Weight: 71 kg (156 lb 8.4 oz)    Exam:   General:  NAD, awake, paranoid, able to carry on conversation  Cardiovascular: S1, S2  RRR  Respiratory: cta b  Abdomen: soft, nt, nd, bs+  Musculoskeletal: no edema b/l   Data Reviewed: Basic Metabolic Panel:  Recent Labs Lab 07/08/14 1404 07/09/14 0623  NA 144 145  K 2.8* 3.2*  CL 100 107  CO2 31 27  GLUCOSE 105* 88  BUN 6 8  CREATININE 0.63 0.52  CALCIUM 9.3 8.8   Liver Function Tests:  Recent Labs Lab 07/08/14 1404 07/09/14 0623  AST 14 12  ALT 10 7  ALKPHOS 76 65  BILITOT 0.5 0.4  PROT 7.2 6.3  ALBUMIN 3.2* 2.8*   No results for input(s): LIPASE, AMYLASE in the last 168 hours. No results for input(s): AMMONIA in the last 168 hours. CBC:  Recent Labs Lab 07/08/14 1404 07/09/14 0623  WBC 15.0* 9.7  NEUTROABS 11.0*  --   HGB 13.6 12.4  HCT 39.8 36.3  MCV 84.7 86.0  PLT 357 301   Cardiac Enzymes:  Recent Labs Lab 07/08/14 1404 07/08/14 2352 07/09/14 0623  TROPONINI <0.30 <0.30 <0.30   BNP (last 3 results) No results for input(s): PROBNP in the last 8760 hours. CBG:  Recent Labs Lab 07/08/14 2144 07/09/14 0721 07/09/14 1127 07/09/14 1637  GLUCAP 94 90 143* 121*    No results found for this or any previous visit (from the past 240 hour(s)).   Studies: Dg Chest 2 View  07/08/2014   CLINICAL DATA:  Altered mental status.  EXAM: CHEST  2 VIEW  COMPARISON:  04/27/2012  FINDINGS: The heart size and mediastinal contours are within normal limits. Both lungs are clear. Previous  cervical fusion. There is a chronic wedge compression fracture at L2.  IMPRESSION: No evidence for acute cardiopulmonary abnormality  Chronic L2 compression fracture.   Electronically Signed   By: Shon Hale M.D.   On: 07/08/2014 14:59    Scheduled Meds: . amLODipine  10 mg Oral Daily  . aspirin EC  81 mg Oral Daily  . benazepril  20 mg Oral Daily  . carvedilol  6.25 mg Oral BID WC  . DULoxetine  60 mg Oral Daily  . estradiol  3.66 Applicatorful Vaginal Daily  . fluticasone  2 spray Each Nare Daily  . gabapentin  200 mg Oral QHS  . glimepiride  1  mg Oral Q breakfast  . heparin  5,000 Units Subcutaneous 3 times per day  . lidocaine  3 patch Transdermal Q24H  . loratadine  10 mg Oral Daily  . metoprolol succinate  50 mg Oral Daily  . omega-3 acid ethyl esters  2 g Oral Daily  . Oxybutynin Chloride  1 each Transdermal Daily  . pantoprazole  40 mg Oral Daily  . potassium chloride  40 mEq Oral Once  . pravastatin  80 mg Oral q1800  . sodium chloride  3 mL Intravenous Q12H   Continuous Infusions: . 0.9 % NaCl with KCl 40 mEq / L 100 mL/hr (07/09/14 1812)    Active Problems:   Bipolar disorder   Anxiety state   Essential hypertension   Chronic pain syndrome   Altered mental status   Fibromyalgia   Restless leg syndrome   Chest pain    Time spent: 75mins    [name]  [facility] Pager [phone]. If 7PM-7AM, please contact night-coverage at [email or web address], password [facility] 07/09/2014, 7:05 PM  LOS: 1 day

## 2014-07-09 NOTE — Plan of Care (Signed)
Problem: Consults Goal: General Medical Patient Education See Patient Education Module for specific education.  Outcome: Progressing Goal: Skin Care Protocol Initiated - if Braden Score 18 or less If consults are not indicated, leave blank or document N/A  Outcome: Not Applicable Date Met:  45/14/60

## 2014-07-09 NOTE — Consult Note (Signed)
Telepsych Consultation   Reason for Consult: Altered Mental Status Referring Physician:  [name] is an 68 y.o. female.  Assessment: AXIS I:  Altered mental status, R/O  accidental OD AXIS II:  Deferred AXIS III:   Past Medical History  Diagnosis Date  . Obesity   . Seizure   . Skin cancer   . Asthma   . GERD (gastroesophageal reflux disease)   . Hypertension   . Tobacco user   . CAD (coronary artery disease)     Moderate disease of the left circumflex managed medically  . Fibromyalgia   . Chronic pain   . Diverticul disease small and large intestine, no perforati or abscess   . Gastroparesis     abnl gastric emptying scan 2010 (Dr. Deatra Ina)  . Anxiety   . Hyperlipidemia   . Urge and stress incontinence   . Chronic cystitis   . Rectal prolapse   . Neurotic excoriations 01/28/2013  . Leukocytosis     chronic, mild, s/p eval 03/2013 by hematologist --reassured; likely secondary to ongoing smoking  . Type II or unspecified type diabetes mellitus with neurological manifestations, not stated as uncontrolled(250.60) 08/17/2008    Hx of diabetic foot ulcer.  . Bipolar disorder     Psych hosp admission 08/2012--manic episode  . COPD (chronic obstructive pulmonary disease)   . Right knee pain Summer 2014    MRI 04/2013: large popliteal fossa ganglion cyst, mod medial compartment and patellofemoral osteoarth--referred to [facility] at that time (Dr. Berenice Primas)  . Muscle spasm   . DDD (degenerative disc disease), cervical   . DDD (degenerative disc disease), lumbar     MRI 12/2013: right foraminal disc protrusion L4-5, mild facet hypertrophy L5-S1--gets ESI's by Dr. Vira Blanco   AXIS IV:  other psychosocial or environmental problems and problems related to social environment AXIS V:  41-50 serious symptoms  Plan:  No evidence of imminent risk to self or others at present.    Subjective:   [name] is a 68 y.o. female patient admitted with Altered mental  status.  HPI:  Caucasian female, 68 years old was seen via tele Psychiatry this afternoon.  Patient was brought in to the ER yesterday after she called them complaining of chest and leg pain.  Patient state she live alone in an apartment and manages her medications by herself.  Patient on getting to the ER was noted to be altered.  Patient was sleeping off and on and at times did not make sense in what she was saying.  Today, attempt to assess her via tele psych failed as it was difficult to wake patient up.  Assessment was postponed.  This afternoon during lunch, patient was awake, alert and oriented x3.  She stated that she has a hx of Bipolar disorder and is not taking any medications for that.  Patient stated that she is taking a lot of medications for medical conditions.  She stated that she was surprised her medications were all mixed up two days ago and she believe people living above her may have something to do with it.  She was then asked she had accidentally taken more medications than prescribed patient stated "may be"  She was more awake this afternoon but still rambles some words.  She could not give her daughters contact number for collateral information but stated that he daughter is moving and she does not have a phone number for her.  A friend of hers stated patient  was ejected recently from her house for not paying her rent or mortgage and that she has not seen her for some time.  Patient denied feeling depressed or sad. At this time, Psychiatric assessment is inconclusive.  We will repeat tele psychiatry by Monday when patient could give more information.  Our view is that may be patient accidentally OD on her medications.  She is supposed to be taking Clonazepam 1 mg three times a day as needed plus other medications that can alter her mentation if taken in large quantity.  We will assess patient again on Monday.  She denied SI/HI/AVH.  HPI Elements:   Location:  Altered mental status, R/O  accidental OD. Quality:  SEVERE. Severity:  SEVERE. Timing:  ACUTE. Context:  Being evaluated for altered mental status..  Past Psychiatric History: Past Medical History  Diagnosis Date  . Obesity   . Seizure   . Skin cancer   . Asthma   . GERD (gastroesophageal reflux disease)   . Hypertension   . Tobacco user   . CAD (coronary artery disease)     Moderate disease of the left circumflex managed medically  . Fibromyalgia   . Chronic pain   . Diverticul disease small and large intestine, no perforati or abscess   . Gastroparesis     abnl gastric emptying scan 2010 (Dr. Deatra Ina)  . Anxiety   . Hyperlipidemia   . Urge and stress incontinence   . Chronic cystitis   . Rectal prolapse   . Neurotic excoriations 01/28/2013  . Leukocytosis     chronic, mild, s/p eval 03/2013 by hematologist --reassured; likely secondary to ongoing smoking  . Type II or unspecified type diabetes mellitus with neurological manifestations, not stated as uncontrolled(250.60) 08/17/2008    Hx of diabetic foot ulcer.  . Bipolar disorder     Psych hosp admission 08/2012--manic episode  . COPD (chronic obstructive pulmonary disease)   . Right knee pain Summer 2014    MRI 04/2013: large popliteal fossa ganglion cyst, mod medial compartment and patellofemoral osteoarth--referred to [facility] at that time (Dr. Berenice Primas)  . Muscle spasm   . DDD (degenerative disc disease), cervical   . DDD (degenerative disc disease), lumbar     MRI 12/2013: right foraminal disc protrusion L4-5, mild facet hypertrophy L5-S1--gets ESI's by Dr. Vira Blanco    reports that she has been smoking Cigarettes.  She has been smoking about 1.50 packs per day. She has never used smokeless tobacco. She reports that she does not drink alcohol or use illicit drugs. Family History  Problem Relation Age of Onset  . Cancer Mother   . Heart attack Sister   . Cancer Sister   . Asthma Sister   . Diabetes Brother   . Heart attack Brother   .  Asthma Brother   . Heart disease Brother      Living Arrangements: Alone   Allergies:   Allergies  Allergen Reactions  . Dust Mite Extract   . Januvia [Sitagliptin Phosphate] Nausea Only  . Latex   . Metformin     REACTION: Dizziness \T\ Nausea  . Metoclopramide Hcl   . Penicillins   . Sulfonamide Derivatives     REACTION: Reaction not known    ACT Assessment Complete:  No:   Past Psychiatric History: Diagnosis:  Bipolar  Hospitalizations:  Denies  Outpatient Care:  denies  Substance Abuse Care:  Denies  Self-Mutilation:  denies  Suicidal Attempts:  Denies  Homicidal Behaviors:  Denies  Violent Behaviors:  Denies   Place of Residence:  [place] Marital Status:  single Employed/Unemployed: none Education: Unknown Family Supports:  none Objective: Blood pressure 102/45, pulse 65, temperature 98.4 F (36.9 C), temperature source Oral, resp. rate 18, height 5' 4"  (1.626 m), weight 71 kg (156 lb 8.4 oz), SpO2 94 %.Body mass index is 26.85 kg/(m^2). Results for orders placed or performed during the hospital encounter of 07/08/14 (from the past 72 hour(s))  CBC with Differential     Status: Abnormal   Collection Time: 07/08/14  2:04 PM  Result Value Ref Range   WBC 15.0 (H) 4.0 - 10.5 K/uL   RBC 4.70 3.87 - 5.11 MIL/uL   Hemoglobin 13.6 12.0 - 15.0 g/dL   HCT 39.8 36.0 - 46.0 %   MCV 84.7 78.0 - 100.0 fL   MCH 28.9 26.0 - 34.0 pg   MCHC 34.2 30.0 - 36.0 g/dL   RDW 14.7 11.5 - 15.5 %   Platelets 357 150 - 400 K/uL   Neutrophils Relative % 73 43 - 77 %   Neutro Abs 11.0 (H) 1.7 - 7.7 K/uL   Lymphocytes Relative 21 12 - 46 %   Lymphs Abs 3.1 0.7 - 4.0 K/uL   Monocytes Relative 5 3 - 12 %   Monocytes Absolute 0.7 0.1 - 1.0 K/uL   Eosinophils Relative 1 0 - 5 %   Eosinophils Absolute 0.2 0.0 - 0.7 K/uL   Basophils Relative 0 0 - 1 %   Basophils Absolute 0.1 0.0 - 0.1 K/uL  Comprehensive metabolic panel     Status: Abnormal   Collection Time: 07/08/14  2:04 PM   Result Value Ref Range   Sodium 144 137 - 147 mEq/L   Potassium 2.8 (LL) 3.7 - 5.3 mEq/L    Comment: CRITICAL RESULT CALLED TO, READ BACK BY AND VERIFIED WITH: PRUITT,G, AT 1454 BY GODFREY,O ON 07/08/14    Chloride 100 96 - 112 mEq/L   CO2 31 19 - 32 mEq/L   Glucose, Bld 105 (H) 70 - 99 mg/dL   BUN 6 6 - 23 mg/dL   Creatinine, Ser 0.63 0.50 - 1.10 mg/dL   Calcium 9.3 8.4 - 10.5 mg/dL   Total Protein 7.2 6.0 - 8.3 g/dL   Albumin 3.2 (L) 3.5 - 5.2 g/dL   AST 14 0 - 37 U/L   ALT 10 0 - 35 U/L   Alkaline Phosphatase 76 39 - 117 U/L   Total Bilirubin 0.5 0.3 - 1.2 mg/dL   GFR calc non Af Amer >90 >90 mL/min   GFR calc Af Amer >90 >90 mL/min    Comment: (NOTE) The eGFR has been calculated using the CKD EPI equation. This calculation has not been validated in all clinical situations. eGFR's persistently <90 mL/min signify possible Chronic Kidney Disease.    Anion gap 13 5 - 15  Ethanol     Status: None   Collection Time: 07/08/14  2:04 PM  Result Value Ref Range   Alcohol, Ethyl (B) <11 0 - 11 mg/dL    Comment:        LOWEST DETECTABLE LIMIT FOR SERUM ALCOHOL IS 11 mg/dL FOR MEDICAL PURPOSES ONLY   Troponin I     Status: None   Collection Time: 07/08/14  2:04 PM  Result Value Ref Range   Troponin I <0.30 <0.30 ng/mL    Comment:        Due to the release kinetics of cTnI, a negative result within  the first hours of the onset of symptoms does not rule out myocardial infarction with certainty. If myocardial infarction is still suspected, repeat the test at appropriate intervals.   Urinalysis, Routine w reflex microscopic     Status: Abnormal   Collection Time: 07/08/14  3:59 PM  Result Value Ref Range   Color, Urine YELLOW YELLOW   APPearance CLEAR CLEAR   Specific Gravity, Urine 1.015 1.005 - 1.030   pH 6.5 5.0 - 8.0   Glucose, UA NEGATIVE NEGATIVE mg/dL   Hgb urine dipstick NEGATIVE NEGATIVE   Bilirubin Urine NEGATIVE NEGATIVE   Ketones, ur 15 (A) NEGATIVE mg/dL    Protein, ur NEGATIVE NEGATIVE mg/dL   Urobilinogen, UA 0.2 0.0 - 1.0 mg/dL   Nitrite NEGATIVE NEGATIVE   Leukocytes, UA NEGATIVE NEGATIVE    Comment: MICROSCOPIC NOT DONE ON URINES WITH NEGATIVE PROTEIN, BLOOD, LEUKOCYTES, NITRITE, OR GLUCOSE <1000 mg/dL.  Urine rapid drug screen (hosp performed)     Status: None   Collection Time: 07/08/14  3:59 PM  Result Value Ref Range   Opiates NONE DETECTED NONE DETECTED   Cocaine NONE DETECTED NONE DETECTED   Benzodiazepines NONE DETECTED NONE DETECTED   Amphetamines NONE DETECTED NONE DETECTED   Tetrahydrocannabinol NONE DETECTED NONE DETECTED   Barbiturates NONE DETECTED NONE DETECTED    Comment:        DRUG SCREEN FOR MEDICAL PURPOSES ONLY.  IF CONFIRMATION IS NEEDED FOR ANY PURPOSE, NOTIFY LAB WITHIN 5 DAYS.        LOWEST DETECTABLE LIMITS FOR URINE DRUG SCREEN Drug Class       Cutoff (ng/mL) Amphetamine      1000 Barbiturate      200 Benzodiazepine   283 Tricyclics       151 Opiates          300 Cocaine          300 THC              50   Glucose, capillary     Status: None   Collection Time: 07/08/14  9:44 PM  Result Value Ref Range   Glucose-Capillary 94 70 - 99 mg/dL   Comment 1 Documented in Chart    Comment 2 Notify RN   Troponin I     Status: None   Collection Time: 07/08/14 11:52 PM  Result Value Ref Range   Troponin I <0.30 <0.30 ng/mL    Comment:        Due to the release kinetics of cTnI, a negative result within the first hours of the onset of symptoms does not rule out myocardial infarction with certainty. If myocardial infarction is still suspected, repeat the test at appropriate intervals.   Troponin I     Status: None   Collection Time: 07/09/14  6:23 AM  Result Value Ref Range   Troponin I <0.30 <0.30 ng/mL    Comment:        Due to the release kinetics of cTnI, a negative result within the first hours of the onset of symptoms does not rule out myocardial infarction with certainty. If myocardial  infarction is still suspected, repeat the test at appropriate intervals.   Comprehensive metabolic panel     Status: Abnormal   Collection Time: 07/09/14  6:23 AM  Result Value Ref Range   Sodium 145 137 - 147 mEq/L   Potassium 3.2 (L) 3.7 - 5.3 mEq/L   Chloride 107 96 - 112 mEq/L   CO2 27 19 -  32 mEq/L   Glucose, Bld 88 70 - 99 mg/dL   BUN 8 6 - 23 mg/dL   Creatinine, Ser 0.52 0.50 - 1.10 mg/dL   Calcium 8.8 8.4 - 10.5 mg/dL   Total Protein 6.3 6.0 - 8.3 g/dL   Albumin 2.8 (L) 3.5 - 5.2 g/dL   AST 12 0 - 37 U/L   ALT 7 0 - 35 U/L   Alkaline Phosphatase 65 39 - 117 U/L   Total Bilirubin 0.4 0.3 - 1.2 mg/dL   GFR calc non Af Amer >90 >90 mL/min   GFR calc Af Amer >90 >90 mL/min    Comment: (NOTE) The eGFR has been calculated using the CKD EPI equation. This calculation has not been validated in all clinical situations. eGFR's persistently <90 mL/min signify possible Chronic Kidney Disease.    Anion gap 11 5 - 15  CBC     Status: None   Collection Time: 07/09/14  6:23 AM  Result Value Ref Range   WBC 9.7 4.0 - 10.5 K/uL   RBC 4.22 3.87 - 5.11 MIL/uL   Hemoglobin 12.4 12.0 - 15.0 g/dL   HCT 36.3 36.0 - 46.0 %   MCV 86.0 78.0 - 100.0 fL   MCH 29.4 26.0 - 34.0 pg   MCHC 34.2 30.0 - 36.0 g/dL   RDW 14.9 11.5 - 15.5 %   Platelets 301 150 - 400 K/uL  Glucose, capillary     Status: None   Collection Time: 07/09/14  7:21 AM  Result Value Ref Range   Glucose-Capillary 90 70 - 99 mg/dL   Comment 1 Documented in Chart    Comment 2 Notify RN   Glucose, capillary     Status: Abnormal   Collection Time: 07/09/14 11:27 AM  Result Value Ref Range   Glucose-Capillary 143 (H) 70 - 99 mg/dL   Comment 1 Notify RN    Comment 2 Documented in Chart    Labs are reviewed and are pertinent for Unremarkable..  Current Facility-Administered Medications  Medication Dose Route Frequency Provider Last Rate Last Dose  . 0.9 % NaCl with KCl 40 mEq / L  infusion   Intravenous Continuous Doree Albee, MD 100 mL/hr at 07/08/14 2047 100 mL/hr at 07/08/14 2047  . albuterol (PROVENTIL) (2.5 MG/3ML) 0.083% nebulizer solution 2.5 mg  2.5 mg Nebulization Q6H PRN Nimish C Gosrani, MD      . amLODipine (NORVASC) tablet 10 mg  10 mg Oral Daily Nimish Luther Parody, MD   10 mg at 07/09/14 0923  . aspirin EC tablet 81 mg  81 mg Oral Daily Nimish C Anastasio Champion, MD   81 mg at 07/09/14 0920  . benazepril (LOTENSIN) tablet 20 mg  20 mg Oral Daily Doree Albee, MD   20 mg at 07/09/14 0923  . carvedilol (COREG) tablet 6.25 mg  6.25 mg Oral BID WC Nimish C Anastasio Champion, MD   6.25 mg at 07/09/14 0921  . clonazePAM (KLONOPIN) tablet 1 mg  1 mg Oral TID PRN Doree Albee, MD   1 mg at 07/09/14 0649  . diclofenac sodium (VOLTAREN) 1 % transdermal gel 2 g  2 g Topical PRN Doree Albee, MD   2 g at 07/09/14 0924  . DULoxetine (CYMBALTA) DR capsule 60 mg  60 mg Oral Daily Doree Albee, MD   60 mg at 07/09/14 0920  . estradiol (ESTRACE) vaginal cream 6.38 Applicatorful  9.37 Applicatorful Vaginal Daily Nimish Luther Parody, MD      .  fluocinonide cream (LIDEX) 0.05 %   Topical BID PRN Kathie Dike, MD      . fluticasone (FLONASE) 50 MCG/ACT nasal spray 2 spray  2 spray Each Nare Daily Kathie Dike, MD   2 spray at 07/09/14 1348  . gabapentin (NEURONTIN) capsule 200 mg  200 mg Oral QHS Doree Albee, MD   200 mg at 07/08/14 2116  . glimepiride (AMARYL) tablet 1 mg  1 mg Oral Q breakfast Nimish Luther Parody, MD   1 mg at 07/09/14 0921  . glycopyrrolate (ROBINUL) tablet 2 mg  2 mg Oral TID PRN Nimish Luther Parody, MD      . heparin injection 5,000 Units  5,000 Units Subcutaneous 3 times per day Doree Albee, MD   5,000 Units at 07/09/14 1348  . ipratropium-albuterol (DUONEB) 0.5-2.5 (3) MG/3ML nebulizer solution 3 mL  3 mL Nebulization Q6H PRN Nimish C Gosrani, MD      . lidocaine (LIDODERM) 5 % 3 patch  3 patch Transdermal Q24H Nimish C Gosrani, MD   3 patch at 07/08/14 2200  . loratadine (CLARITIN) tablet 10  mg  10 mg Oral Daily Doree Albee, MD   10 mg at 07/09/14 0921  . methocarbamol (ROBAXIN) tablet 500 mg  500 mg Oral TID Doree Albee, MD   500 mg at 07/09/14 0921  . metoprolol succinate (TOPROL-XL) 24 hr tablet 50 mg  50 mg Oral Daily Nimish C Gosrani, MD   50 mg at 07/09/14 0919  . nitroGLYCERIN (NITROSTAT) SL tablet 0.4 mg  0.4 mg Sublingual Q5 min PRN Nimish C Gosrani, MD      . omega-3 acid ethyl esters (LOVAZA) capsule 2 g  2 g Oral Daily Nimish Luther Parody, MD   2 g at 07/09/14 0919  . ondansetron (ZOFRAN) tablet 4 mg  4 mg Oral Q6H PRN Nimish Luther Parody, MD       Or  . ondansetron (ZOFRAN) injection 4 mg  4 mg Intravenous Q6H PRN Nimish C Gosrani, MD      . Oxybutynin Chloride 10 % GEL 1 application  1 each Transdermal Daily Nimish C Gosrani, MD      . pantoprazole (PROTONIX) EC tablet 40 mg  40 mg Oral Daily Nimish C Anastasio Champion, MD   40 mg at 07/09/14 0923  . pravastatin (PRAVACHOL) tablet 80 mg  80 mg Oral q1800 Doree Albee, MD   80 mg at 07/08/14 2117  . pregabalin (LYRICA) capsule 225 mg  225 mg Oral BID Doree Albee, MD   225 mg at 07/09/14 0920  . sodium chloride 0.9 % injection 3 mL  3 mL Intravenous Q12H Nimish Luther Parody, MD   3 mL at 07/09/14 0924  . tiZANidine (ZANAFLEX) tablet 4 mg  4 mg Oral TID Doree Albee, MD   4 mg at 07/09/14 7893    Psychiatric Specialty Exam:     Blood pressure 102/45, pulse 65, temperature 98.4 F (36.9 C), temperature source Oral, resp. rate 18, height 5' 4"  (1.626 m), weight 71 kg (156 lb 8.4 oz), SpO2 94 %.Body mass index is 26.85 kg/(m^2).  General Appearance: Casual  Eye Contact::  Fair  Speech:  Garbled and Slow  Volume:  Normal  Mood:  Euthymic  Affect:  Congruent  Thought Process:  Goal Directed and Intact  Orientation:  Full (Time, Place, and Person)  Thought Content:  WDL  Suicidal Thoughts:  No  Homicidal Thoughts:  No  Memory:  Immediate;  Good Recent;   Fair Remote;   Fair  Judgement:  Fair  Insight:  Fair   Psychomotor Activity:  Normal  Concentration:  Fair  Recall:  NA  Akathisia:  NA  Handed:  Right  AIMS (if indicated):     Assets:  Desire for Improvement Housing  Sleep:      Treatment Plan Summary:  Discussed  With Dr Sabra Heck, Psychiatrist who agreed that patient  Need to be monitored medically for now and tele psych will be repeated on monday Will repeat telepsych evaluation on Monday  Disposition:  Spoke with Dr Roderic Palau who agrees that patient need to be monitored medically and sedating medications need to be on hold for now.    Charmaine Downs, C   PMHNP-BC 07/09/2014 3:07 PM  Addendum Note:  Re-evaluated patient  today.  Patient  Is alert, oriented x 3 and participated fully in the interview.  Patient stated that she believe her altered mental status must have been as a result of low blood sugar plus medications.  Patient stated because of a recent move she has not monitored her blood sugar as she should and that may be she must have taken more than needed medication.   She however stated that she is very careful with her medications.  She stated that recently she has been stressed out and not paying attention to her health.  She is ready to go home and will follow up with her [place] provider at the shelter where she is residing.  She states the shelter is safe and that she will start monitoring her sugar level again.  She denies SI/HI/AVH.  Patient states she will come back to the hospital if she starts feeling sick again. Consulted and discussed with DR Blackduck.  He concur that patient can go home and follow up with her [facility] provider.  Psychiatry suggest a home health care taker to assist patient manage her blood sugar and her medications. Patient is cleared to by Psychiatry to go home and follow up with her outpatient [facility] provider.   I agree with assessment and plan Geralyn Flash A. Sabra Heck, M.D.

## 2014-07-10 DIAGNOSIS — G2581 Restless legs syndrome: Secondary | ICD-10-CM

## 2014-07-10 DIAGNOSIS — E876 Hypokalemia: Secondary | ICD-10-CM | POA: Insufficient documentation

## 2014-07-10 LAB — GLUCOSE, CAPILLARY
Glucose-Capillary: 105 mg/dL — ABNORMAL HIGH (ref 70–99)
Glucose-Capillary: 83 mg/dL (ref 70–99)
Glucose-Capillary: 106 mg/dL — ABNORMAL HIGH (ref 70–99)

## 2014-07-10 LAB — BASIC METABOLIC PANEL
Anion gap: 9 (ref 5–15)
BUN: 7 mg/dL (ref 6–23)
CO2: 23 mEq/L (ref 19–32)
Calcium: 8.3 mg/dL — ABNORMAL LOW (ref 8.4–10.5)
Chloride: 113 mEq/L — ABNORMAL HIGH (ref 96–112)
Creatinine, Ser: 0.52 mg/dL (ref 0.50–1.10)
GFR calc Af Amer: >90 mL/min (ref >90)
GFR calc non Af Amer: >90 mL/min (ref >90)
Glucose, Bld: 133 mg/dL — ABNORMAL HIGH (ref 70–99)
Potassium: 4 mEq/L (ref 3.7–5.3)
Sodium: 145 mEq/L (ref 137–147)

## 2014-07-10 LAB — CLOSTRIDIUM DIFFICILE BY PCR: Toxigenic C. Difficile by PCR: NEGATIVE

## 2014-07-10 MED ORDER — PREGABALIN 225 MG PO CAPS: 225.0000 mg | ORAL_CAPSULE | Freq: Two times a day (BID) | ORAL | Status: DC | PRN

## 2014-07-10 NOTE — Discharge Summary (Signed)
Physician Discharge Summary  [name] QPY:[id number] DOB: [birth year] DOA: 07/08/2014  PCP: Tammi Sou, MD  Admit date: 07/08/2014 Discharge date: 07/10/2014  Time spent: 40 minutes  Recommendations for Outpatient Follow-up:  1. Patient has been advised to follow-up with her outpatient mental health provider in the next week 2. She has been set up with home health services to assist with her medication management 3. Follow-up with her primary care physician a 1-2 weeks  Discharge Diagnoses:  Active Problems:   Bipolar disorder   Anxiety state   Essential hypertension   Chronic pain syndrome   Altered mental status   Fibromyalgia   Restless leg syndrome   Chest pain   Hypokalemia   Discharge Condition: improved  Diet recommendation: low-salt  Filed Weights   07/08/14 2006  Weight: 71 kg (156 lb 8.4 oz)    History of present illness:  This patient was admitted to the hospital with weakness and altered mental status. She complained of pain in her chest and her legs. She was somewhat lethargic and history was limited on admission. She was admitted for further treatment.  Hospital Course:  Regarding her lethargy, this was felt to be related to polypharmacy. The patient is on multiple agents which could cause sedation such as Zanaflex, Robaxin, Lyrica, Klonopin, Percocet, gabapentin. It is likely that she may have gotten confused and take her medications inappropriately. She reports that the only new medication is Robaxin. This has been discontinued. During her Hospital stay, the majority of her medications were held and the patient improved significantly. She did seem mildly paranoid and therefore psychiatry was asked to see the patient. After several evaluations, it was felt by the psychiatry service and the patient will be safe to discharge home. They recommended close follow-up with her outpatient mental health provider. She does not express any suicidal or homicidal  ideations. She has been set up with home health nursing to assist with her medication management since the cause of her altered mental status likely accidental polypharmacy.  Regarding her chest pain, she ruled out for ACS with negative cardiac markers and a nonacute EKG. She did not have any further episodes since her hospital stay.  She was found to be mildly dehydrated and hypokalemic. This was improved with IV fluids and electrolyte replenishment.  At this time, the patient is awake, alert and conversing appropriately. She will likely need further adjustment in her medications. Advised to follow-up with her outpatient provider suggests a primary care physician and mental health provider in the next week.  Procedures:    Consultations:  psychiatry  Discharge Exam: Filed Vitals:   07/10/14 1454  BP: 134/54  Pulse: 78  Temp: 98.9 F (37.2 C)  Resp: 20    General: no acute distress Cardiovascular: S1, S2, regular rate and rhythm Respiratory: clear to auscultation bilaterally  Discharge Instructions You were cared for by a hospitalist during your hospital stay. If you have any questions about your discharge medications or the care you received while you were in the hospital after you are discharged, you can call the unit and asked to speak with the hospitalist on call if the hospitalist that took care of you is not available. Once you are discharged, your primary care physician will handle any further medical issues. Please note that NO REFILLS for any discharge medications will be authorized once you are discharged, as it is imperative that you return to your primary care physician (or establish a relationship with a primary care  physician if you do not have one) for your aftercare needs so that they can reassess your need for medications and monitor your lab values.  Discharge Instructions    Call MD for:  extreme fatigue    Complete by:  As directed      Call MD for:  persistant  dizziness or light-headedness    Complete by:  As directed      Diet - low sodium heart healthy    Complete by:  As directed      Face-to-face encounter (required for Medicare/Medicaid patients)    Complete by:  As directed   I MEMON,JEHANZEB certify that this patient is under my care and that I, or a nurse practitioner or physician's assistant working with me, had a face-to-face encounter that meets the physician face-to-face encounter requirements with this patient on 07/10/2014. The encounter with the patient was in whole, or in part for the following medical condition(s) which is the primary reason for home health care (List medical condition): patient with medication non compliance, needs home health RN  The encounter with the patient was in whole, or in part, for the following medical condition, which is the primary reason for home health care:  polypharmacy  I certify that, based on my findings, the following services are medically necessary home health services:  Nursing  Reason for Medically Necessary Home Health Services:  Skilled Nursing- Changes in Medication/Medication Management  My clinical findings support the need for the above services:  Cognitive impairments, dementia, or mental confusion  that make it unsafe to leave home  Further, I certify that my clinical findings support that this patient is homebound due to:  Mental confusion     Home Health    Complete by:  As directed   To provide the following care/treatments:  RN     Increase activity slowly    Complete by:  As directed           Discharge Medication List as of 07/10/2014  6:06 PM    CONTINUE these medications which have CHANGED   Details  pregabalin (LYRICA) 225 MG capsule Take 1 capsule (225 mg total) by mouth 2 (two) times daily as needed., Starting 07/10/2014, Until Discontinued, Print      CONTINUE these medications which have NOT CHANGED   Details  albuterol-ipratropium (COMBIVENT) 18-103 MCG/ACT inhaler  Inhale 2 puffs into the lungs every 6 (six) hours as needed for wheezing., Starting 03/28/2014, Until Discontinued, Normal    amLODipine (NORVASC) 10 MG tablet Take 1 tablet (10 mg total) by mouth daily. For hypertension., Starting 05/13/2014, Until Discontinued, Normal    benazepril (LOTENSIN) 20 MG tablet Take 1 tablet (20 mg total) by mouth daily. For hypertension., Starting 11/03/2013, Until Discontinued, Normal    carvedilol (COREG) 12.5 MG tablet 1/2 tab po bid, Normal    clonazePAM (KLONOPIN) 1 MG tablet 1 tab po tid prn anxiety, Print    diclofenac sodium (VOLTAREN) 1 % GEL Apply 2 g topically as needed., Until Discontinued, Normal    DULoxetine (CYMBALTA) 60 MG capsule Take 1 capsule (60 mg total) by mouth daily. For anxiety and depression., Starting 03/03/2014, Until Discontinued, Normal    estradiol (ESTRACE) 0.1 MG/GM vaginal cream 1.15 applicators full qd, Normal    fluticasone (CUTIVATE) 0.05 % cream Apply to affected areas of skin twice daily as needed, Normal    gabapentin (NEURONTIN) 100 MG capsule Take 200 mg by mouth at bedtime. , Starting 05/20/2014, Until Discontinued, Historical Med  glimepiride (AMARYL) 2 MG tablet May take 1 to 1 tabs po qAM for diabetes, Normal    lidocaine (LIDODERM) 5 % Place 3 patches onto the skin. Remove & Discard patch within 12 hours or as directed by MD, Until Discontinued, Historical Med    omeprazole (PRILOSEC) 20 MG capsule Take 1 capsule (20 mg total) by mouth 2 (two) times daily., Starting 12/23/2013, Until Discontinued, Normal    Oxybutynin Chloride (GELNIQUE) 10 % GEL Place 1 application onto the skin daily., Starting 02/10/2014, Until Discontinued, Normal    pravastatin (PRAVACHOL) 80 MG tablet Take 1 tablet (80 mg total) by mouth daily., Starting 04/08/2014, Until Discontinued, Print    promethazine (PHENERGAN) 12.5 MG tablet 1 tab po qid prn nausea, Normal    albuterol (PROVENTIL) (2.5 MG/3ML) 0.083% nebulizer solution Take 3 mLs (2.5  mg total) by nebulization every 6 (six) hours as needed for wheezing., Starting 03/24/2014, Until Discontinued, Normal    Alum & Mag Hydroxide-Simeth (MAGIC MOUTHWASH W/LIDOCAINE) SOLN 1 tsp po swish/gargle and spit four times per day as needed for mouth pain, Normal    aspirin 81 MG EC tablet Take 1 tablet (81 mg total) by mouth daily. For platelette aggregation., Starting 08/15/2012, Until Discontinued, No Print    fexofenadine (ALLEGRA) 180 MG tablet Take 1 tablet (180 mg total) by mouth daily., Starting 02/08/2014, Until Discontinued, Normal    fish oil-omega-3 fatty acids 1000 MG capsule Take 2 capsules (2 g total) by mouth daily. For lipid control., Starting 08/15/2012, Until Discontinued, OTC    glycopyrrolate (ROBINUL) 2 MG tablet Take 1 tablet (2 mg total) by mouth 3 (three) times daily as needed., Starting 11/25/2013, Until Discontinued, Normal    nitroGLYCERIN (NITROSTAT) 0.4 MG SL tablet Place 1 tablet (0.4 mg total) under the tongue every 5 (five) minutes as needed for chest pain., Starting 12/02/2012, Until Discontinued, Normal    oxyCODONE-acetaminophen (PERCOCET/ROXICET) 5-325 MG per tablet 1-2 tabs po bid prn pain, Print    tiZANidine (ZANAFLEX) 4 MG tablet Take 1 tablet (4 mg total) by mouth 3 (three) times daily., Starting 11/25/2013, Until Discontinued, Normal    triamcinolone (NASACORT AQ) 55 MCG/ACT AERO nasal inhaler Place 2 sprays into the nose daily., Starting 08/20/2013, Until Discontinued, Normal      STOP taking these medications     methocarbamol (ROBAXIN) 500 MG tablet      metoprolol succinate (TOPROL-XL) 50 MG 24 hr tablet      Gabapentin Enacarbil (HORIZANT) 600 MG TBCR        Allergies  Allergen Reactions  . Dust Mite Extract   . Januvia [Sitagliptin Phosphate] Nausea Only  . Latex   . Metformin     REACTION: Dizziness \\T \ Nausea  . Metoclopramide Hcl   . Penicillins   . Sulfonamide Derivatives     REACTION: Reaction not known   Follow-up  Information    Follow up with follow up with your mental health provider in 1 week.      Follow up with MCGOWEN,PHILIP H, MD. Schedule an appointment as soon as possible for a visit in 2 weeks.   Specialty:  Family Medicine   Contact information:   [address] [place] [address] [phone]        The results of significant diagnostics from this hospitalization (including imaging, microbiology, ancillary and laboratory) are listed below for reference.    Significant Diagnostic Studies: Dg Chest 2 View  07/08/2014   CLINICAL DATA:  Altered mental status.  EXAM: CHEST  2 VIEW  COMPARISON:  04/27/2012  FINDINGS: The heart size and mediastinal contours are within normal limits. Both lungs are clear. Previous cervical fusion. There is a chronic wedge compression fracture at L2.  IMPRESSION: No evidence for acute cardiopulmonary abnormality  Chronic L2 compression fracture.   Electronically Signed   By: Shon Hale M.D.   On: 07/08/2014 14:59    Microbiology: Recent Results (from the past 240 hour(s))  Clostridium Difficile by PCR     Status: None   Collection Time: 07/10/14 10:45 AM  Result Value Ref Range Status   C difficile by pcr NEGATIVE NEGATIVE Final     Labs: Basic Metabolic Panel:  Recent Labs Lab 07/08/14 1404 07/09/14 0623 07/10/14 0552  NA 144 145 145  K 2.8* 3.2* 4.0  CL 100 107 113*  CO2 31 27 23   GLUCOSE 105* 88 133*  BUN 6 8 7   CREATININE 0.63 0.52 0.52  CALCIUM 9.3 8.8 8.3*   Liver Function Tests:  Recent Labs Lab 07/08/14 1404 07/09/14 0623  AST 14 12  ALT 10 7  ALKPHOS 76 65  BILITOT 0.5 0.4  PROT 7.2 6.3  ALBUMIN 3.2* 2.8*   No results for input(s): LIPASE, AMYLASE in the last 168 hours. No results for input(s): AMMONIA in the last 168 hours. CBC:  Recent Labs Lab 07/08/14 1404 07/09/14 0623  WBC 15.0* 9.7  NEUTROABS 11.0*  --   HGB 13.6 12.4  HCT 39.8 36.3  MCV 84.7 86.0  PLT 357 301   Cardiac Enzymes:  Recent  Labs Lab 07/08/14 1404 07/08/14 2352 07/09/14 0623  TROPONINI <0.30 <0.30 <0.30   BNP: BNP (last 3 results) No results for input(s): PROBNP in the last 8760 hours. CBG:  Recent Labs Lab 07/09/14 1637 07/09/14 2104 07/10/14 0748 07/10/14 1109 07/10/14 1631  GLUCAP 121* 111* 105* 83 106*       Signed:  MEMON,JEHANZEB  [facility] 07/10/2014, 7:32 PM

## 2014-07-10 NOTE — Progress Notes (Signed)
Utilization review Completed Zidan Helget RN BSN   

## 2014-07-10 NOTE — Progress Notes (Signed)
NURSING PROGRESS NOTE  [name] [id number] Discharge Data: 07/10/2014 7:29 PM Attending Provider: No att. providers found HRC:BULAGTX,MIWOEH H, MD   [name] to be D/C'd Home per MD order.    All IV's discontinued and monitored for bleeding.  All belongings returned to patient for patient to take home.  AVS summary reviewed with patient.   [facility] service called for patient pick-up. Patient left floor via wheelchair, escorted by NT.  Last Documented Vital Signs:  Blood pressure 134/54, pulse 78, temperature 98.9 F (37.2 C), temperature source Oral, resp. rate 20, height 5\' 4"  (1.626 m), weight 71 kg (156 lb 8.4 oz), SpO2 99 %.  [name] D

## 2014-07-10 NOTE — Progress Notes (Signed)
Jeannetta Nap states she is patient's daughter, and called requesting information about pt. Name was not under emergency contacts. Obtained permission to share update with Ms. Trexler from patient.

## 2014-07-13 ENCOUNTER — Telehealth: Payer: Self-pay | Admitting: Family Medicine
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
## 2014-07-13 NOTE — Telephone Encounter (Signed)
Please advise 

## 2014-07-14 ENCOUNTER — Other Ambulatory Visit: Payer: Self-pay | Admitting: Family Medicine

## 2014-07-14 MED ORDER — CIPROFLOXACIN HCL 500 MG PO TABS: 500.0000 mg | ORAL_TABLET | Freq: Two times a day (BID) | ORAL | Status: DC

## 2014-07-14 NOTE — Telephone Encounter (Signed)
LMOM for pt stating Rx was sent to pharmacy.

## 2014-07-14 NOTE — Telephone Encounter (Signed)
cipro rx sent in.  Pls notify pt.

## 2014-07-15 ENCOUNTER — Emergency Department (HOSPITAL_COMMUNITY)
Admission: EM | Admit: 2014-07-15 | Discharge: 2014-07-17 | Disposition: A | Discharge status: Other Acute Inpatient Hospital | Payer: No Typology Code available for payment source | Source: Home / Self Care | Attending: Emergency Medicine | Admitting: Emergency Medicine

## 2014-07-15 ENCOUNTER — Encounter (HOSPITAL_COMMUNITY): Payer: Self-pay | Admitting: Emergency Medicine

## 2014-07-15 DIAGNOSIS — Z8739 Personal history of other diseases of the musculoskeletal system and connective tissue: Secondary | ICD-10-CM

## 2014-07-15 DIAGNOSIS — Z91040 Latex allergy status: Secondary | ICD-10-CM | POA: Insufficient documentation

## 2014-07-15 DIAGNOSIS — G40909 Epilepsy, unspecified, not intractable, without status epilepticus: Secondary | ICD-10-CM

## 2014-07-15 DIAGNOSIS — F319 Bipolar disorder, unspecified: Secondary | ICD-10-CM

## 2014-07-15 DIAGNOSIS — K219 Gastro-esophageal reflux disease without esophagitis: Secondary | ICD-10-CM

## 2014-07-15 DIAGNOSIS — Z79899 Other long term (current) drug therapy: Secondary | ICD-10-CM

## 2014-07-15 DIAGNOSIS — E669 Obesity, unspecified: Secondary | ICD-10-CM

## 2014-07-15 DIAGNOSIS — I2510 Atherosclerotic heart disease of native coronary artery without angina pectoris: Secondary | ICD-10-CM | POA: Insufficient documentation

## 2014-07-15 DIAGNOSIS — R440 Auditory hallucinations: Secondary | ICD-10-CM

## 2014-07-15 DIAGNOSIS — J449 Chronic obstructive pulmonary disease, unspecified: Secondary | ICD-10-CM | POA: Insufficient documentation

## 2014-07-15 DIAGNOSIS — G8929 Other chronic pain: Secondary | ICD-10-CM

## 2014-07-15 DIAGNOSIS — I10 Essential (primary) hypertension: Secondary | ICD-10-CM

## 2014-07-15 DIAGNOSIS — Z9889 Other specified postprocedural states: Secondary | ICD-10-CM

## 2014-07-15 DIAGNOSIS — Z720 Tobacco use: Secondary | ICD-10-CM

## 2014-07-15 DIAGNOSIS — F3164 Bipolar disorder, current episode mixed, severe, with psychotic features: Secondary | ICD-10-CM | POA: Diagnosis not present

## 2014-07-15 DIAGNOSIS — E785 Hyperlipidemia, unspecified: Secondary | ICD-10-CM

## 2014-07-15 DIAGNOSIS — Z85828 Personal history of other malignant neoplasm of skin: Secondary | ICD-10-CM | POA: Insufficient documentation

## 2014-07-15 DIAGNOSIS — Z880 Allergy status to penicillin: Secondary | ICD-10-CM | POA: Insufficient documentation

## 2014-07-15 DIAGNOSIS — Z7952 Long term (current) use of systemic steroids: Secondary | ICD-10-CM

## 2014-07-15 DIAGNOSIS — Z792 Long term (current) use of antibiotics: Secondary | ICD-10-CM

## 2014-07-15 DIAGNOSIS — F22 Delusional disorders: Secondary | ICD-10-CM | POA: Diagnosis not present

## 2014-07-15 LAB — CBC WITH DIFFERENTIAL/PLATELET
BASOS ABS: 0.1 10*3/uL (ref 0.0–0.1)
Basophils Relative: 1 % (ref 0–1)
Eosinophils Absolute: 0.2 10*3/uL (ref 0.0–0.7)
Eosinophils Relative: 2 % (ref 0–5)
HCT: 40.8 % (ref 36.0–46.0)
HEMOGLOBIN: 13.8 g/dL (ref 12.0–15.0)
LYMPHS ABS: 3.2 10*3/uL (ref 0.7–4.0)
LYMPHS PCT: 23 % (ref 12–46)
MCH: 29.1 pg (ref 26.0–34.0)
MCHC: 33.8 g/dL (ref 30.0–36.0)
MCV: 85.9 fL (ref 78.0–100.0)
MONO ABS: 0.8 10*3/uL (ref 0.1–1.0)
Monocytes Relative: 6 % (ref 3–12)
NEUTROS ABS: 9.4 10*3/uL — AB (ref 1.7–7.7)
Neutrophils Relative %: 68 % (ref 43–77)
Platelets: 429 10*3/uL — ABNORMAL HIGH (ref 150–400)
RBC: 4.75 MIL/uL (ref 3.87–5.11)
RDW: 14.6 % (ref 11.5–15.5)
WBC: 13.7 10*3/uL — ABNORMAL HIGH (ref 4.0–10.5)

## 2014-07-15 LAB — URINALYSIS, ROUTINE W REFLEX MICROSCOPIC
Bilirubin Urine: NEGATIVE
Glucose, UA: NEGATIVE mg/dL
HGB URINE DIPSTICK: NEGATIVE
Ketones, ur: NEGATIVE mg/dL
Leukocytes, UA: NEGATIVE
NITRITE: NEGATIVE
Protein, ur: NEGATIVE mg/dL
SPECIFIC GRAVITY, URINE: 1.02 (ref 1.005–1.030)
Urobilinogen, UA: 0.2 mg/dL (ref 0.0–1.0)
pH: 7 (ref 5.0–8.0)

## 2014-07-15 LAB — BASIC METABOLIC PANEL
Anion gap: 12 (ref 5–15)
BUN: 6 mg/dL (ref 6–23)
CO2: 28 meq/L (ref 19–32)
CREATININE: 0.66 mg/dL (ref 0.50–1.10)
Calcium: 9.8 mg/dL (ref 8.4–10.5)
Chloride: 100 mEq/L (ref 96–112)
GFR calc Af Amer: >90 mL/min (ref >90)
GFR calc non Af Amer: >90 mL/min (ref >90)
GLUCOSE: 103 mg/dL — AB (ref 70–99)
Potassium: 3.5 mEq/L — ABNORMAL LOW (ref 3.7–5.3)
Sodium: 140 mEq/L (ref 137–147)

## 2014-07-15 LAB — RAPID URINE DRUG SCREEN, HOSP PERFORMED
Amphetamines: NOT DETECTED
Barbiturates: NOT DETECTED
Benzodiazepines: NOT DETECTED
COCAINE: NOT DETECTED
OPIATES: NOT DETECTED
TETRAHYDROCANNABINOL: NOT DETECTED

## 2014-07-15 LAB — ETHANOL: Alcohol, Ethyl (B): <11 mg/dL (ref 0–11)

## 2014-07-15 MED ORDER — PRAVASTATIN SODIUM 40 MG PO TABS
80.0000 mg | ORAL_TABLET | Freq: Every day | ORAL | Status: DC
  Administered 2014-07-16: 80 mg via ORAL
  Filled 2014-07-15: qty 1
  Filled 2014-07-15: qty 2

## 2014-07-15 MED ORDER — ALUM & MAG HYDROXIDE-SIMETH 200-200-20 MG/5ML PO SUSP: 30.0000 mL | ORAL | Status: DC | PRN

## 2014-07-15 MED ORDER — ONDANSETRON HCL 4 MG PO TABS
4.0000 mg | ORAL_TABLET | Freq: Three times a day (TID) | ORAL | Status: DC | PRN
Start: 2014-07-15 — End: 2014-07-17

## 2014-07-15 MED ORDER — CARVEDILOL 3.125 MG PO TABS
6.2500 mg | ORAL_TABLET | Freq: Two times a day (BID) | ORAL | Status: DC
  Administered 2014-07-16 – 2014-07-17 (×2): 6.25 mg via ORAL
  Filled 2014-07-15 (×2): qty 1
  Filled 2014-07-15 (×2): qty 2
  Filled 2014-07-15: qty 1

## 2014-07-15 MED ORDER — LORAZEPAM 1 MG PO TABS
1.0000 mg | ORAL_TABLET | Freq: Three times a day (TID) | ORAL | Status: DC | PRN
  Administered 2014-07-16 – 2014-07-17 (×2): 1 mg via ORAL
  Filled 2014-07-15 (×2): qty 1

## 2014-07-15 MED ORDER — GLIMEPIRIDE 1 MG PO TABS: 1.0000 mg | ORAL_TABLET | Freq: Every day | ORAL | Status: DC

## 2014-07-15 MED ORDER — LORATADINE 10 MG PO TABS
10.0000 mg | ORAL_TABLET | Freq: Every day | ORAL | Status: DC
  Administered 2014-07-16 – 2014-07-17 (×2): 10 mg via ORAL
  Filled 2014-07-15 (×2): qty 1

## 2014-07-15 MED ORDER — TIZANIDINE HCL 4 MG PO TABS
4.0000 mg | ORAL_TABLET | Freq: Three times a day (TID) | ORAL | Status: DC
  Administered 2014-07-16 – 2014-07-17 (×2): 4 mg via ORAL
  Filled 2014-07-15 (×10): qty 1

## 2014-07-15 MED ORDER — IBUPROFEN 400 MG PO TABS: 600.0000 mg | ORAL_TABLET | Freq: Three times a day (TID) | ORAL | Status: DC | PRN

## 2014-07-15 MED ORDER — ASPIRIN EC 81 MG PO TBEC
81.0000 mg | DELAYED_RELEASE_TABLET | Freq: Every day | ORAL | Status: DC
  Administered 2014-07-16 – 2014-07-17 (×2): 81 mg via ORAL
  Filled 2014-07-15 (×2): qty 1

## 2014-07-15 MED ORDER — ZOLPIDEM TARTRATE 5 MG PO TABS: 5.0000 mg | ORAL_TABLET | Freq: Every evening | ORAL | Status: DC | PRN

## 2014-07-15 MED ORDER — DULOXETINE HCL 20 MG PO CPEP
60.0000 mg | ORAL_CAPSULE | Freq: Every day | ORAL | Status: DC
  Administered 2014-07-16 – 2014-07-17 (×2): 60 mg via ORAL
  Filled 2014-07-15 (×2): qty 3

## 2014-07-15 MED ORDER — BENAZEPRIL HCL 10 MG PO TABS
20.0000 mg | ORAL_TABLET | Freq: Every day | ORAL | Status: DC
  Administered 2014-07-17: 20 mg via ORAL
  Filled 2014-07-15: qty 1
  Filled 2014-07-15: qty 2
  Filled 2014-07-15: qty 1

## 2014-07-15 MED ORDER — IPRATROPIUM-ALBUTEROL 0.5-2.5 (3) MG/3ML IN SOLN: 3.0000 mL | Freq: Four times a day (QID) | RESPIRATORY_TRACT | Status: DC | PRN

## 2014-07-15 MED ORDER — AMLODIPINE BESYLATE 5 MG PO TABS
10.0000 mg | ORAL_TABLET | Freq: Every day | ORAL | Status: DC
  Administered 2014-07-17: 10 mg via ORAL
  Filled 2014-07-15 (×2): qty 2

## 2014-07-15 MED ORDER — ACETAMINOPHEN 325 MG PO TABS
650.0000 mg | ORAL_TABLET | ORAL | Status: DC | PRN
  Filled 2014-07-15: qty 2

## 2014-07-15 MED ORDER — CIPROFLOXACIN HCL 250 MG PO TABS
500.0000 mg | ORAL_TABLET | Freq: Two times a day (BID) | ORAL | Status: DC
  Administered 2014-07-16 – 2014-07-17 (×3): 500 mg via ORAL
  Filled 2014-07-15 (×4): qty 2
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
## 2014-07-15 NOTE — ED Notes (Signed)
Patient also reported, "I was in the hospital last week and they said I was DOA."
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
## 2014-07-16 LAB — CBG MONITORING, ED: Glucose-Capillary: 101 mg/dL — ABNORMAL HIGH (ref 70–99)

## 2014-07-16 MED ORDER — GABAPENTIN 100 MG PO CAPS
200.0000 mg | ORAL_CAPSULE | Freq: Every day | ORAL | Status: DC
  Administered 2014-07-16: 200 mg via ORAL
  Filled 2014-07-16: qty 2

## 2014-07-16 MED ORDER — GLIMEPIRIDE 2 MG PO TABS
1.0000 mg | ORAL_TABLET | Freq: Every day | ORAL | Status: DC
  Administered 2014-07-16 – 2014-07-17 (×2): 1 mg via ORAL
  Filled 2014-07-16: qty 0.5
  Filled 2014-07-16 (×2): qty 1

## 2014-07-16 NOTE — ED Provider Notes (Signed)
Patient requested gabapentin for leg pain, reportedly telling nurse that she takes it 4 times per day. I spoke with the hospital pharmacist. Patient verified gabapentin 200 mg at bedtime earlier today with pharmacy technician. We will start by dosing gabapentin 200 mg daily at bedtime. If leg pain is not improved adequately dosage may be increased to 300 mg 3 times daily for leg pain  Orlie Dakin, MD 07/16/14 2024

## 2014-07-16 NOTE — ED Notes (Signed)
Pt awake and eating at this time

## 2014-07-16 NOTE — ED Notes (Signed)
Patient stating that she is pregnant and that she feels the baby's head trying to come out. Patient reported, "I can feel the baby's head, but I am too small and it's not going to survive."

## 2014-07-16 NOTE — ED Notes (Signed)
Patient stated she would not take the medication. Patient stated, "I am pregnant and I'm afraid it will harm my baby. You girls are persistent."

## 2014-07-16 NOTE — BH Assessment (Signed)
[name], [facility] at [facility], confirms adult unit is at capacity. Contacted the following facilities for placement:  BED AVAILABLE, FAXED CLINICAL INFORMATION: [facility], per Lyndel Safe, per [facility], per [facility], per [facility]  AT CAPACITY: [facility], per Audie Box, per [facility], per [facility], per [facility], per [facility], per [facility], per Seleta Rhymes, per [facility], per Clydene Fake, per [facility], per Jairo Ben, per [facility], per [facility] per [facility], per Larene [facility]  NO RESPONSE: [facility]   [place], [place], [facility] Triage Specialist [phone]

## 2014-07-16 NOTE — ED Notes (Signed)
Spoke with [facility], which the pt was referred too, at this time they state they "do not feel inclined to accept the pt but would like to see a TSH and cognitive exam, but will most likely not be incline to accept the pt after results"

## 2014-07-16 NOTE — ED Notes (Signed)
Pt still sleeping, did not eat her breakfast. Went in to see if pt would like to get up, she is easily arousalibe and states she does not want to get up.

## 2014-07-16 NOTE — ED Notes (Signed)
Patient reporting, "Don't leave me in here alone."

## 2014-07-16 NOTE — BH Assessment (Signed)
Assessment completed. Consulted Waylan Boga, NP who recommended inpatient treatment. Dr. Venora Maples has been informed of the recommendation.
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
## 2014-07-16 NOTE — Progress Notes (Signed)
[facility] contacted the following gero-psych facilities for placement:  AT CAPACITY 1)[facility] 2)[facility] 3)[facility] 4)[facility]  FAXED REFERRALS 1)[facility]-faxed for review for Monday per Melody request 2)[facility] 3)[facility] 5)[facility]

## 2014-07-17 ENCOUNTER — Inpatient Hospital Stay (HOSPITAL_COMMUNITY)
Admission: AD | Admit: 2014-07-17 | Discharge: 2014-07-25 | DRG: 885 | Disposition: A | Discharge status: Home Health | Payer: No Typology Code available for payment source | Source: Intra-hospital | Attending: Psychiatry | Admitting: Psychiatry

## 2014-07-17 ENCOUNTER — Encounter (HOSPITAL_COMMUNITY): Payer: Self-pay

## 2014-07-17 DIAGNOSIS — I25119 Atherosclerotic heart disease of native coronary artery with unspecified angina pectoris: Secondary | ICD-10-CM | POA: Diagnosis present

## 2014-07-17 DIAGNOSIS — F29 Unspecified psychosis not due to a substance or known physiological condition: Secondary | ICD-10-CM | POA: Diagnosis present

## 2014-07-17 DIAGNOSIS — R45850 Homicidal ideations: Secondary | ICD-10-CM | POA: Diagnosis not present

## 2014-07-17 DIAGNOSIS — E785 Hyperlipidemia, unspecified: Secondary | ICD-10-CM | POA: Diagnosis present

## 2014-07-17 DIAGNOSIS — Z590 Homelessness: Secondary | ICD-10-CM

## 2014-07-17 DIAGNOSIS — F17210 Nicotine dependence, cigarettes, uncomplicated: Secondary | ICD-10-CM | POA: Diagnosis present

## 2014-07-17 DIAGNOSIS — J449 Chronic obstructive pulmonary disease, unspecified: Secondary | ICD-10-CM | POA: Diagnosis present

## 2014-07-17 DIAGNOSIS — F3164 Bipolar disorder, current episode mixed, severe, with psychotic features: Secondary | ICD-10-CM | POA: Diagnosis present

## 2014-07-17 DIAGNOSIS — Z79899 Other long term (current) drug therapy: Secondary | ICD-10-CM

## 2014-07-17 DIAGNOSIS — I10 Essential (primary) hypertension: Secondary | ICD-10-CM | POA: Diagnosis present

## 2014-07-17 DIAGNOSIS — Z7689 Persons encountering health services in other specified circumstances: Secondary | ICD-10-CM

## 2014-07-17 DIAGNOSIS — F3130 Bipolar disorder, current episode depressed, mild or moderate severity, unspecified: Secondary | ICD-10-CM | POA: Diagnosis present

## 2014-07-17 DIAGNOSIS — H5442 Blindness, left eye, normal vision right eye: Secondary | ICD-10-CM | POA: Diagnosis present

## 2014-07-17 DIAGNOSIS — G40909 Epilepsy, unspecified, not intractable, without status epilepticus: Secondary | ICD-10-CM

## 2014-07-17 DIAGNOSIS — H9193 Unspecified hearing loss, bilateral: Secondary | ICD-10-CM | POA: Diagnosis present

## 2014-07-17 DIAGNOSIS — F22 Delusional disorders: Secondary | ICD-10-CM | POA: Diagnosis present

## 2014-07-17 DIAGNOSIS — R45851 Suicidal ideations: Secondary | ICD-10-CM | POA: Diagnosis not present

## 2014-07-17 LAB — GLUCOSE, CAPILLARY: GLUCOSE-CAPILLARY: 99 mg/dL (ref 70–99)

## 2014-07-17 MED ORDER — CIPROFLOXACIN HCL 500 MG PO TABS
500.0000 mg | ORAL_TABLET | Freq: Two times a day (BID) | ORAL | Status: AC
  Administered 2014-07-17 – 2014-07-21 (×8): 500 mg via ORAL
  Filled 2014-07-17 (×6): qty 1
  Filled 2014-07-17: qty 2
  Filled 2014-07-17: qty 1
  Filled 2014-07-17: qty 2
  Filled 2014-07-17: qty 1

## 2014-07-17 MED ORDER — PRAVASTATIN SODIUM 40 MG PO TABS
80.0000 mg | ORAL_TABLET | Freq: Every day | ORAL | Status: DC
  Administered 2014-07-18 – 2014-07-25 (×8): 80 mg via ORAL
  Filled 2014-07-17 (×3): qty 1
  Filled 2014-07-17: qty 28
  Filled 2014-07-17 (×5): qty 1
  Filled 2014-07-17: qty 28
  Filled 2014-07-17: qty 1

## 2014-07-17 MED ORDER — ALBUTEROL SULFATE (2.5 MG/3ML) 0.083% IN NEBU: 2.5000 mg | INHALATION_SOLUTION | Freq: Four times a day (QID) | RESPIRATORY_TRACT | Status: DC | PRN

## 2014-07-17 MED ORDER — IPRATROPIUM-ALBUTEROL 18-103 MCG/ACT IN AERO
2.0000 | INHALATION_SPRAY | Freq: Four times a day (QID) | RESPIRATORY_TRACT | Status: DC | PRN
Start: 2014-07-17 — End: 2014-07-25
  Filled 2014-07-17: qty 14.7

## 2014-07-17 MED ORDER — DULOXETINE HCL 60 MG PO CPEP
60.0000 mg | ORAL_CAPSULE | Freq: Every day | ORAL | Status: DC
Start: 2014-07-18 — End: 2014-07-25
  Administered 2014-07-18 – 2014-07-25 (×8): 60 mg via ORAL
  Filled 2014-07-17 (×2): qty 1
  Filled 2014-07-17: qty 14
  Filled 2014-07-17: qty 1
  Filled 2014-07-17: qty 14
  Filled 2014-07-17 (×6): qty 1

## 2014-07-17 MED ORDER — CLONAZEPAM 0.5 MG PO TABS
0.5000 mg | ORAL_TABLET | Freq: Three times a day (TID) | ORAL | Status: DC | PRN
  Administered 2014-07-17 – 2014-07-20 (×8): 0.5 mg via ORAL
  Filled 2014-07-17 (×9): qty 1

## 2014-07-17 MED ORDER — GLIMEPIRIDE 2 MG PO TABS
2.0000 mg | ORAL_TABLET | Freq: Every day | ORAL | Status: DC
  Administered 2014-07-18 – 2014-07-25 (×8): 2 mg via ORAL
  Filled 2014-07-17: qty 1
  Filled 2014-07-17: qty 14
  Filled 2014-07-17: qty 1
  Filled 2014-07-17: qty 14
  Filled 2014-07-17 (×7): qty 1

## 2014-07-17 MED ORDER — METHOCARBAMOL 500 MG PO TABS
500.0000 mg | ORAL_TABLET | Freq: Three times a day (TID) | ORAL | Status: DC | PRN
  Administered 2014-07-17 – 2014-07-24 (×5): 500 mg via ORAL
  Filled 2014-07-17: qty 20
  Filled 2014-07-17: qty 1
  Filled 2014-07-17: qty 20
  Filled 2014-07-17: qty 1
  Filled 2014-07-17: qty 20
  Filled 2014-07-17 (×2): qty 1
  Filled 2014-07-17: qty 20
  Filled 2014-07-17: qty 1

## 2014-07-17 MED ORDER — OLANZAPINE 5 MG PO TBDP
5.0000 mg | ORAL_TABLET | Freq: Three times a day (TID) | ORAL | Status: DC | PRN
  Administered 2014-07-23 – 2014-07-24 (×2): 5 mg via ORAL
  Filled 2014-07-17: qty 1

## 2014-07-17 MED ORDER — OMEGA-3 FATTY ACIDS 1000 MG PO CAPS: 2.0000 g | ORAL_CAPSULE | Freq: Every day | ORAL | Status: DC

## 2014-07-17 MED ORDER — CARVEDILOL 6.25 MG PO TABS
6.2500 mg | ORAL_TABLET | Freq: Two times a day (BID) | ORAL | Status: DC
  Administered 2014-07-18 – 2014-07-25 (×15): 6.25 mg via ORAL
  Filled 2014-07-17 (×7): qty 1
  Filled 2014-07-17: qty 28
  Filled 2014-07-17: qty 1
  Filled 2014-07-17: qty 28
  Filled 2014-07-17 (×5): qty 1
  Filled 2014-07-17 (×2): qty 28
  Filled 2014-07-17 (×5): qty 1

## 2014-07-17 MED ORDER — GLYCOPYRROLATE 1 MG PO TABS
2.0000 mg | ORAL_TABLET | Freq: Three times a day (TID) | ORAL | Status: DC | PRN
  Filled 2014-07-17: qty 2

## 2014-07-17 MED ORDER — MAGNESIUM HYDROXIDE 400 MG/5ML PO SUSP: 30.0000 mL | Freq: Every day | ORAL | Status: DC | PRN

## 2014-07-17 MED ORDER — LIDOCAINE 5 % EX PTCH
3.0000 | MEDICATED_PATCH | CUTANEOUS | Status: DC
  Administered 2014-07-18 – 2014-07-25 (×8): 3 via TRANSDERMAL
  Filled 2014-07-17 (×11): qty 3

## 2014-07-17 MED ORDER — ACETAMINOPHEN 325 MG PO TABS
650.0000 mg | ORAL_TABLET | Freq: Four times a day (QID) | ORAL | Status: DC | PRN
  Administered 2014-07-20 – 2014-07-23 (×5): 650 mg via ORAL
  Filled 2014-07-17 (×5): qty 2

## 2014-07-17 MED ORDER — OXYCODONE-ACETAMINOPHEN 5-325 MG PO TABS
1.0000 | ORAL_TABLET | Freq: Once | ORAL | Status: AC
  Administered 2014-07-17: 1 via ORAL
  Filled 2014-07-17: qty 1

## 2014-07-17 MED ORDER — GABAPENTIN 100 MG PO CAPS
100.0000 mg | ORAL_CAPSULE | Freq: Every day | ORAL | Status: DC
  Administered 2014-07-18 – 2014-07-24 (×6): 100 mg via ORAL
  Filled 2014-07-17 (×7): qty 1
  Filled 2014-07-17: qty 14
  Filled 2014-07-17 (×4): qty 1
  Filled 2014-07-17: qty 14

## 2014-07-17 MED ORDER — ALUM & MAG HYDROXIDE-SIMETH 200-200-20 MG/5ML PO SUSP: 30.0000 mL | ORAL | Status: DC | PRN

## 2014-07-17 MED ORDER — AMLODIPINE BESYLATE 10 MG PO TABS
10.0000 mg | ORAL_TABLET | Freq: Every day | ORAL | Status: DC
Start: 2014-07-18 — End: 2014-07-25
  Administered 2014-07-18 – 2014-07-25 (×8): 10 mg via ORAL
  Filled 2014-07-17 (×4): qty 1
  Filled 2014-07-17 (×2): qty 14
  Filled 2014-07-17 (×5): qty 1

## 2014-07-17 MED ORDER — ASPIRIN EC 81 MG PO TBEC
81.0000 mg | DELAYED_RELEASE_TABLET | Freq: Every day | ORAL | Status: DC
  Administered 2014-07-18 – 2014-07-25 (×8): 81 mg via ORAL
  Filled 2014-07-17 (×10): qty 1

## 2014-07-17 MED ORDER — OXYCODONE-ACETAMINOPHEN 5-325 MG PO TABS
1.0000 | ORAL_TABLET | Freq: Two times a day (BID) | ORAL | Status: DC | PRN
  Administered 2014-07-17 – 2014-07-25 (×17): 1 via ORAL
  Filled 2014-07-17 (×17): qty 1

## 2014-07-17 MED ORDER — ESTRADIOL 0.1 MG/GM VA CREA
1.0000 | TOPICAL_CREAM | Freq: Every day | VAGINAL | Status: DC
  Administered 2014-07-19 – 2014-07-25 (×7): 1 via VAGINAL
  Filled 2014-07-17 (×2): qty 42.5

## 2014-07-17 MED ORDER — PANTOPRAZOLE SODIUM 40 MG PO TBEC
40.0000 mg | DELAYED_RELEASE_TABLET | Freq: Every day | ORAL | Status: DC
  Administered 2014-07-18 – 2014-07-25 (×8): 40 mg via ORAL
  Filled 2014-07-17 (×10): qty 1

## 2014-07-17 NOTE — Progress Notes (Signed)
Patient ID: [name], female   DOB: 1945-09-20, 68 y.o.   MRN: [id number] D: Patient reports A/VH. Pt is delusional /paranoid stating people are coming out of the vent to hurt her. Pt reports her PCP prescribed for her to have [employer], low sodium diet, and her pain medication should be q4 instead of q12. Pt is soft spoken but insist I call her PCP to verify.  Pt denies suicidal /homicidal ideation intent and plan. Pt attended evening wrap up group and engaged in discussion. No acute distressed noted at this time.   A: Met with pt 1:1. PRN medications administered as prescribed. Writer encouraged pt to discuss feelings and talk to provider tomorrow about medication adjustment. Pt encouraged to come to staff with any questions or concerns.   R: Patient is safe on the unit. Pt agitated about pain medication regimen. Pt refuse scheduled evening medication stating "I don't beg".

## 2014-07-17 NOTE — ED Notes (Signed)
Pt requesting her morning meds, pt informed that she does not have any meds due at this time until after breakfast; pt verbalized understanding and asking for cup of water.  Cup of water given.

## 2014-07-17 NOTE — ED Notes (Signed)
Pt awake and eating meal. Calm and cooperative at this time.

## 2014-07-17 NOTE — Progress Notes (Signed)
MHT spoke with Dr.Glick about IVC for pt at [facility] request for acceptance.  Dr. Roxanne Mins does not think pt meets IVC criteria at this time.  MHT contacted Clide Deutscher of disposition.  Wyvonnia Dusky, MHT/NS
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
## 2014-07-17 NOTE — ED Notes (Signed)
[facility] called for transport at this time. [name]

## 2014-07-17 NOTE — ED Notes (Signed)
Pt sleeping; regular rise and fall of chest noted.

## 2014-07-18 ENCOUNTER — Encounter (HOSPITAL_COMMUNITY): Payer: Self-pay | Admitting: Psychiatry

## 2014-07-18 DIAGNOSIS — F3164 Bipolar disorder, current episode mixed, severe, with psychotic features: Principal | ICD-10-CM

## 2014-07-18 DIAGNOSIS — F3130 Bipolar disorder, current episode depressed, mild or moderate severity, unspecified: Secondary | ICD-10-CM | POA: Diagnosis present

## 2014-07-18 DIAGNOSIS — R45851 Suicidal ideations: Secondary | ICD-10-CM

## 2014-07-18 DIAGNOSIS — R45850 Homicidal ideations: Secondary | ICD-10-CM

## 2014-07-18 MED ORDER — BACITRACIN-NEOMYCIN-POLYMYXIN 400-5-5000 EX OINT
TOPICAL_OINTMENT | CUTANEOUS | Status: AC
  Filled 2014-07-18: qty 1

## 2014-07-18 MED ORDER — NICOTINE 14 MG/24HR TD PT24
14.0000 mg | MEDICATED_PATCH | Freq: Every day | TRANSDERMAL | Status: DC
  Administered 2014-07-18 – 2014-07-25 (×8): 14 mg via TRANSDERMAL
  Filled 2014-07-18 (×12): qty 1

## 2014-07-18 MED ORDER — BENZTROPINE MESYLATE 0.5 MG PO TABS
0.5000 mg | ORAL_TABLET | Freq: Two times a day (BID) | ORAL | Status: DC
  Administered 2014-07-18 – 2014-07-20 (×4): 0.5 mg via ORAL
  Filled 2014-07-18 (×9): qty 1

## 2014-07-18 MED ORDER — LOPERAMIDE HCL 2 MG PO CAPS
2.0000 mg | ORAL_CAPSULE | ORAL | Status: DC | PRN
  Administered 2014-07-18: 2 mg via ORAL
  Filled 2014-07-18: qty 1

## 2014-07-18 MED ORDER — BACITRACIN-NEOMYCIN-POLYMYXIN OINTMENT TUBE
TOPICAL_OINTMENT | Freq: Two times a day (BID) | CUTANEOUS | Status: DC
  Administered 2014-07-18 – 2014-07-19 (×3): via TOPICAL
  Administered 2014-07-20 – 2014-07-21 (×2): 15 via TOPICAL
  Administered 2014-07-22 – 2014-07-23 (×2): via TOPICAL
  Administered 2014-07-23 – 2014-07-25 (×3): 15 via TOPICAL
  Filled 2014-07-18: qty 1
  Filled 2014-07-18: qty 15

## 2014-07-18 MED ORDER — HALOPERIDOL 5 MG PO TABS
5.0000 mg | ORAL_TABLET | Freq: Two times a day (BID) | ORAL | Status: DC
  Administered 2014-07-18 – 2014-07-19 (×2): 5 mg via ORAL
  Filled 2014-07-18 (×7): qty 1
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
## 2014-07-18 NOTE — Progress Notes (Signed)
Patient ID: [name], female   DOB: 21-Nov-1945, 68 y.o.   MRN: [id number] D: Patient reports A/VH stating there are people trying to hurt. Pt did acknowledge feeling safe here. Pt denies suicidal /homicidal ideation intent and plan. Pt requested medication for anxiety. Pt attended evening wrap up group and engaged in discussion. No acute distressed noted at this time.   A: Met with pt 1:1. Medications administered as prescribed. Writer encouraged pt to discuss feelings. Pt encouraged to come to staff with any questions or concerns.   R: Patient is safe on the unit and cooperative.

## 2014-07-18 NOTE — Plan of Care (Signed)
Problem: Alteration in mood Goal: STG-Patient does not harm self or others Outcome: Progressing Pt is safe and has not harmed self or others

## 2014-07-18 NOTE — Progress Notes (Signed)
Patient ID: [name], female   DOB: March 24, 1946, 68 y.o.   MRN: [id number] PER STATE REGULATIONS 482.30  THIS CHART WAS REVIEWED FOR MEDICAL NECESSITY WITH RESPECT TO THE PATIENT'S ADMISSION/ DURATION OF STAY.  NEXT REVIEW DATE: 07/21/2014  Chauncy Lean, RN, BSN CASE MANAGER

## 2014-07-18 NOTE — [facility] Group Notes (Signed)
[facility] LCSW Aftercare Discharge Planning Group Note   07/18/2014 11:25 AM  Participation Quality:  Appropriate   Mood/Affect:  Depressed  Depression Rating:  8  Anxiety Rating:  8  Thoughts of Suicide:  No Will you contract for safety?   NA  Current AVH:  No  Plan for Discharge/Comments:  Pt reports that "stress and an unsafe living environment" are to blame for her admission. Pt reports that she sees an internist and her PCP for meds/pain management. She is open to therapy at [facility] in [place] and is requesting info for low income housing in [place] area.   Transportation Means: [facility]/medicare transport   Supports: none identified by pt "I'm trying to get in touch with my husband and daughter."   Smart, Alicia Amel
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
## 2014-07-18 NOTE — Plan of Care (Signed)
Problem: Alteration in thought process Goal: LTG-Patient behavior demonstrates decreased signs psychosis Pt reports no AVH, SI, or HI. Pt demonstrates paranoid ideations/delusional thinking at this time. Pt oriented to person/time/place. Pt open to medication management. Goal progressing. [place], LCSWA 07/18/2014 1:29 PM  Outcome: Not Progressing Pt denies AVH but stated this morning that she hear a voice say "I know where you are now" in regards to being in the hospital. Pt reports that she knows this is "reality not my mind." Goal: LTG-Patient verbalizes understanding importance med regimen (Patient verbalizes understanding of importance of medication regimen and need to continue outpatient care.)  Outcome: Progressing Pt is adherent to medication adjustments and is eager to learn about her new medications.  Problem: Alteration in mood Goal: STG-Patient does not harm self or others Outcome: Progressing Pt has not harmed self or others. 15 minute checks continually completed per protocol for pt safety.

## 2014-07-18 NOTE — Progress Notes (Addendum)
Chaplain provided support with pt in response to spiritual care consult / pt request.    Met with pt in treatment room of 500 hall.  Pt was welcoming of chaplain presence, stating that she wished to have someone to speak with about her faith.  Described having difficulty keeping faith in midst of fearful experiences - described fears and paranoia around her husband and the home in which she had been living.    During conversation, pt somewhat tangential, moving quickly to describing worries after being redirected to present.   Described her faith being a resource of strength in the midst of her fear.   She stated she recently began taking medication while at [facility] and hopes that this will "make me feel more like myself and allow me to keep my focus on God."  However, stated that if she is discharged and feels the medication is not working or is a hindrance to her, she will dispose of it.  Chaplain was encouraging and worked with pt to understand medication as a helpful resource.  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  Prayed with pt, whom requested prayers for strength and courage.   Will follow for continued support while Kobe is admitted.   Creta Levin MDiv  3:58 PM 07/18/2014

## 2014-07-18 NOTE — [facility] Group Notes (Signed)
[place] LCSW Group Therapy  07/18/2014 2:09 PM  Type of Therapy:  Group Therapy  Participation Level:  Active  Participation Quality:  Attentive  Affect:  Depressed and Flat  Cognitive:  Alert  Insight:  Limited  Engagement in Therapy:  Improving  Modes of Intervention:  Confrontation, Discussion, Education, Exploration, Limit-setting, Problem-solving, Rapport Building, Socialization and Support  Summary of Progress/Problems: Today's Topic: Overcoming Obstacles. Group members were asked to identify obstacles faced currently and process barriers involved in overcoming these obstacles. Patients identified steps necessary for overcoming these obstacles and explored obstacles that they have overcome in the past. [name] was attentive during today's processing group but did not actively participate in discussion. When asked to identify an obstacle that she has overcome in the past, [name] stated "I don't know." She demonstrates limited progress in the group setting and limited insight at this time. [name] joined discussion regarding gardening and stated that she helped her grandfather tend his garden as a child. Pt was pleasant during group.   Smart, Asya Derryberry LCSWA 07/18/2014, 2:09 PM

## 2014-07-19 LAB — HEMOGLOBIN A1C
HEMOGLOBIN A1C: 5.4 % (ref <5.7)
MEAN PLASMA GLUCOSE: 108 mg/dL (ref <117)

## 2014-07-19 LAB — LIPID PANEL
Cholesterol: 137 mg/dL (ref 0–200)
HDL: 37 mg/dL — ABNORMAL LOW (ref >39)
LDL Cholesterol: 71 mg/dL (ref 0–99)
Total CHOL/HDL Ratio: 3.7 RATIO
Triglycerides: 144 mg/dL (ref <150)
VLDL: 29 mg/dL (ref 0–40)

## 2014-07-19 LAB — TSH: TSH: 0.986 u[IU]/mL (ref 0.350–4.500)

## 2014-07-19 MED ORDER — ZINC OXIDE 12.8 % EX OINT
TOPICAL_OINTMENT | CUTANEOUS | Status: DC | PRN
  Filled 2014-07-19: qty 56.7

## 2014-07-19 MED ORDER — HALOPERIDOL 5 MG PO TABS
5.0000 mg | ORAL_TABLET | Freq: Every day | ORAL | Status: DC
  Administered 2014-07-20: 5 mg via ORAL
  Filled 2014-07-19 (×2): qty 1

## 2014-07-19 MED ORDER — HALOPERIDOL 5 MG PO TABS
10.0000 mg | ORAL_TABLET | Freq: Every day | ORAL | Status: DC
  Administered 2014-07-19: 10 mg via ORAL
  Filled 2014-07-19 (×3): qty 2

## 2014-07-19 MED ORDER — ZINC OXIDE 40 % EX OINT
TOPICAL_OINTMENT | CUTANEOUS | Status: DC | PRN
  Administered 2014-07-19 – 2014-07-22 (×2): 1 via TOPICAL
  Filled 2014-07-19: qty 114

## 2014-07-19 MED ORDER — LAMOTRIGINE 25 MG PO TABS
25.0000 mg | ORAL_TABLET | Freq: Every day | ORAL | Status: DC
  Administered 2014-07-19 – 2014-07-20 (×2): 25 mg via ORAL
  Filled 2014-07-19 (×4): qty 1

## 2014-07-19 NOTE — Plan of Care (Signed)
Problem: Alteration in thought process Goal: LTG-Patient has not harmed self or others in at least 2 days Outcome: Completed/Met Date Met:  07/19/14 Pt has not harmed self or others. Goal: LTG-Patient verbalizes understanding importance med regimen (Patient verbalizes understanding of importance of medication regimen and need to continue outpatient care.)  Outcome: Completed/Met Date Met:  07/19/14 Patient verbalizes the importance of her medication regimen and requests additional information on medications to further her understanding. Hand outs given.  Problem: Alteration in mood Goal: STG-Patient is able to attend at least 2 groups per day Outcome: Progressing Patient has been attending all unit groups today.

## 2014-07-19 NOTE — [facility] Group Notes (Signed)
Adult Psychoeducational Group Note  Date:  07/19/2014 Time:  9:39 PM  Group Topic/Focus:  Wrap-Up Group:   The focus of this group is to help patients review their daily goal of treatment and discuss progress on daily workbooks.  Participation Level:  Minimal  Participation Quality:  Appropriate  Affect:  Flat  Cognitive:  Appropriate  Insight: Good  Engagement in Group:  Limited  Modes of Intervention:  Discussion  Additional Comments:  [name] stated her day was all right.  She stated she talked to friends and tried to be quiet and still today.  She stated her support system was [name], [name] A 07/19/2014, 9:39 PM

## 2014-07-19 NOTE — Progress Notes (Addendum)
[name] [facility] MD Progress Note  07/19/2014 2:07 PM [name]  MRN:  [id number] Subjective: Pt states,' I need my depends pad since I am blistering up all over my groin". Objective: Patient seen and chart reviewed.Pt continues to be disorganized and delusional with some improvement. Pt today appears to be "fixed' on getting depend pad ,reports that she uses it at home. Pt continues to endorse AH and feels they are real and not imaginary. Pt per staff continues to be paranoid and psychotic. Pt requested some information to read about lamictal" mood stabilizer since it was discussed with pt that she could be started on it. Pt given educational material per nursing staff. Pt denies any SI/HI today ,however she is very paranoid and could be a potential danger to self or others. Denies side effects of medications.  Diagnosis:    DSM5: Primary Psychiatric Diagnosis: Bipolar disorder,type I,mixed severe,with psychotic features   Secondary Psychiatric Diagnosis: R/O PTSD   Non Psychiatric Diagnosis: SEE PMH     Total Time spent with patient: 30 minutes    ADL's:  Intact  Sleep: Fair  Appetite:  Fair   Psychiatric Specialty Exam: Physical Exam  ROS  Blood pressure 141/70, pulse 89, temperature 98.3 F (36.8 C), temperature source Oral, resp. rate 18, height 5\' 4"  (1.626 m), weight 70.444 kg (155 lb 4.8 oz), SpO2 99 %.Body mass index is 26.64 kg/(m^2).  General Appearance: Fairly Groomed  Engineer, water::  Fair  Speech:  Clear and Coherent  Volume:  Normal  Mood:  Anxious  Affect:  Appropriate  Thought Process:  Disorganized, Irrelevant and Loose  Orientation:  Full (Time, Place, and Person)  Thought Content:  Delusions, Hallucinations: Auditory Visual, Paranoid Ideation and Rumination  Suicidal Thoughts:  [place] could be a potential danger to self or others due to being very paranoid  Homicidal Thoughts:  No  Memory:  Immediate;   Poor Recent;   Poor Remote;   Poor   Judgement:  Impaired  Insight:  Lacking  Psychomotor Activity:  Restlessness  Concentration:  Fair  Recall:  [place]: Fair  Akathisia:  No  Handed:  Right  AIMS (if indicated):     Assets:  Communication Skills Desire for Improvement  Sleep:  Number of Hours: 6.5   Musculoskeletal: Strength & Muscle Tone: within normal limits Gait & Station: normal Patient leans: N/A  Current Medications: Current Facility-Administered Medications  Medication Dose Route Frequency Provider Last Rate Last Dose  . acetaminophen (TYLENOL) tablet 650 mg  650 mg Oral Q6H PRN Benjamine Mola, FNP      . albuterol (PROVENTIL) (2.5 MG/3ML) 0.083% nebulizer solution 2.5 mg  2.5 mg Nebulization Q6H PRN Benjamine Mola, FNP      . albuterol-ipratropium (COMBIVENT) inhaler 2 puff  2 puff Inhalation Q6H PRN Benjamine Mola, FNP      . alum & mag hydroxide-simeth (MAALOX/MYLANTA) 200-200-20 MG/5ML suspension 30 mL  30 mL Oral Q4H PRN Benjamine Mola, FNP      . amLODipine (NORVASC) tablet 10 mg  10 mg Oral Daily Benjamine Mola, FNP   10 mg at 07/19/14 0849  . aspirin EC tablet 81 mg  81 mg Oral Daily Benjamine Mola, FNP   81 mg at 07/19/14 0849  . benztropine (COGENTIN) tablet 0.5 mg  0.5 mg Oral BID Ursula Alert, MD   0.5 mg at 07/19/14 0849  . carvedilol (COREG) tablet 6.25 mg  6.25 mg Oral BID  WC Benjamine Mola, FNP   6.25 mg at 07/19/14 0849  . ciprofloxacin (CIPRO) tablet 500 mg  500 mg Oral BID Benjamine Mola, FNP   500 mg at 07/19/14 0947  . clonazePAM (KLONOPIN) tablet 0.5 mg  0.5 mg Oral TID PRN Benjamine Mola, FNP   0.5 mg at 07/19/14 1155  . DULoxetine (CYMBALTA) DR capsule 60 mg  60 mg Oral Daily Benjamine Mola, FNP   60 mg at 07/19/14 0858  . estradiol (ESTRACE) vaginal cream 1 Applicatorful  1 Applicatorful Vaginal Daily Benjamine Mola, FNP   1 Applicatorful at 09/38/18 [phone]  . gabapentin (NEURONTIN) capsule 100 mg  100 mg Oral QHS Benjamine Mola, FNP   100 mg at  07/18/14 2108  . glimepiride (AMARYL) tablet 2 mg  2 mg Oral Q breakfast Benjamine Mola, FNP   2 mg at 07/19/14 0850  . glycopyrrolate (ROBINUL) tablet 2 mg  2 mg Oral TID PRN Benjamine Mola, FNP      . haloperidol (HALDOL) tablet 5 mg  5 mg Oral BID Ursula Alert, MD   5 mg at 07/19/14 0849  . lamoTRIgine (LAMICTAL) tablet 25 mg  25 mg Oral Daily Karman Veney, MD   25 mg at 07/19/14 1155  . lidocaine (LIDODERM) 5 % 3 patch  3 patch Transdermal Q24H Benjamine Mola, FNP   3 patch at 07/19/14 [phone]  . loperamide (IMODIUM) capsule 2 mg  2 mg Oral PRN Ursula Alert, MD   2 mg at 07/18/14 1250  . magnesium hydroxide (MILK OF MAGNESIA) suspension 30 mL  30 mL Oral Daily PRN Benjamine Mola, FNP      . methocarbamol (ROBAXIN) tablet 500 mg  500 mg Oral Q8H PRN Benjamine Mola, FNP   500 mg at 07/17/14 2001  . neomycin-bacitracin-polymyxin (NEOSPORIN) ointment   Topical BID Kelen Laura, MD      . nicotine (NICODERM CQ - dosed in mg/24 hours) patch 14 mg  14 mg Transdermal Daily Ursula Alert, MD   14 mg at 07/19/14 0948  . OLANZapine zydis (ZYPREXA) disintegrating tablet 5 mg  5 mg Oral Q8H PRN Benjamine Mola, FNP      . oxyCODONE-acetaminophen (PERCOCET/ROXICET) 5-325 MG per tablet 1 tablet  1 tablet Oral BID PRN Benjamine Mola, FNP   1 tablet at 07/19/14 1155  . pantoprazole (PROTONIX) EC tablet 40 mg  40 mg Oral Daily Benjamine Mola, FNP   40 mg at 07/19/14 0849  . pravastatin (PRAVACHOL) tablet 80 mg  80 mg Oral Daily Benjamine Mola, FNP   80 mg at 07/19/14 0848    Lab Results:  Results for orders placed or performed during the hospital encounter of 07/17/14 (from the past 48 hour(s))  Glucose, capillary     Status: None   Collection Time: 07/17/14  4:53 PM  Result Value Ref Range   Glucose-Capillary 99 70 - 99 mg/dL   Comment 1 Notify RN   Hemoglobin A1c     Status: None   Collection Time: 07/19/14  6:50 AM  Result Value Ref Range   Hgb A1c MFr Bld 5.4 <5.7 %    Comment: (NOTE)  According to the ADA Clinical Practice Recommendations for 2011, when HbA1c is used as a screening test:  >=6.5%   Diagnostic of Diabetes Mellitus           (if abnormal result is confirmed) 5.7-6.4%   Increased risk of developing Diabetes Mellitus References:Diagnosis and Classification of Diabetes Mellitus,Diabetes QJJH,4174,08(XKGYJ 1):S62-S69 and Standards of Medical Care in         Diabetes - 2011,Diabetes EHUD,1497,02 (Suppl 1):S11-S61. CORRECTED ON 11/17 AT 1059: PREVIOUSLY REPORTED AS 5.4 Reference range: <5.7    Mean Plasma Glucose 108 <117 mg/dL    Comment: Performed at [facility]  Lipid panel     Status: Abnormal   Collection Time: 07/19/14  6:50 AM  Result Value Ref Range   Cholesterol 137 0 - 200 mg/dL   Triglycerides 144 <150 mg/dL   HDL 37 (L) >39 mg/dL   Total CHOL/HDL Ratio 3.7 RATIO   VLDL 29 0 - 40 mg/dL   LDL Cholesterol 71 0 - 99 mg/dL    Comment:        Total Cholesterol/HDL:CHD Risk Coronary Heart Disease Risk Table                     Men   Women  1/2 Average Risk   3.4   3.3  Average Risk       5.0   4.4  2 X Average Risk   9.6   7.1  3 X Average Risk  23.4   11.0        Use the calculated Patient Ratio above and the CHD Risk Table to determine the patient's CHD Risk.        ATP III CLASSIFICATION (LDL):  <100     mg/dL   Optimal  100-129  mg/dL   Near or Above                    Optimal  130-159  mg/dL   Borderline  160-189  mg/dL   High  >190     mg/dL   Very High Performed at [facility]   TSH     Status: None   Collection Time: 07/19/14  6:50 AM  Result Value Ref Range   TSH 0.986 0.350 - 4.500 uIU/mL    Comment: Performed at [facility]    Physical Findings: AIMS: Facial and Oral Movements Muscles of Facial Expression: None, normal Lips and Perioral Area: None, normal Jaw: None, normal Tongue: None, normal,Extremity Movements Upper (arms,  wrists, hands, fingers): None, normal Lower (legs, knees, ankles, toes): None, normal, Trunk Movements Neck, shoulders, hips: None, normal, Overall Severity Severity of abnormal movements (highest score from questions above): None, normal Incapacitation due to abnormal movements: None, normal Patient's awareness of abnormal movements (rate only patient's report): No Awareness, Dental Status Current problems with teeth and/or dentures?: No Does patient usually wear dentures?: No  CIWA:    COWS:     Treatment Plan Summary: Daily contact with patient to assess and evaluate symptoms and progress in treatment Medication management Assessment: Pt is a 68 year old CF ,who presented with delusions,paranoia and hallucinations. Pt has a hx of bipolar disorder as well as several medical problems and is on polypharmacy had recent admission to medical floor for AMS..  Plan: Will increase Haldol to 15 mg po daily with Cogentin 0.5 mg po bid for eps. Will start Lamictal 25 mg daily po. Pt is willing to start trial after  reading the material on "lamictal". Will restart home medications where needed.  Will continue to monitor vitals ,medication compliance and treatment side effects while patient is here.  Will monitor for medical issues as well as call consult as needed.  Reviewed labs ,HDL slightly elevated. Pt to manage her diet at this time.TSH,HBA1C wnl. CSW will start working on disposition.  Patient to participate in therapeutic milieu .    Medical Decision Making Problem Points:  Established problem, stable/improving (1), Review of last therapy session (1) and Review of psycho-social stressors (1) Data Points:  Order Aims Assessment (2) Review or order clinical lab tests (1) Review and summation of old records (2) Review of medication regiment & side effects (2) Review of new medications or change in dosage (2)  I certify that inpatient services furnished can reasonably be expected to  improve the patient's condition.   Takeisha Cianci 07/19/2014, 2:07 PM

## 2014-07-19 NOTE — [facility] Group Notes (Signed)
[facility] Group Notes:  (Nursing/MHT/Case Management/Adjunct)  Date:  07/19/2014  Time:  9:30am  Type of Therapy:  Nurse Education  Participation Level:  Active  Participation Quality:  Appropriate  Affect:  Blunted    Cognitive:  Alert  Insight:  Appropriate  Engagement in Group:  Engaged  Modes of Intervention:  Education, Support and Recovery topic  Summary of Progress/Problems:  [name], [name] 07/19/2014, 1:25 PM

## 2014-07-19 NOTE — Progress Notes (Signed)
D: Patient is alert and oriented. Patient's mood and affect is animated, anxious but also pleasant. Pt's eye contact is fair. Pt remains paranoid and delusional stating "Someone is playing an awful dirty joke on me, I know this is not all in my head... I've got it on video." Pt denies SI/HI and does not report any hallucinations at this time. Patient reports her depression 2/10, hopelessness 0/10, and anxiety 4/10. 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 Ulcer is clean and pink with no drainage. Pt states she has had the pressure ulcer for "four months" and has tried "green tea, baby powder, I've tried everything to get rid of it." Pt reports her goal for the day is "less stress, finding a new home, contacting staff of advanced home care." Pt experiencing some incontinence/"leaking" today and complains of vaginal redness, "blisters." Pt reports back pain 8/10 and anxiety. Pt is attending groups. A: Absorbant incontinent underwear given to pt. Skin assessment completed; pressure ulcer prevention and care education reviewed with pt. Encouragement/Support provided to pt. Medication education hand out given to pt per pt request. PRN medications administered for pain and anxiety per providers orders (See MAR). Scheduled medications administered per providers orders (See MAR). 15 minute checks completed per protocol for pt safety. R: Pt cooperative and receptive to nursing interventions.

## 2014-07-19 NOTE — Plan of Care (Signed)
Problem: Alteration in thought process Goal: STG-Patient is able to discuss thoughts with staff Outcome: Progressing Pt is calm and cooperative this evening. Pt discussed thoughts with Probation officer.

## 2014-07-19 NOTE — Progress Notes (Signed)
D: Patient in he dayroom on approach.  Patient complains of anxiety.  Patient states her anxiety has been high today.  Patient delusional and thinks people are after her.  Patient states she is having auditory and visual hallucinations.  Patient denies SI.  Patient verbally contracts for safety. A: Staff to monitor Q 15 mins for safety.  Encouragement and support offered.  Scheduled medications administered per orders.  Klonopin administered prn for anxiety.   R: Patient remains safe on the unit.  Patient did not attend group tonight.  Patient  visible on the unit.  Patient taking administered medications.

## 2014-07-19 NOTE — [facility] Group Notes (Signed)
[facility] LCSW Group Therapy  07/19/2014 , 11:15 AM   Type of Therapy:  Group Therapy  Participation Level:  Active  Participation Quality:  Attentive  Affect:  Appropriate  Cognitive:  Alert  Insight:  Improving  Engagement in Therapy:  Engaged  Modes of Intervention:  Discussion, Exploration and Socialization  Summary of Progress/Problems: Today's group focused on the term Diagnosis.  Participants were asked to define the term, and then pronounce whether it is a negative, positive or neutral term. Keilyn stated that she feels threatened a lot.  Was not able to give details.  Talked about her faith and how important that is to her.  Vague.    Roque Lias B 07/19/2014 , 11:15 AM

## 2014-07-20 MED ORDER — HALOPERIDOL 5 MG PO TABS
10.0000 mg | ORAL_TABLET | Freq: Every day | ORAL | Status: DC
  Administered 2014-07-21 – 2014-07-22 (×2): 10 mg via ORAL
  Filled 2014-07-20 (×3): qty 2

## 2014-07-20 MED ORDER — LAMOTRIGINE 25 MG PO TABS
25.0000 mg | ORAL_TABLET | Freq: Two times a day (BID) | ORAL | Status: DC
  Administered 2014-07-20 – 2014-07-25 (×10): 25 mg via ORAL
  Filled 2014-07-20 (×6): qty 1
  Filled 2014-07-20: qty 28
  Filled 2014-07-20 (×2): qty 1
  Filled 2014-07-20 (×2): qty 28
  Filled 2014-07-20: qty 1
  Filled 2014-07-20: qty 28
  Filled 2014-07-20 (×4): qty 1

## 2014-07-20 MED ORDER — HYDROXYZINE HCL 25 MG PO TABS
25.0000 mg | ORAL_TABLET | Freq: Three times a day (TID) | ORAL | Status: DC | PRN
  Administered 2014-07-20 – 2014-07-22 (×4): 25 mg via ORAL
  Filled 2014-07-20 (×4): qty 1

## 2014-07-20 MED ORDER — BENZTROPINE MESYLATE 1 MG PO TABS
1.0000 mg | ORAL_TABLET | Freq: Two times a day (BID) | ORAL | Status: DC
  Administered 2014-07-20 – 2014-07-25 (×10): 1 mg via ORAL
  Filled 2014-07-20: qty 28
  Filled 2014-07-20: qty 1
  Filled 2014-07-20: qty 28
  Filled 2014-07-20 (×5): qty 1
  Filled 2014-07-20: qty 28
  Filled 2014-07-20 (×8): qty 1
  Filled 2014-07-20: qty 28
  Filled 2014-07-20: qty 1

## 2014-07-20 MED ORDER — HALOPERIDOL 5 MG PO TABS
5.0000 mg | ORAL_TABLET | Freq: Every day | ORAL | Status: DC
Start: 2014-07-20 — End: 2014-07-25
  Administered 2014-07-20 – 2014-07-24 (×5): 5 mg via ORAL
  Filled 2014-07-20 (×8): qty 1

## 2014-07-20 MED ORDER — CLONAZEPAM 0.5 MG PO TABS
0.5000 mg | ORAL_TABLET | Freq: Two times a day (BID) | ORAL | Status: DC | PRN
  Administered 2014-07-20 – 2014-07-21 (×3): 0.5 mg via ORAL
  Filled 2014-07-20 (×3): qty 1

## 2014-07-20 NOTE — Progress Notes (Signed)
D: Patient in the bed on approach.  Patient states she had a good day but states she is tired.  Patient did not get out of bed tonight.  Patient states she learned  Patient states she has not been sleeping well.  Patient conversation more appropriate tonight and no delusions observed with talking with patient.  Patient still having some paranoia.  Patient states she has learned that there are people out here to help her if she needs it. Patient denies SI/HI and denies AVH. A: Staff to monitor Q 15 mins for safety.  Encouragement and support offered.  Scheduled medications administered per orders.  No prn medications administered tonight. R: Patient remains safe on the unit.  Patient did not attend group tonight.  Patient not visible on the unit tonight.  Patient taking administered medications.

## 2014-07-20 NOTE — Progress Notes (Addendum)
[facility] MD Progress Note  07/20/2014 1:16 PM SHEYNA PETTIBONE  MRN:  [id number] Subjective: Pt states,' I feel better today." Objective: Patient seen and chart reviewed.Pt continues to be disorganized and delusional ,however she is improving. Pt has been delusional about some one spying on her in her apartment. Today she was fixed on 'drinking only bottled water" since she feels some one has been messing with her water supply at home. Pt continues to endorse AH ,but they are reducing. She reports Lamictal as helping and seems to be more pleasant and interactive today. Pt denies any SI/HI today ,however she is continues to be  paranoid and could be a potential danger to self or others. Denies side effects of medications.  Diagnosis:    DSM5: Primary Psychiatric Diagnosis: Bipolar disorder,type I,mixed severe,with psychotic features   Secondary Psychiatric Diagnosis: R/O PTSD   Non Psychiatric Diagnosis: SEE PMH     Total Time spent with patient: 30 minutes    ADL's:  Intact  Sleep: Fair  Appetite:  Fair   Psychiatric Specialty Exam: Physical Exam  ROS  Blood pressure 134/103, pulse 102, temperature 98.3 F (36.8 C), temperature source Oral, resp. rate 16, height 5\' 4"  (1.626 m), weight 70.444 kg (155 lb 4.8 oz), SpO2 99 %.Body mass index is 26.64 kg/(m^2).  General Appearance: Fairly Groomed  Engineer, water::  Fair  Speech:  Clear and Coherent  Volume:  Normal  Mood:  Anxious  Affect:  Appropriate  Thought Process:  Disorganized, Irrelevant and Loose improving  Orientation:  Full (Time, Place, and Person)  Thought Content:  Delusions, Hallucinations: Auditory Visual, Paranoid Ideation and Rumination  Suicidal Thoughts:  [place] could be a potential danger to self or others due to being very paranoid  Homicidal Thoughts:  No  Memory:  Immediate;   Poor Recent;   Poor Remote;   Poor  Judgement:  Impaired  Insight:  Lacking  Psychomotor Activity:  Restlessness   Concentration:  Fair  Recall:  [place]: Fair  Akathisia:  No  Handed:  Right  AIMS (if indicated):     Assets:  Communication Skills Desire for Improvement  Sleep:  Number of Hours: 6.75   Musculoskeletal: Strength & Muscle Tone: within normal limits Gait & Station: normal Patient leans: N/A  Current Medications: Current Facility-Administered Medications  Medication Dose Route Frequency Provider Last Rate Last Dose  . acetaminophen (TYLENOL) tablet 650 mg  650 mg Oral Q6H PRN Benjamine Mola, FNP      . albuterol (PROVENTIL) (2.5 MG/3ML) 0.083% nebulizer solution 2.5 mg  2.5 mg Nebulization Q6H PRN Benjamine Mola, FNP      . albuterol-ipratropium (COMBIVENT) inhaler 2 puff  2 puff Inhalation Q6H PRN Benjamine Mola, FNP      . alum & mag hydroxide-simeth (MAALOX/MYLANTA) 200-200-20 MG/5ML suspension 30 mL  30 mL Oral Q4H PRN Benjamine Mola, FNP      . amLODipine (NORVASC) tablet 10 mg  10 mg Oral Daily Benjamine Mola, FNP   10 mg at 07/20/14 0810  . aspirin EC tablet 81 mg  81 mg Oral Daily Benjamine Mola, FNP   81 mg at 07/20/14 0810  . benztropine (COGENTIN) tablet 0.5 mg  0.5 mg Oral BID Ursula Alert, MD   0.5 mg at 07/20/14 4970  . carvedilol (COREG) tablet 6.25 mg  6.25 mg Oral BID WC Benjamine Mola, FNP   6.25 mg at 07/20/14 0809  . ciprofloxacin (  CIPRO) tablet 500 mg  500 mg Oral BID Benjamine Mola, FNP   500 mg at 07/20/14 4076  . clonazePAM (KLONOPIN) tablet 0.5 mg  0.5 mg Oral BID PRN Ursula Alert, MD   0.5 mg at 07/20/14 1107  . DULoxetine (CYMBALTA) DR capsule 60 mg  60 mg Oral Daily Benjamine Mola, FNP   60 mg at 07/20/14 8088  . estradiol (ESTRACE) vaginal cream 1 Applicatorful  1 Applicatorful Vaginal Daily Benjamine Mola, FNP   1 Applicatorful at 07/05/14 0813  . gabapentin (NEURONTIN) capsule 100 mg  100 mg Oral QHS Benjamine Mola, FNP   100 mg at 07/19/14 2121  . glimepiride (AMARYL) tablet 2 mg  2 mg Oral Q breakfast Benjamine Mola, FNP   2 mg at 07/20/14 0810  . glycopyrrolate (ROBINUL) tablet 2 mg  2 mg Oral TID PRN Benjamine Mola, FNP      . haloperidol (HALDOL) tablet 10 mg  10 mg Oral Q supper Ursula Alert, MD   10 mg at 07/19/14 1701  . [START ON 07/21/2014] haloperidol (HALDOL) tablet 10 mg  10 mg Oral Daily Kishawn Pickar, MD      . hydrOXYzine (ATARAX/VISTARIL) tablet 25 mg  25 mg Oral TID PRN Ursula Alert, MD      . lamoTRIgine (LAMICTAL) tablet 25 mg  25 mg Oral BID Preslee Regas, MD      . lidocaine (LIDODERM) 5 % 3 patch  3 patch Transdermal Q24H Benjamine Mola, FNP   3 patch at 07/20/14 [phone]  . liver oil-zinc oxide (DESITIN) 40 % ointment   Topical PRN Ursula Alert, MD   1 application at 59/29/24 1701  . loperamide (IMODIUM) capsule 2 mg  2 mg Oral PRN Ursula Alert, MD   2 mg at 07/18/14 1250  . magnesium hydroxide (MILK OF MAGNESIA) suspension 30 mL  30 mL Oral Daily PRN Benjamine Mola, FNP      . methocarbamol (ROBAXIN) tablet 500 mg  500 mg Oral Q8H PRN Benjamine Mola, FNP   500 mg at 07/17/14 2001  . neomycin-bacitracin-polymyxin (NEOSPORIN) ointment   Topical BID Ursula Alert, MD   Stopped at 07/20/14 0800  . nicotine (NICODERM CQ - dosed in mg/24 hours) patch 14 mg  14 mg Transdermal Daily Edwing Figley, MD   14 mg at 07/20/14 0800  . OLANZapine zydis (ZYPREXA) disintegrating tablet 5 mg  5 mg Oral Q8H PRN Benjamine Mola, FNP      . oxyCODONE-acetaminophen (PERCOCET/ROXICET) 5-325 MG per tablet 1 tablet  1 tablet Oral BID PRN Benjamine Mola, FNP   1 tablet at 07/20/14 1150  . pantoprazole (PROTONIX) EC tablet 40 mg  40 mg Oral Daily Benjamine Mola, FNP   40 mg at 07/20/14 4628  . pravastatin (PRAVACHOL) tablet 80 mg  80 mg Oral Daily Benjamine Mola, FNP   80 mg at 07/20/14 6381    Lab Results:  Results for orders placed or performed during the hospital encounter of 07/17/14 (from the past 48 hour(s))  Hemoglobin A1c     Status: None   Collection Time: 07/19/14  6:50 AM  Result  Value Ref Range   Hgb A1c MFr Bld 5.4 <5.7 %    Comment: (NOTE)  According to the ADA Clinical Practice Recommendations for 2011, when HbA1c is used as a screening test:  >=6.5%   Diagnostic of Diabetes Mellitus           (if abnormal result is confirmed) 5.7-6.4%   Increased risk of developing Diabetes Mellitus References:Diagnosis and Classification of Diabetes Mellitus,Diabetes BXUX,8333,83(ANVBT 1):S62-S69 and Standards of Medical Care in         Diabetes - 2011,Diabetes YOMA,0045,99 (Suppl 1):S11-S61. CORRECTED ON 11/17 AT 1059: PREVIOUSLY REPORTED AS 5.4 Reference range: <5.7    Mean Plasma Glucose 108 <117 mg/dL    Comment: Performed at [facility]  Lipid panel     Status: Abnormal   Collection Time: 07/19/14  6:50 AM  Result Value Ref Range   Cholesterol 137 0 - 200 mg/dL   Triglycerides 144 <150 mg/dL   HDL 37 (L) >39 mg/dL   Total CHOL/HDL Ratio 3.7 RATIO   VLDL 29 0 - 40 mg/dL   LDL Cholesterol 71 0 - 99 mg/dL    Comment:        Total Cholesterol/HDL:CHD Risk Coronary Heart Disease Risk Table                     Men   Women  1/2 Average Risk   3.4   3.3  Average Risk       5.0   4.4  2 X Average Risk   9.6   7.1  3 X Average Risk  23.4   11.0        Use the calculated Patient Ratio above and the CHD Risk Table to determine the patient's CHD Risk.        ATP III CLASSIFICATION (LDL):  <100     mg/dL   Optimal  100-129  mg/dL   Near or Above                    Optimal  130-159  mg/dL   Borderline  160-189  mg/dL   High  >190     mg/dL   Very High Performed at [facility]   TSH     Status: None   Collection Time: 07/19/14  6:50 AM  Result Value Ref Range   TSH 0.986 0.350 - 4.500 uIU/mL    Comment: Performed at [facility]    Physical Findings: AIMS: Facial and Oral Movements Muscles of Facial Expression: None, normal Lips and Perioral Area: None, normal Jaw:  None, normal Tongue: None, normal,Extremity Movements Upper (arms, wrists, hands, fingers): None, normal Lower (legs, knees, ankles, toes): None, normal, Trunk Movements Neck, shoulders, hips: None, normal, Overall Severity Severity of abnormal movements (highest score from questions above): None, normal Incapacitation due to abnormal movements: None, normal Patient's awareness of abnormal movements (rate only patient's report): No Awareness, Dental Status Current problems with teeth and/or dentures?: No Does patient usually wear dentures?: No  CIWA:    COWS:     Treatment Plan Summary: Daily contact with patient to assess and evaluate symptoms and progress in treatment Medication management Assessment: Pt is a 68 year old CF ,who presented with delusions,paranoia and hallucinations. Pt has a hx of bipolar disorder as well as several medical problems and is on polypharmacy had recent admission to medical floor for AMS..  Plan: Will increase Haldol to 20 mg po daily with Cogentin 0.5 mg po bid for eps.AIMS - 0 (07/20/14) Will increase Lamictal to  25 mg  Po bid .  Pt reports good effect from Lamictal. Continue Cymbalta for affective sx and pain. Continue Gabapentin 100 mg po qhs . Will reduce Klonopin to 0.5 mg po bid prn for anxiety sx. Will add Vistaril prn for anxiety since we are reducing Klonopin. Will restart home medications where needed.  Will continue to monitor vitals ,medication compliance and treatment side effects while patient is here.  Will monitor for medical issues as well as call consult as needed.  Reviewed labs. CSW will start working on disposition. Will place Home health consult. Pt will need some supervision with administration of medications once discharged. Pt had a recent admission to the medical floor for AMS 2/2 polypharmacy and not taking them properly. Patient to participate in therapeutic milieu .    Medical Decision Making Problem Points:   Established problem, stable/improving (1), Review of last therapy session (1) and Review of psycho-social stressors (1) Data Points:  Order Aims Assessment (2) Review or order clinical lab tests (1) Review and summation of old records (2) Review of medication regiment & side effects (2) Review of new medications or change in dosage (2)  I certify that inpatient services furnished can reasonably be expected to improve the patient's condition.   Shaley Leavens MD 07/20/2014, 1:16 PM  Pt later on approached writer and reported drooling from Haldol as well as concerns about her klonopin being reduced. Discussed with pt the risk of being on BZD including cognitive problems ,confusion as well as other risk factors, Pt agrees to take Klonopin bid prn and take vistaril in between. Also will reduce her Haldol pm dose to 5 mg and increase Cogentin to 1 mg po bid.  Ursula Alert ,MD

## 2014-07-20 NOTE — Progress Notes (Signed)
Patient ID: [name], female   DOB: 10-05-1945, 68 y.o.   MRN: [id number]  D: Pt currently presents with a masked affect and anxious behavior. Per self inventory, pt rates depression at a 1 and anxiety 3. Pt's daily goal is to "Understanding I am limited, the meds may help and knowing they are aware of new meds" and they intend to do so by "talking with [name]) RN and my Dr." Pt reports fair sleep, good concentration and a good appetite. Pt becomes tearful when she is notified that her medicines are being changed. "I'm not here for substance abuse, there is a reason why my doctor prescribes me these medicines." Pt reports pain, rash and "sadness from new meds yesterday." Pt also states the her pain is relieved by "meds taken." Pt also reports that she wants to "talk with a social worker" and the she wants "help washing some clothes."   A: Pt provided with scheduled and PRN medications per providers orders. Pt's labs and vitals were monitored throughout the day. Pt supported emotionally and encouraged to express concerns and questions. Pt consulted with Catering manager. Pt educated on bowel and urinary incontinence and maintenance. Pt educated on medication effects and administration. Pt also encouraged to try non-pharmacological pain treatments.    R: Pt's safety ensured with 15 minute and environmental checks. Pt currently denies SI/HI and A/V hallucinations. Pt verbally agrees to seek staff if SI/HI or A/VH occurs and to consult with staff before acting on these thoughts. Pt's clothes were washed. Pt understands and can verbalize why her treatment plan has been changed.    Teresa Rota, RN

## 2014-07-20 NOTE — [facility] Group Notes (Signed)
[facility] LCSW Group Therapy  07/20/2014 1:18 PM  Type of Therapy:  Group Therapy  Participation Level:  Active  Participation Quality:  Attentive  Affect:  Appropriate  Cognitive:  Alert  Insight:  Improving  Engagement in Therapy:  Engaged  Modes of Intervention:  Discussion, Education, Exploration, Rapport Building, Socialization and Support  Summary of Progress/Problems: [facility] Speaker came to talk about his personal journey with substance abuse and mental illness. The pt processed ways by which to relate to the speaker. [place] speaker provided handouts and educational information pertaining to groups and services offered by the [facility].     Smart, Remmy Riffe LCSWA 07/20/2014, 1:18 PM

## 2014-07-20 NOTE — Plan of Care (Signed)
Problem: Ineffective individual coping Goal: STG-Increase in ability to manage activities of daily living Outcome: Not Progressing Pt expresses wishes to be more involved in her treatment. Pt is still unable to manage incontinence independently.

## 2014-07-20 NOTE — Plan of Care (Signed)
Problem: Alteration in mood Goal: STG-Patient is able to attend at least 2 groups per day Outcome: Completed/Met Date Met:  07/20/14

## 2014-07-20 NOTE — Plan of Care (Signed)
Problem: Alteration in mood Goal: LTG-Patient's behavior demonstrates decreased manic symptoms Pt presenting with depressed mood/calm affect. She reports some racing thoughts. Speech is no pressured. Goal progressing. [place], LCSWA 07/18/2014 1:30 PM  Outcome: Progressing Pt able to sit and watch a movie in the day room. Pt is in a calm mood.

## 2014-07-20 NOTE — Plan of Care (Signed)
Problem: Alteration in thought process Goal: LTG-Patient is able to perceive the environment accurately Outcome: Progressing

## 2014-07-20 NOTE — [facility] Group Notes (Signed)
[facility] LCSW Aftercare Discharge Planning Group Note   07/20/2014 10:12 AM  Participation Quality:  Appropriate   Mood/Affect:  Appropriate  Depression Rating:  1  Anxiety Rating:  4 "anxious to go back home"   Thoughts of Suicide:  No Will you contract for safety?   NA  Current AVH:  No  Plan for Discharge/Comments:  Pt plans to return home until she can find another apartment to live in. Pt plans to follow up with her PCP for meds and is open to therapy referal at [facility] in [place]. Pt requesting info to low income housing in [place], stating that she cannot go on the Internet because her husband is psychotic and might find her. Pt still exhibiting some delusional thinking and paranoid ideations.   Transportation Means: likely [employer] transport   Supports: daughter; no other supports identified   Proofreader, Research officer, trade union

## 2014-07-21 ENCOUNTER — Other Ambulatory Visit: Payer: Self-pay | Admitting: Family Medicine

## 2014-07-21 MED ORDER — PRAVASTATIN SODIUM 80 MG PO TABS: 80.0000 mg | ORAL_TABLET | Freq: Every day | ORAL | Status: DC

## 2014-07-21 MED ORDER — LIDOCAINE 5 % EX PTCH
1.0000 | MEDICATED_PATCH | Freq: Once | CUTANEOUS | Status: AC
  Administered 2014-07-21: 1 via TRANSDERMAL
  Filled 2014-07-21: qty 1

## 2014-07-21 NOTE — Progress Notes (Signed)
Patient ID: [name], female   DOB: 1946-08-28, 68 y.o.   MRN: [id number] PER STATE REGULATIONS 482.30  THIS CHART WAS REVIEWED FOR MEDICAL NECESSITY WITH RESPECT TO THE PATIENT'S ADMISSION/ DURATION OF STAY.  NEXT REVIEW DATE: 07/25/2014  Chauncy Lean, RN, BSN CASE MANAGER

## 2014-07-21 NOTE — Progress Notes (Signed)
Patient ID: [name], female   DOB: 10/14/1945, 68 y.o.   MRN: [id number]  D: Pt currently presents with a masked affect and anxious behavior. Per self inventory, pt rates depression at a 2, hopelessness 0 and anxiety 3. Pt's daily goal is to "learning more of my meds and hoping to maintain my life but need care (home) help." Pt also reports "Thank you, each one, and my Dr. Dennis Bast have been so kind and helpful." Pt reports  Good sleep, concentration and appetite. Pt reports low energy and "back to leg" pain at a 5.   A: Pt provided with medications per providers orders. Pt's labs and vitals were monitored throughout the day. Pt supported emotionally and encouraged to express concerns and questions. Pt consulted with provider, Catering manager. Pt educated on medications and anxiety.   R:Pt's safety ensured with 15 minute and environmental checks. Pt currently denies SI/HI and A/V hallucinations. Pt verbally agrees to seek staff if SI/HI or A/VH occurs and to consult with staff before acting on these thoughts. Two of pt's lidocaine patches fell off her back and they were unable to be reapplied. Provider consulted, one time Lidocaine patch ordered. Pt will work on finding housing for discharge.    Elenore Rota, RN

## 2014-07-21 NOTE — Progress Notes (Signed)
[facility] MD Progress Note  07/21/2014 1:56 PM [name]  MRN:  [id number] Subjective: Pt states,' I feel OK' Objective: Patient seen and chart reviewed.Pt appears to be improving. Pt reports her mood as improving on the current medications . Pt continues to have some delusions as well as reports 'AH" of people talking about her. However pt feels they are patients talking about her on the unit ,unable to know if this is true. Pt continues to have multiple somatic symptoms ,urinary incontinence,uses diaper pads for the same. Pt has been participating in groups. Pt denies any SI/HI/VH. Pt denies any side effects of medications.   Diagnosis:    DSM5: Primary Psychiatric Diagnosis: Bipolar disorder,type I,mixed severe,with psychotic features   Secondary Psychiatric Diagnosis: R/O PTSD   Non Psychiatric Diagnosis: SEE PMH     Total Time spent with patient: 30 minutes    ADL's:  Intact  Sleep: Fair  Appetite:  Fair   Psychiatric Specialty Exam: Physical Exam  ROS  Blood pressure 134/103, pulse 102, temperature 98.3 F (36.8 C), temperature source Oral, resp. rate 16, height 5\' 4"  (1.626 m), weight 70.444 kg (155 lb 4.8 oz), SpO2 99 %.Body mass index is 26.64 kg/(m^2).  General Appearance: Fairly Groomed  Engineer, water::  Fair  Speech:  Clear and Coherent  Volume:  Normal  Mood:  Anxious  Affect:  Appropriate  Thought Process:  Disorganized, Irrelevant and Loose improving  Orientation:  Full (Time, Place, and Person)  Thought Content:  Delusions, Hallucinations: Auditory, Paranoid Ideation and Ruminationimproving  Suicidal Thoughts:  No  Homicidal Thoughts:  No  Memory:  Immediate;   Fair Recent;   Fair Remote;   Fair  Judgement:  Fair  Insight:  Fair  Psychomotor Activity:  Normal  Concentration:  Fair  Recall:  [employer] of Knowledge:Fair  Language: Fair  Akathisia:  No  Handed:  Right  AIMS (if indicated):     Assets:  Communication Skills Desire  for Improvement  Sleep:  Number of Hours: 6.75   Musculoskeletal: Strength & Muscle Tone: within normal limits Gait & Station: normal Patient leans: N/A  Current Medications: Current Facility-Administered Medications  Medication Dose Route Frequency Provider Last Rate Last Dose  . acetaminophen (TYLENOL) tablet 650 mg  650 mg Oral Q6H PRN Benjamine Mola, FNP   650 mg at 07/20/14 1609  . albuterol (PROVENTIL) (2.5 MG/3ML) 0.083% nebulizer solution 2.5 mg  2.5 mg Nebulization Q6H PRN Benjamine Mola, FNP      . albuterol-ipratropium (COMBIVENT) inhaler 2 puff  2 puff Inhalation Q6H PRN Benjamine Mola, FNP      . alum & mag hydroxide-simeth (MAALOX/MYLANTA) 200-200-20 MG/5ML suspension 30 mL  30 mL Oral Q4H PRN Benjamine Mola, FNP      . amLODipine (NORVASC) tablet 10 mg  10 mg Oral Daily Benjamine Mola, FNP   10 mg at 07/21/14 0754  . aspirin EC tablet 81 mg  81 mg Oral Daily Benjamine Mola, FNP   81 mg at 07/21/14 0757  . benztropine (COGENTIN) tablet 1 mg  1 mg Oral BID Ursula Alert, MD   1 mg at 07/21/14 0800  . carvedilol (COREG) tablet 6.25 mg  6.25 mg Oral BID WC Benjamine Mola, FNP   6.25 mg at 07/21/14 0755  . clonazePAM (KLONOPIN) tablet 0.5 mg  0.5 mg Oral BID PRN Ursula Alert, MD   0.5 mg at 07/21/14 1320  . DULoxetine (CYMBALTA) DR capsule 60 mg  60 mg Oral Daily Benjamine Mola, FNP   60 mg at 07/21/14 0755  . estradiol (ESTRACE) vaginal cream 1 Applicatorful  1 Applicatorful Vaginal Daily Benjamine Mola, FNP   1 Applicatorful at 17/51/02 [phone]  . gabapentin (NEURONTIN) capsule 100 mg  100 mg Oral QHS Benjamine Mola, FNP   100 mg at 07/20/14 2131  . glimepiride (AMARYL) tablet 2 mg  2 mg Oral Q breakfast Benjamine Mola, FNP   2 mg at 07/21/14 7782  . glycopyrrolate (ROBINUL) tablet 2 mg  2 mg Oral TID PRN Benjamine Mola, FNP      . haloperidol (HALDOL) tablet 10 mg  10 mg Oral Daily Candis Kabel, MD   10 mg at 07/21/14 0755  . haloperidol (HALDOL) tablet 5 mg  5 mg Oral Q  supper Ursula Alert, MD   5 mg at 07/20/14 1713  . hydrOXYzine (ATARAX/VISTARIL) tablet 25 mg  25 mg Oral TID PRN Ursula Alert, MD   25 mg at 07/20/14 1610  . lamoTRIgine (LAMICTAL) tablet 25 mg  25 mg Oral BID Ursula Alert, MD   25 mg at 07/21/14 0752  . lidocaine (LIDODERM) 5 % 1 patch  1 patch Transdermal Once Kamaljit Hizer, MD      . lidocaine (LIDODERM) 5 % 3 patch  3 patch Transdermal Q24H Benjamine Mola, FNP   3 patch at 07/21/14 [phone]  . liver oil-zinc oxide (DESITIN) 40 % ointment   Topical PRN Ursula Alert, MD   1 application at 36/14/43 1701  . loperamide (IMODIUM) capsule 2 mg  2 mg Oral PRN Ursula Alert, MD   2 mg at 07/18/14 1250  . magnesium hydroxide (MILK OF MAGNESIA) suspension 30 mL  30 mL Oral Daily PRN Benjamine Mola, FNP      . methocarbamol (ROBAXIN) tablet 500 mg  500 mg Oral Q8H PRN Benjamine Mola, FNP   500 mg at 07/17/14 2001  . neomycin-bacitracin-polymyxin (NEOSPORIN) ointment   Topical BID Ursula Alert, MD   Stopped at 07/21/14 0800  . nicotine (NICODERM CQ - dosed in mg/24 hours) patch 14 mg  14 mg Transdermal Daily Loghan Kurtzman, MD   14 mg at 07/21/14 0800  . OLANZapine zydis (ZYPREXA) disintegrating tablet 5 mg  5 mg Oral Q8H PRN Benjamine Mola, FNP      . oxyCODONE-acetaminophen (PERCOCET/ROXICET) 5-325 MG per tablet 1 tablet  1 tablet Oral BID PRN Benjamine Mola, FNP   1 tablet at 07/21/14 1320  . pantoprazole (PROTONIX) EC tablet 40 mg  40 mg Oral Daily Benjamine Mola, FNP   40 mg at 07/21/14 0800  . pravastatin (PRAVACHOL) tablet 80 mg  80 mg Oral Daily Benjamine Mola, FNP   80 mg at 07/21/14 1540    Lab Results:  No results found for this or any previous visit (from the past 48 hour(s)).  Physical Findings: AIMS: Facial and Oral Movements Muscles of Facial Expression: None, normal Lips and Perioral Area: None, normal Jaw: None, normal Tongue: None, normal,Extremity Movements Upper (arms, wrists, hands, fingers): None, normal Lower (legs,  knees, ankles, toes): None, normal, Trunk Movements Neck, shoulders, hips: None, normal, Overall Severity Severity of abnormal movements (highest score from questions above): None, normal Incapacitation due to abnormal movements: None, normal Patient's awareness of abnormal movements (rate only patient's report): No Awareness, Dental Status Current problems with teeth and/or dentures?: No Does patient usually wear dentures?: No  CIWA:    COWS:  Treatment Plan Summary: Daily contact with patient to assess and evaluate symptoms and progress in treatment Medication management Assessment: Pt is a 68 year old CF ,who presented with delusions,paranoia and hallucinations. Pt has a hx of bipolar disorder as well as several medical problems and is on polypharmacy had recent admission to medical floor for AMS..  Plan: Will continue  Haldol 15 mg po daily with Cogentin 0.5 mg po bid for eps.AIMS - 0 (07/20/14) Will continue Lamictal to  25 mg  Po bid . Pt reports good effect from Lamictal. Continue Cymbalta for affective sx and pain. Continue Gabapentin 100 mg po qhs . Will continue  Klonopin 0.5 mg po bid prn for anxiety sx. Will continue Vistaril prn for anxiety since we are reducing Klonopin. Will restart home medications where needed.  Will continue to monitor vitals ,medication compliance and treatment side effects while patient is here.  Will monitor for medical issues as well as call consult as needed.  Reviewed labs. CSW will start working on disposition.Home health consult. Pt will need some supervision with administration of medications once discharged. Pt had a recent admission to the medical floor for AMS 2/2 polypharmacy and not taking them properly. Patient to participate in therapeutic milieu .    Medical Decision Making Problem Points:  Established problem, stable/improving (1), Review of last therapy session (1) and Review of psycho-social stressors (1) Data Points:   Order Aims Assessment (2) Review or order clinical lab tests (1) Review and summation of old records (2) Review of medication regiment & side effects (2) Review of new medications or change in dosage (2)  I certify that inpatient services furnished can reasonably be expected to improve the patient's condition.   Teresa Burlison MD 07/21/2014, 1:56 PM

## 2014-07-21 NOTE — Plan of Care (Signed)
Problem: Alteration in thought process Goal: LTG-Patient behavior demonstrates decreased signs psychosis Pt reports no AVH, SI, or HI. Pt demonstrates paranoid ideations/delusional thinking at this time. Pt oriented to person/time/place. Pt open to medication management. Goal progressing. [place], LCSWA 07/18/2014 1:29 PM  Outcome: Progressing Pt reports no A/VH.

## 2014-07-21 NOTE — BHH Group Notes (Signed)
[facility] LCSW Group Therapy  07/21/2014 1:44 PM   Type of Therapy:  Group Therapy  Participation Level:  Active  Participation Quality:  Attentive  Affect:  Appropriate  Cognitive:  Appropriate  Insight:  Improving  Engagement in Therapy:  Engaged  Modes of Intervention:  Clarification, Education, Exploration and Socialization  Summary of Progress/Problems: Today's group focused on relapse prevention.  We defined the term, and then brainstormed on ways to prevent relapse.[name] walked in and out of group several times.  No meaningful input to the discussion.  [name] 07/21/2014 , 1:44 PM
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
## 2014-07-21 NOTE — Plan of Care (Signed)
Problem: Alteration in mood Goal: LTG-Patient/family will demonstrate knowledge of aftercare (Patient and/or family/significant other will demonstrate knowledge of aftercare and the importance of complicance)  Outcome: Progressing Pt reports in her self inventory that she will need home care once she is discharged. She is accepting of this.

## 2014-07-21 NOTE — BHH Group Notes (Signed)
[facility] Group Notes:  (Nursing/MHT/Case Management/Adjunct)  Date:  07/21/2014  Time:  4:12 PM  Type of Therapy:  Nurse Education  Participation Level:  Active  Participation Quality:  Appropriate and Supportive  Affect:  Anxious and Flat  Cognitive:  Alert, Appropriate and Oriented  Insight:  Appropriate  Engagement in Group:  Engaged  Modes of Intervention:  Activity, Confrontation, Discussion and Education, Orientation  Summary of Progress/Problems: Pt to identify three daily goals.  Elenore Rota 07/21/2014, 4:12 PM

## 2014-07-21 NOTE — Progress Notes (Signed)
Chaplain provided continued support with pt in dayroom.  Provided empathic presence, engaged in life review, and worked with pt to identify goals and motivators for discharge.   [place], [place]

## 2014-07-21 NOTE — Plan of Care (Signed)
Problem: Alteration in thought process Goal: STG-Patient is able to discuss thoughts with staff Outcome: Progressing Pt is able to come to staff with concerns and ideation behind her emotions and behaviors.

## 2014-07-21 NOTE — Telephone Encounter (Signed)
Pharmacy called requesting 90 day RX per patient request.  RX for pravastatin sent to pharmacy # [id number] x 1 rf.

## 2014-07-22 MED ORDER — CLONAZEPAM 0.5 MG PO TABS
0.5000 mg | ORAL_TABLET | Freq: Three times a day (TID) | ORAL | Status: DC | PRN
  Administered 2014-07-22 – 2014-07-25 (×10): 0.5 mg via ORAL
  Filled 2014-07-22 (×9): qty 1

## 2014-07-22 MED ORDER — HALOPERIDOL 5 MG PO TABS
5.0000 mg | ORAL_TABLET | Freq: Every day | ORAL | Status: DC
  Administered 2014-07-23 – 2014-07-25 (×3): 5 mg via ORAL
  Filled 2014-07-22 (×5): qty 1

## 2014-07-22 MED ORDER — CLONAZEPAM 0.5 MG PO TABS
ORAL_TABLET | ORAL | Status: AC
  Filled 2014-07-22: qty 1

## 2014-07-22 NOTE — Progress Notes (Signed)
Patient ID: [name], female   DOB: [birth year], 68 y.o.   MRN: [id number] D. Pt presents with depressed mood, affect anxious. [name] reports that she feels she is improving, and states '' I don't want to talk about all what brought me in here. But I have put those things in the past. '' She states she is sleeping and eating well, and denies any SI/HI/A/V Hallucinations. Pt states '' i don't understand why the doctor is changing my pain medications, those medications were working for me. She does report that her anxiety is a 4/5 out of 10 on scale. She has been interactive on the unit, attending programming and visible in the milieu. A. Medications given as ordered including prn medications for anxiety, pain. Pt verbalized relief. R. Patient is safe. In no acute distress at this time. In no acute distress at this time. Will continue to monitor q 15 minutes for safety.

## 2014-07-22 NOTE — BHH Group Notes (Signed)
[place] LCSW Group Therapy  07/22/2014  1:05 PM  Type of Therapy:  Group therapy  Participation Level:  Active  Participation Quality:  Attentive  Affect:  Flat  Cognitive:  Oriented  Insight:  Limited  Engagement in Therapy:  Limited  Modes of Intervention:  Discussion, Socialization  Summary of Progress/Problems:  Chaplain was here to lead a group on themes of hope and courage. Sat quietly for the most part.   Shared at the end when given the opportunity to choose a picture about motivation.  Trish Mage 07/22/2014 12:36 PM
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
## 2014-07-22 NOTE — Progress Notes (Signed)
[facility] MD Progress Note  07/22/2014 1:42 PM [name]  MRN:  [id number] Subjective: Pt states,' I feel OK' Objective: Patient seen and chart reviewed.Pt appears to be improving. Pt reports that she is unable to manage her anxiety throughout the day without her 'klonopin". Patients Klonopin was reduced to BID as well as she was started on Vistaril prn for breakthrough anxiety. Pt today also reports some tremors around her lips . Discussed with pt this could be a side effect of her Haldol and that we could try reducing her dose further. Pt did not talk about any of her delusions today and seems to be improving with regards to that. Pt to go home with home health and is happy about it. Pt has been participating in groups. Pt denies any SI/HI/VH.   Diagnosis:    DSM5: Primary Psychiatric Diagnosis: Bipolar disorder,type I,mixed severe,with psychotic features      Non Psychiatric Diagnosis: SEE PMH     Total Time spent with patient: 30 minutes    ADL's:  Intact  Sleep: Fair  Appetite:  Fair   Psychiatric Specialty Exam: Physical Exam  ROS  Blood pressure 123/70, pulse 120, temperature 97.7 F (36.5 C), temperature source Oral, resp. rate 20, height 5\' 4"  (1.626 m), weight 70.444 kg (155 lb 4.8 oz), SpO2 99 %.Body mass index is 26.64 kg/(m^2).  General Appearance: Fairly Groomed  Engineer, water::  Fair  Speech:  Clear and Coherent  Volume:  Normal  Mood:  Anxious  Affect:  Appropriate  Thought Process:  Disorganized, Irrelevant and Loose improving  Orientation:  Full (Time, Place, and Person)  Thought Content:  Delusions, Hallucinations: Auditory, Paranoid Ideation and Ruminationimproving  Suicidal Thoughts:  No  Homicidal Thoughts:  No  Memory:  Immediate;   Fair Recent;   Fair Remote;   Fair  Judgement:  Fair  Insight:  Fair  Psychomotor Activity:  Normal  Concentration:  Fair  Recall:  [employer] of Knowledge:Fair  Language: Fair  Akathisia:  No   Handed:  Right  AIMS (if indicated):     Assets:  Communication Skills Desire for Improvement  Sleep:  Number of Hours: 6.75   Musculoskeletal: Strength & Muscle Tone: within normal limits Gait & Station: normal Patient leans: N/A  Current Medications: Current Facility-Administered Medications  Medication Dose Route Frequency Provider Last Rate Last Dose  . acetaminophen (TYLENOL) tablet 650 mg  650 mg Oral Q6H PRN Benjamine Mola, FNP   650 mg at 07/21/14 2303  . albuterol (PROVENTIL) (2.5 MG/3ML) 0.083% nebulizer solution 2.5 mg  2.5 mg Nebulization Q6H PRN Benjamine Mola, FNP      . albuterol-ipratropium (COMBIVENT) inhaler 2 puff  2 puff Inhalation Q6H PRN Benjamine Mola, FNP      . alum & mag hydroxide-simeth (MAALOX/MYLANTA) 200-200-20 MG/5ML suspension 30 mL  30 mL Oral Q4H PRN Benjamine Mola, FNP      . amLODipine (NORVASC) tablet 10 mg  10 mg Oral Daily Benjamine Mola, FNP   10 mg at 07/22/14 0748  . aspirin EC tablet 81 mg  81 mg Oral Daily Benjamine Mola, FNP   81 mg at 07/22/14 8338  . benztropine (COGENTIN) tablet 1 mg  1 mg Oral BID Ursula Alert, MD   1 mg at 07/22/14 0748  . carvedilol (COREG) tablet 6.25 mg  6.25 mg Oral BID WC Benjamine Mola, FNP   6.25 mg at 07/22/14 0748  . clonazePAM (KLONOPIN) tablet 0.5  mg  0.5 mg Oral TID PRN Ursula Alert, MD   0.5 mg at 07/22/14 1158  . DULoxetine (CYMBALTA) DR capsule 60 mg  60 mg Oral Daily Benjamine Mola, FNP   60 mg at 07/22/14 0748  . estradiol (ESTRACE) vaginal cream 1 Applicatorful  1 Applicatorful Vaginal Daily Benjamine Mola, FNP   1 Applicatorful at 78/46/96 [phone]  . gabapentin (NEURONTIN) capsule 100 mg  100 mg Oral QHS Benjamine Mola, FNP   100 mg at 07/21/14 2128  . glimepiride (AMARYL) tablet 2 mg  2 mg Oral Q breakfast Benjamine Mola, FNP   2 mg at 07/22/14 0748  . glycopyrrolate (ROBINUL) tablet 2 mg  2 mg Oral TID PRN Benjamine Mola, FNP      . haloperidol (HALDOL) tablet 5 mg  5 mg Oral Q supper Ursula Alert, MD   5 mg at 07/21/14 1711  . [START ON 07/23/2014] haloperidol (HALDOL) tablet 5 mg  5 mg Oral Daily Makaria Poarch, MD      . lamoTRIgine (LAMICTAL) tablet 25 mg  25 mg Oral BID Ursula Alert, MD   25 mg at 07/22/14 0748  . lidocaine (LIDODERM) 5 % 3 patch  3 patch Transdermal Q24H Benjamine Mola, FNP   3 patch at 07/22/14 [phone]  . liver oil-zinc oxide (DESITIN) 40 % ointment   Topical PRN Ursula Alert, MD   1 application at 24/40/10 1701  . loperamide (IMODIUM) capsule 2 mg  2 mg Oral PRN Ursula Alert, MD   2 mg at 07/18/14 1250  . magnesium hydroxide (MILK OF MAGNESIA) suspension 30 mL  30 mL Oral Daily PRN Benjamine Mola, FNP      . methocarbamol (ROBAXIN) tablet 500 mg  500 mg Oral Q8H PRN Benjamine Mola, FNP   500 mg at 07/22/14 1127  . neomycin-bacitracin-polymyxin (NEOSPORIN) ointment   Topical BID Ursula Alert, MD   15 application at 27/25/36 1403  . nicotine (NICODERM CQ - dosed in mg/24 hours) patch 14 mg  14 mg Transdermal Daily Ursula Alert, MD   14 mg at 07/22/14 0754  . OLANZapine zydis (ZYPREXA) disintegrating tablet 5 mg  5 mg Oral Q8H PRN Benjamine Mola, FNP      . oxyCODONE-acetaminophen (PERCOCET/ROXICET) 5-325 MG per tablet 1 tablet  1 tablet Oral BID PRN Benjamine Mola, FNP   1 tablet at 07/22/14 6440  . pantoprazole (PROTONIX) EC tablet 40 mg  40 mg Oral Daily Benjamine Mola, FNP   40 mg at 07/22/14 0748  . pravastatin (PRAVACHOL) tablet 80 mg  80 mg Oral Daily Benjamine Mola, FNP   80 mg at 07/22/14 3474    Lab Results:  No results found for this or any previous visit (from the past 48 hour(s)).  Physical Findings: AIMS: Facial and Oral Movements Muscles of Facial Expression: None, normal Lips and Perioral Area: None, normal Jaw: None, normal Tongue: None, normal,Extremity Movements Upper (arms, wrists, hands, fingers): None, normal Lower (legs, knees, ankles, toes): None, normal, Trunk Movements Neck, shoulders, hips: None, normal, Overall  Severity Severity of abnormal movements (highest score from questions above): None, normal Incapacitation due to abnormal movements: None, normal Patient's awareness of abnormal movements (rate only patient's report): No Awareness, Dental Status Current problems with teeth and/or dentures?: No Does patient usually wear dentures?: No  CIWA:    COWS:     Treatment Plan Summary: Daily contact with patient to assess and evaluate symptoms and  progress in treatment Medication management Assessment: Pt is a 68 year old CF ,who presented with delusions,paranoia and hallucinations. Pt has a hx of bipolar disorder as well as several medical problems and is on polypharmacy had recent admission to medical floor for AMS..  Plan: Will reduce Haldol to 10 mg po daily with Cogentin 0.5 mg po bid for eps.AIMS - 1 (07/22/14)-has tremors around her lips. Will continue Lamictal to  25 mg  Po bid . Pt reports good effect from Lamictal. Continue Cymbalta for affective sx and pain. Continue Gabapentin 100 mg po qhs . Will restart   Klonopin 0.5 mg po tid prn for anxiety sx. Will discontinue Vistaril prn for anxiety . Will restart home medications where needed.  Will continue to monitor vitals ,medication compliance and treatment side effects while patient is here.  Will monitor for medical issues as well as call consult as needed.  Reviewed labs. CSW will start working on disposition.Home health consult. Pt will need some supervision with administration of medications once discharged. Pt had a recent admission to the medical floor for AMS 2/2 polypharmacy and not taking them properly. Patient to participate in therapeutic milieu .    Medical Decision Making Problem Points:  Established problem, stable/improving (1), Review of last therapy session (1) and Review of psycho-social stressors (1) Data Points:  Order Aims Assessment (2) Review or order clinical lab tests (1) Review and summation of old  records (2) Review of medication regiment & side effects (2) Review of new medications or change in dosage (2)  I certify that inpatient services furnished can reasonably be expected to improve the patient's condition.   Keisuke Hollabaugh MD 07/22/2014, 1:42 PM

## 2014-07-23 NOTE — Progress Notes (Signed)
Patient ID: [name], female   DOB: [birth year], 68 y.o.   MRN: [id number] [name] [facility] MD Progress Note  07/23/2014 3:50 PM [name]  MRN:  [id number] Subjective:  Patient states "My anxiety is building up soon. I feel depressed because of my chronic pain. I am sleeping and eating better. I've got to get back home. I have bills to pay. I am voluntary and should leave. I have gotten the treatment that I need."   Objective: Patient seen and chart reviewed.  Pt denies any SI/HI/VH. She is focused today about leaving the hospital and is adamant about this. Spoke to Dr. Shea Evans who reports that the patient is scheduled to be discharged on Monday. The patient is active on the unit and attending the scheduled groups. Does not openly discuss delusional thought content but alludes to "being stolen from was real." Patient is compliant with her medications and denies any adverse effects. She continues to request Klonopin on a prn basis for anxiety. Patient will have home health after discharge to assist with medications as patient recently had AMS due to polypharmacy.   Diagnosis:    DSM5: Primary Psychiatric Diagnosis: Bipolar disorder,type I,mixed severe,with psychotic features Non Psychiatric Diagnosis: SEE PMH  Total Time spent with patient: 30 minutes  ADL's:  Intact  Sleep: Fair  Appetite:  Fair  Psychiatric Specialty Exam: Physical Exam  Review of Systems  Constitutional: Negative.   HENT: Negative.   Eyes: Negative.   Respiratory: Negative.   Cardiovascular: Negative.   Gastrointestinal: Negative.   Musculoskeletal: Positive for joint pain (Chronic right knee pain).  Skin: Negative.   Neurological: Negative.   Endo/Heme/Allergies: Negative.   Psychiatric/Behavioral: Positive for depression. The patient is nervous/anxious.     Blood pressure 110/61, pulse 118, temperature 98.3 F (36.8 C), temperature source Oral, resp. rate 16, height 5\' 4"  (1.626 m), weight 70.444 kg  (155 lb 4.8 oz), SpO2 99 %.Body mass index is 26.64 kg/(m^2).  General Appearance: Fairly Groomed  Engineer, water::  Fair  Speech:  Clear and Coherent  Volume:  Normal  Mood:  Anxious  Affect:  Appropriate  Thought Process:  Disorganized, Irrelevant and Loose improving  Orientation:  Full (Time, Place, and Person)  Thought Content:  Delusions, Hallucinations: Auditory, Paranoid Ideation and Ruminationimproving  Suicidal Thoughts:  No  Homicidal Thoughts:  No  Memory:  Immediate;   Fair Recent;   Fair Remote;   Fair  Judgement:  Fair  Insight:  Fair  Psychomotor Activity:  Normal  Concentration:  Fair  Recall:  [employer] of Knowledge:Fair  Language: Fair  Akathisia:  No  Handed:  Right  AIMS (if indicated):     Assets:  Communication Skills Desire for Improvement  Sleep:  Number of Hours: 5.5   Musculoskeletal: Strength & Muscle Tone: within normal limits Gait & Station: normal Patient leans: N/A  Current Medications: Current Facility-Administered Medications  Medication Dose Route Frequency Provider Last Rate Last Dose  . acetaminophen (TYLENOL) tablet 650 mg  650 mg Oral Q6H PRN Benjamine Mola, FNP   650 mg at 07/22/14 2025  . albuterol (PROVENTIL) (2.5 MG/3ML) 0.083% nebulizer solution 2.5 mg  2.5 mg Nebulization Q6H PRN Benjamine Mola, FNP      . albuterol-ipratropium (COMBIVENT) inhaler 2 puff  2 puff Inhalation Q6H PRN Benjamine Mola, FNP      . alum & mag hydroxide-simeth (MAALOX/MYLANTA) 200-200-20 MG/5ML suspension 30 mL  30 mL Oral Q4H PRN Benjamine Mola, FNP      .  amLODipine (NORVASC) tablet 10 mg  10 mg Oral Daily Benjamine Mola, FNP   10 mg at 07/23/14 0753  . aspirin EC tablet 81 mg  81 mg Oral Daily Benjamine Mola, FNP   81 mg at 07/23/14 0753  . benztropine (COGENTIN) tablet 1 mg  1 mg Oral BID Ursula Alert, MD   1 mg at 07/23/14 0754  . carvedilol (COREG) tablet 6.25 mg  6.25 mg Oral BID WC Benjamine Mola, FNP   6.25 mg at 07/23/14 0754  . clonazePAM  (KLONOPIN) tablet 0.5 mg  0.5 mg Oral TID PRN Ursula Alert, MD   0.5 mg at 07/23/14 1202  . DULoxetine (CYMBALTA) DR capsule 60 mg  60 mg Oral Daily Benjamine Mola, FNP   60 mg at 07/23/14 0753  . estradiol (ESTRACE) vaginal cream 1 Applicatorful  1 Applicatorful Vaginal Daily Benjamine Mola, FNP   1 Applicatorful at 97/41/63 [phone]  . gabapentin (NEURONTIN) capsule 100 mg  100 mg Oral QHS Benjamine Mola, FNP   100 mg at 07/21/14 2128  . glimepiride (AMARYL) tablet 2 mg  2 mg Oral Q breakfast Benjamine Mola, FNP   2 mg at 07/23/14 0753  . glycopyrrolate (ROBINUL) tablet 2 mg  2 mg Oral TID PRN Benjamine Mola, FNP      . haloperidol (HALDOL) tablet 5 mg  5 mg Oral Q supper Ursula Alert, MD   5 mg at 07/22/14 1603  . haloperidol (HALDOL) tablet 5 mg  5 mg Oral Daily Ursula Alert, MD   5 mg at 07/23/14 0753  . lamoTRIgine (LAMICTAL) tablet 25 mg  25 mg Oral BID Ursula Alert, MD   25 mg at 07/23/14 0754  . lidocaine (LIDODERM) 5 % 3 patch  3 patch Transdermal Q24H Benjamine Mola, FNP   3 patch at 07/23/14 0757  . liver oil-zinc oxide (DESITIN) 40 % ointment   Topical PRN Ursula Alert, MD   1 application at 64/68/03 2032  . loperamide (IMODIUM) capsule 2 mg  2 mg Oral PRN Ursula Alert, MD   2 mg at 07/18/14 1250  . magnesium hydroxide (MILK OF MAGNESIA) suspension 30 mL  30 mL Oral Daily PRN Benjamine Mola, FNP      . methocarbamol (ROBAXIN) tablet 500 mg  500 mg Oral Q8H PRN Benjamine Mola, FNP   500 mg at 07/22/14 2025  . neomycin-bacitracin-polymyxin (NEOSPORIN) ointment   Topical BID Ursula Alert, MD   15 application at 21/22/48 0757  . nicotine (NICODERM CQ - dosed in mg/24 hours) patch 14 mg  14 mg Transdermal Daily Ursula Alert, MD   14 mg at 07/23/14 0757  . OLANZapine zydis (ZYPREXA) disintegrating tablet 5 mg  5 mg Oral Q8H PRN Benjamine Mola, FNP      . oxyCODONE-acetaminophen (PERCOCET/ROXICET) 5-325 MG per tablet 1 tablet  1 tablet Oral BID PRN Benjamine Mola, FNP   1 tablet at  07/23/14 1202  . pantoprazole (PROTONIX) EC tablet 40 mg  40 mg Oral Daily Benjamine Mola, FNP   40 mg at 07/23/14 0754  . pravastatin (PRAVACHOL) tablet 80 mg  80 mg Oral Daily Benjamine Mola, FNP   80 mg at 07/23/14 2500    Lab Results:  No results found for this or any previous visit (from the past 48 hour(s)).  Physical Findings: AIMS: Facial and Oral Movements Muscles of Facial Expression: None, normal Lips and Perioral Area: None, normal Jaw:  None, normal Tongue: None, normal,Extremity Movements Upper (arms, wrists, hands, fingers): None, normal Lower (legs, knees, ankles, toes): None, normal, Trunk Movements Neck, shoulders, hips: None, normal, Overall Severity Severity of abnormal movements (highest score from questions above): None, normal Incapacitation due to abnormal movements: None, normal Patient's awareness of abnormal movements (rate only patient's report): No Awareness, Dental Status Current problems with teeth and/or dentures?: No Does patient usually wear dentures?: No  CIWA:    COWS:     Treatment Plan Summary: Daily contact with patient to assess and evaluate symptoms and progress in treatment Medication management Assessment:  Plan: 1. Continue Haldol to 10 mg po daily with Cogentin 0.5 mg po bid for eps.AIMS - 1 (07/22/14)-has tremors around her lips. 2.Will continue Lamictal to  25 mg  Po bid for improved mood stability.  3.Continue Cymbalta for affective sx and pain. 4.Continue Gabapentin 100 mg po qhs . 5.Will continue   Klonopin 0.5 mg po tid prn for anxiety sx. 6.Will discontinue Vistaril prn for anxiety . 7.Will restart home medications where needed.  8.Will continue to monitor vitals ,medication compliance and treatment side effects while patient is here.  9.Will monitor for medical issues as well as call consult as needed. Continue percocet one tablet bid prn for chronic knee pain.  10. CSW to arrange home health consult to assist with  administration of medications after discharge.   Medical Decision Making Problem Points:  Established problem, stable/improving (1), Review of last therapy session (1) and Review of psycho-social stressors (1) Data Points:  Order Aims Assessment (2) Review or order clinical lab tests (1) Review of medication regiment & side effects (2) Review of new medications or change in dosage (2)  I certify that inpatient services furnished can reasonably be expected to improve the patient's condition.   Nisaiah Bechtol NP-C 07/23/2014, 3:50 PM
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
## 2014-07-23 NOTE — Progress Notes (Signed)
Adult Psychoeducational Group Note  Date:  07/23/2014 Time:  12:06 AM  Group Topic/Focus:  Wrap-Up Group:   The focus of this group is to help patients review their daily goal of treatment and discuss progress on daily workbooks.  Participation Level:  Active  Participation Quality:  Appropriate  Affect:  Appropriate  Cognitive:  Appropriate  Insight: Appropriate  Engagement in Group:  Engaged  Modes of Intervention:  Discussion  Additional Comments:  Pt was present for wrap up group. She shared that she had a goal today of working toward getting an apartment. She was unable to do this today, but was directed to use her Education officer, museum as a Theatre manager for this. Pt shared that she had a good day today because a friend brought her a gift. She stated that this made her feel special and reminded her of good times. She was polite and pleasant on the unit.  Harrie Foreman A 07/23/2014, 12:06 AM

## 2014-07-23 NOTE — Progress Notes (Signed)
Psychoeducational Group Note  Date:  04/11/2012 Time: 1015  Group Topic/Focus:  Identifying Needs:   The focus of this group is to help patients identify their personal needs that have been historically problematic and identify healthy behaviors to address their needs.  Participation Level: fair   Participation Quality:Good   Affect: Falt  Cognitive: Intact   Insight:  fair  Engagement in Group: ENGAGED  Additional Comments:

## 2014-07-23 NOTE — BHH Group Notes (Signed)
[place] LCSW Group Therapy  07/23/2014 12:55 PM  Type of Therapy:  Group Therapy  Participation Level:  Active  Participation Quality:  Attentive and Sharing  Affect:  Appropriate  Cognitive:  Appropriate and Oriented  Insight:  Limited  Engagement in Therapy:  Engaged  Modes of Intervention:  Discussion  Summary of Progress/Problems: Patient participated in group today during which the discussion was about coping strategies. The group processed examples of positive coping mechanisms. Each group member identified a positive attribute and the other group members supported by sharing ways they could use that attribute as a positive coping skill. Group proceded through discussion and open dialogue.   [name] 07/23/2014, 12:55 PM

## 2014-07-23 NOTE — Progress Notes (Signed)
Did not attend group 
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
## 2014-07-24 NOTE — BHH Group Notes (Signed)
[place] LCSW Group Therapy  07/24/2014 1:02 PM  Type of Therapy:  Group Therapy  Participation Level:  Active  Participation Quality:  Appropriate  Affect:  Appropriate  Cognitive:  Alert and Oriented  Insight:  Developing/Improving  Engagement in Therapy:  Engaged  Modes of Intervention:  Discussion  Summary of Progress/Problems: Patient participated in group during which we discussed the 5 Ws of Support. Who, Why, What, Where and When. This group discussion talked about Who their identified supports are. Why those people are chosen as their supports. What is the difference between Support and Enabling. Where can you reach your supports and When do you need your supports.  Group context provided for empowerment of positive behaviors in the patient.    [name] 07/24/2014, 1:02 PM

## 2014-07-24 NOTE — Progress Notes (Signed)
Writer spoke with patient 1:1 and she reports her day as being good. She is compliant with her medications and feels that she is ready to be discharged home. She has been up briefly in the dayroom and returns to her room to rest. She denies si/hi/a/v hallucinations. Support and encouragement given, safety maintained on unit with 15 min checks.

## 2014-07-24 NOTE — Progress Notes (Signed)
Patient ID: [name], female   DOB: 09-May-1946, 68 y.o.   MRN: [id number] [facility] MD Progress Note  07/24/2014 2:37 PM [name]  MRN:  [id number] Subjective:  Patient states " I am OK today,I did not know why I was anxious yesterday ,I was just thinking about all my bills and my apartment ,but now I know I can just wait for another day.""  Objective: Patient seen and chart reviewed.  Pt denies any SI/HI/VH/AH. She is focused today on getting better and staying on medications. The patient is active on the unit and attending the scheduled groups. Does not openly discuss delusional thought content today. Patient is compliant with her medications and denies any adverse effects. She continues to request Klonopin on a prn basis for anxiety. Patient will have home health after discharge to assist with medications as patient recently had AMS due to polypharmacy.   Diagnosis:    DSM5: Primary Psychiatric Diagnosis: Bipolar disorder,type I,mixed severe,with psychotic features Non Psychiatric Diagnosis: SEE PMH  Total Time spent with patient: 30 minutes  ADL's:  Intact  Sleep: Fair  Appetite:  Fair  Psychiatric Specialty Exam: Physical Exam  Review of Systems  Constitutional: Negative.   HENT: Negative.   Eyes: Negative.   Respiratory: Negative.   Cardiovascular: Negative.   Gastrointestinal: Negative.   Musculoskeletal: Positive for joint pain (Chronic right knee pain).  Skin: Negative.   Neurological: Negative.   Endo/Heme/Allergies: Negative.   Psychiatric/Behavioral: Positive for depression. The patient is nervous/anxious.     Blood pressure 138/66, pulse 86, temperature 98.1 F (36.7 C), temperature source Oral, resp. rate 18, height 5\' 4"  (1.626 m), weight 70.444 kg (155 lb 4.8 oz), SpO2 99 %.Body mass index is 26.64 kg/(m^2).  General Appearance: Fairly Groomed  Engineer, water::  Fair  Speech:  Clear and Coherent  Volume:  Normal  Mood:  Anxious improving   Affect:  Appropriate  Thought Process:  Disorganized, Irrelevant and Loose improving  Orientation:  Full (Time, Place, and Person)  Thought Content:  Delusions, Hallucinations: Auditory, Paranoid Ideation and Ruminationimproving  Suicidal Thoughts:  No  Homicidal Thoughts:  No  Memory:  Immediate;   Fair Recent;   Fair Remote;   Fair  Judgement:  Fair  Insight:  Fair  Psychomotor Activity:  Normal  Concentration:  Fair  Recall:  [employer] of Knowledge:Fair  Language: Fair  Akathisia:  No  Handed:  Right  AIMS (if indicated):     Assets:  Communication Skills Desire for Improvement  Sleep:  Number of Hours: 6.75   Musculoskeletal: Strength & Muscle Tone: within normal limits Gait & Station: normal Patient leans: N/A  Current Medications: Current Facility-Administered Medications  Medication Dose Route Frequency Provider Last Rate Last Dose  . acetaminophen (TYLENOL) tablet 650 mg  650 mg Oral Q6H PRN Benjamine Mola, FNP   650 mg at 07/23/14 1821  . albuterol (PROVENTIL) (2.5 MG/3ML) 0.083% nebulizer solution 2.5 mg  2.5 mg Nebulization Q6H PRN Benjamine Mola, FNP      . albuterol-ipratropium (COMBIVENT) inhaler 2 puff  2 puff Inhalation Q6H PRN Benjamine Mola, FNP      . alum & mag hydroxide-simeth (MAALOX/MYLANTA) 200-200-20 MG/5ML suspension 30 mL  30 mL Oral Q4H PRN Benjamine Mola, FNP      . amLODipine (NORVASC) tablet 10 mg  10 mg Oral Daily Benjamine Mola, FNP   10 mg at 07/24/14 1443  . aspirin EC tablet 81 mg  81 mg Oral Daily Benjamine Mola, FNP   81 mg at 07/24/14 8115  . benztropine (COGENTIN) tablet 1 mg  1 mg Oral BID Ursula Alert, MD   1 mg at 07/24/14 0728  . carvedilol (COREG) tablet 6.25 mg  6.25 mg Oral BID WC Benjamine Mola, FNP   6.25 mg at 07/24/14 7262  . clonazePAM (KLONOPIN) tablet 0.5 mg  0.5 mg Oral TID PRN Ursula Alert, MD   0.5 mg at 07/24/14 1205  . DULoxetine (CYMBALTA) DR capsule 60 mg  60 mg Oral Daily Benjamine Mola, FNP   60 mg at  07/24/14 0355  . estradiol (ESTRACE) vaginal cream 1 Applicatorful  1 Applicatorful Vaginal Daily Benjamine Mola, FNP   1 Applicatorful at 97/41/63 [phone]  . gabapentin (NEURONTIN) capsule 100 mg  100 mg Oral QHS Benjamine Mola, FNP   100 mg at 07/23/14 2125  . glimepiride (AMARYL) tablet 2 mg  2 mg Oral Q breakfast Benjamine Mola, FNP   2 mg at 07/24/14 0729  . glycopyrrolate (ROBINUL) tablet 2 mg  2 mg Oral TID PRN Benjamine Mola, FNP      . haloperidol (HALDOL) tablet 5 mg  5 mg Oral Q supper Ursula Alert, MD   5 mg at 07/23/14 1631  . haloperidol (HALDOL) tablet 5 mg  5 mg Oral Daily Ursula Alert, MD   5 mg at 07/24/14 0728  . lamoTRIgine (LAMICTAL) tablet 25 mg  25 mg Oral BID Ursula Alert, MD   25 mg at 07/24/14 0728  . lidocaine (LIDODERM) 5 % 3 patch  3 patch Transdermal Q24H Benjamine Mola, FNP   3 patch at 07/24/14 [phone]  . liver oil-zinc oxide (DESITIN) 40 % ointment   Topical PRN Ursula Alert, MD   1 application at 11/20/20 2032  . loperamide (IMODIUM) capsule 2 mg  2 mg Oral PRN Ursula Alert, MD   2 mg at 07/18/14 1250  . magnesium hydroxide (MILK OF MAGNESIA) suspension 30 mL  30 mL Oral Daily PRN Benjamine Mola, FNP      . methocarbamol (ROBAXIN) tablet 500 mg  500 mg Oral Q8H PRN Benjamine Mola, FNP   500 mg at 07/23/14 2125  . neomycin-bacitracin-polymyxin (NEOSPORIN) ointment   Topical BID Ursula Alert, MD      . nicotine (NICODERM CQ - dosed in mg/24 hours) patch 14 mg  14 mg Transdermal Daily Ursula Alert, MD   14 mg at 07/24/14 0734  . OLANZapine zydis (ZYPREXA) disintegrating tablet 5 mg  5 mg Oral Q8H PRN Benjamine Mola, FNP   5 mg at 07/23/14 1632  . oxyCODONE-acetaminophen (PERCOCET/ROXICET) 5-325 MG per tablet 1 tablet  1 tablet Oral BID PRN Benjamine Mola, FNP   1 tablet at 07/24/14 1205  . pantoprazole (PROTONIX) EC tablet 40 mg  40 mg Oral Daily Benjamine Mola, FNP   40 mg at 07/24/14 0729  . pravastatin (PRAVACHOL) tablet 80 mg  80 mg Oral Daily Benjamine Mola, FNP   80 mg at 07/24/14 4825    Lab Results:  No results found for this or any previous visit (from the past 48 hour(s)).  Physical Findings: AIMS: Facial and Oral Movements Muscles of Facial Expression: None, normal Lips and Perioral Area: None, normal Jaw: None, normal Tongue: None, normal,Extremity Movements Upper (arms, wrists, hands, fingers): None, normal Lower (legs, knees, ankles, toes): None, normal, Trunk Movements Neck, shoulders, hips: None, normal, Overall Severity  Severity of abnormal movements (highest score from questions above): None, normal Incapacitation due to abnormal movements: None, normal Patient's awareness of abnormal movements (rate only patient's report): No Awareness, Dental Status Current problems with teeth and/or dentures?: No Does patient usually wear dentures?: No  CIWA:    COWS:     Treatment Plan Summary: Daily contact with patient to assess and evaluate symptoms and progress in treatment Medication management Assessment:  Plan: 1. Continue Haldol 10 mg po daily with Cogentin 0.5 mg po bid for eps.AIMS - 1 (07/22/14)-has tremors around her lips.Hence her Haldol had to be discontinued. 2.Will continue Lamictal   25 mg  Po bid for improved mood stability.  3.Continue Cymbalta for affective sx and pain. 4.Continue Gabapentin 100 mg po qhs . 5.Will continue   Klonopin 0.5 mg po tid prn for anxiety sx. 6.Will discontinue Vistaril prn for anxiety . 7.Will restart home medications where needed.  8.Will continue to monitor vitals ,medication compliance and treatment side effects while patient is here.  9.Will monitor for medical issues as well as call consult as needed. Continue percocet one tablet bid prn for chronic knee pain.  10. CSW to arrange home health consult to assist with administration of medications after discharge. Patient to be discharged Monday if she continues to be stable.  Medical Decision Making Problem Points:   Established problem, stable/improving (1), Review of last therapy session (1) and Review of psycho-social stressors (1) Data Points:  Order Aims Assessment (2) Review or order clinical lab tests (1) Review of medication regiment & side effects (2) Review of new medications or change in dosage (2)  I certify that inpatient services furnished can reasonably be expected to improve the patient's condition.   Roselani Grajeda MD 07/24/2014, 2:37 PM

## 2014-07-24 NOTE — Progress Notes (Signed)
Psychoeducational Group Note  Date:  07/24/2014 Time: 1015 Group Topic/Focus:  Making Healthy Choices:   The focus of this group is to help patients identify negative/unhealthy choices they were using prior to admission and identify positive/healthier coping strategies to replace them upon discharge.  Participation Level:  Active  Participation Quality:  Appropriate  Affect:  Appropriate  Cognitive:  Alert  Insight:  Engaged  Engagement in Group:  Engaged  Additional Comments:    Lauralyn Primes 2:03 PM. 07/24/2014

## 2014-07-25 ENCOUNTER — Other Ambulatory Visit: Payer: Self-pay | Admitting: Family Medicine

## 2014-07-25 MED ORDER — GLYCOPYRROLATE 2 MG PO TABS: 2.0000 mg | ORAL_TABLET | Freq: Three times a day (TID) | ORAL | Status: DC | PRN

## 2014-07-25 MED ORDER — BENZTROPINE MESYLATE 1 MG PO TABS: 1.0000 mg | ORAL_TABLET | Freq: Two times a day (BID) | ORAL | Status: DC

## 2014-07-25 MED ORDER — OMEPRAZOLE 20 MG PO CPDR: 20.0000 mg | DELAYED_RELEASE_CAPSULE | Freq: Two times a day (BID) | ORAL | Status: DC

## 2014-07-25 MED ORDER — METHOCARBAMOL 500 MG PO TABS: 500.0000 mg | ORAL_TABLET | Freq: Three times a day (TID) | ORAL | Status: DC | PRN

## 2014-07-25 MED ORDER — DICLOFENAC SODIUM 1 % TD GEL
2.0000 g | TRANSDERMAL | Status: DC | PRN
Start: 2014-07-25 — End: 2014-08-30

## 2014-07-25 MED ORDER — OLANZAPINE 5 MG PO TBDP: 5.0000 mg | ORAL_TABLET | Freq: Three times a day (TID) | ORAL | Status: DC | PRN

## 2014-07-25 MED ORDER — GLIMEPIRIDE 2 MG PO TABS: ORAL_TABLET | ORAL | Status: DC

## 2014-07-25 MED ORDER — BENZTROPINE MESYLATE 1 MG PO TABS: 1.0000 mg | ORAL_TABLET | Freq: Two times a day (BID) | ORAL | Status: DC | PRN

## 2014-07-25 MED ORDER — DULOXETINE HCL 60 MG PO CPEP: 60.0000 mg | ORAL_CAPSULE | Freq: Every day | ORAL | Status: DC

## 2014-07-25 MED ORDER — ALBUTEROL SULFATE (2.5 MG/3ML) 0.083% IN NEBU: 2.5000 mg | INHALATION_SOLUTION | Freq: Four times a day (QID) | RESPIRATORY_TRACT | Status: AC | PRN

## 2014-07-25 MED ORDER — LIDOCAINE 5 % EX PTCH: 3.0000 | MEDICATED_PATCH | Freq: Two times a day (BID) | CUTANEOUS | Status: DC

## 2014-07-25 MED ORDER — GABAPENTIN 100 MG PO CAPS: 100.0000 mg | ORAL_CAPSULE | Freq: Every day | ORAL | Status: DC

## 2014-07-25 MED ORDER — PRAVASTATIN SODIUM 80 MG PO TABS: 80.0000 mg | ORAL_TABLET | Freq: Every day | ORAL | Status: AC

## 2014-07-25 MED ORDER — LAMOTRIGINE 25 MG PO TABS: 25.0000 mg | ORAL_TABLET | Freq: Two times a day (BID) | ORAL | Status: DC

## 2014-07-25 MED ORDER — ESTRADIOL 0.1 MG/GM VA CREA: TOPICAL_CREAM | VAGINAL | Status: DC

## 2014-07-25 MED ORDER — NITROGLYCERIN 0.4 MG SL SUBL: 0.4000 mg | SUBLINGUAL_TABLET | SUBLINGUAL | Status: AC | PRN

## 2014-07-25 MED ORDER — CARVEDILOL 12.5 MG PO TABS: ORAL_TABLET | ORAL | Status: DC

## 2014-07-25 MED ORDER — TRIAMCINOLONE ACETONIDE 55 MCG/ACT NA AERO: 2.0000 | INHALATION_SPRAY | Freq: Every day | NASAL | Status: DC

## 2014-07-25 MED ORDER — FLUTICASONE PROPIONATE 0.05 % EX CREA: TOPICAL_CREAM | CUTANEOUS | Status: DC

## 2014-07-25 MED ORDER — ASPIRIN 81 MG PO TBEC: 81.0000 mg | DELAYED_RELEASE_TABLET | Freq: Every day | ORAL | Status: DC

## 2014-07-25 NOTE — [facility] Suicide Risk Assessment (Signed)
[facility] INPATIENT:  Family/Significant Other Suicide Prevention Education  Suicide Prevention Education:  Patient Refusal for Family/Significant Other Suicide Prevention Education: The patient [name] has refused to provide written consent for family/significant other to be provided Family/Significant Other Suicide Prevention Education during admission and/or prior to discharge.  Physician notified.  SPE completed with pt. SPI pamphlet provided to pt and she was encouraged to share information with support network, ask questions, and talk about any concerns relating to SPE.  Smart, Kynnedy Carreno LCSWA  07/25/2014, 10:22 AM

## 2014-07-25 NOTE — [facility] Suicide Risk Assessment (Signed)
Demographic Factors:  68 year old caucasian female, retired, separated   Total Time spent with patient: 30 minutes  Psychiatric Specialty Exam: Physical Exam  ROS  Blood pressure 125/77, pulse 128, temperature 97.7 F (36.5 C), temperature source Oral, resp. rate 20, height 5\' 4"  (1.626 m), weight 70.444 kg (155 lb 4.8 oz), SpO2 99 %.Body mass index is 26.64 kg/(m^2).  General Appearance: improved grooming  Eye Contact::  Good  Speech:  Normal Rate  Volume:  Normal  Mood:  states her mood is "OK". Denies Depression at this time  Affect:  Appropriate  Thought Process:  Linear and does become somewhat tangential with open ended questions  Orientation:  Other:  fully alert and attentive  Thought Content:  denies hallucinations, does continue to express paranoid ideations such as feeling that she was under some surveillance prior to her admission  Suicidal Thoughts:  No denies any suicidal or homicidal ideations , and contracts for safety on unit  Homicidal Thoughts:  No  Memory:  Recent and remote grossly intact   Judgement:  Fair  Insight:  Fair  Psychomotor Activity:  Normal  Concentration:  Good  Recall:  Good  Fund of Knowledge:Good  Language: Good  Akathisia:  No  Handed:  Right  AIMS (if indicated):     Assets:  Communication Skills Desire for Improvement Resilience  Sleep:  Number of Hours: 6.75    Musculoskeletal: Strength & Muscle Tone: within normal limits Gait & Station: normal Patient leans: N/A   Mental Status Per Nursing Assessment::   On Admission:  NA  Current Mental Status by Physician: At this time patient states she is feeling "OK", and denies depression. She has a full range of affect. She seems goal directed in affect, except at times she does tend to become somewhat tangential when describing events she felt occurred prior to her admission. She is not suicidal or homicidal and is future oriented. She denies hallucinations and does not appear  internally preoccupied at this time. She does express paranoid ideations, and states she feels there was somebody monitoring her or trying to steal from her at her home prior to admission. States she has had no such experiences since she was admitted to the hospital. Her behavior is calm and in control.  Loss Factors: 2 children passed away in the past , limited support network, retired.  Historical Factors: Has been diagnosed with Bipolar Disorder and has had prior admissions, last in 2013. Denies history of suicidal ideations or self injurious ideations  Risk Reduction Factors:   Sense of responsibility to family, Religious beliefs about death and Positive coping skills or problem solving skills- describes religious beliefs and  " knowing that God wants me to love everyone" as protective factors from hurting herself or anyone else   Continued Clinical Symptoms:  As discussed with staff, patient is improved compared to admission. Mood is improved and at this time euthymic, affect appropriate, no current SI or HI, no hallucinations, ongoing paranoid ideations, but less focused on these. Behavior calm and in good control. Future oriented.  States she is not having any medication side effects.  Cognitive Features That Contribute To Risk:  Limited insight of mental illness, but states she does plan to follow up as instructed and to continue taking her medications as prescribed.  Suicide Risk:  Mild:  Suicidal ideation of limited frequency, intensity, duration, and specificity.  There are no identifiable plans, no associated intent, mild dysphoria and related symptoms, good self-control (  both objective and subjective assessment), few other risk factors, and identifiable protective factors, including available and accessible social support.  Discharge Diagnoses:   AXIS I:  Bipolar Disorder, Mixed, with Psychotic Symptoms AXIS II:  Deferred AXIS III:   Past Medical History  Diagnosis Date  .  Obesity   . Seizure   . Skin cancer   . Asthma   . GERD (gastroesophageal reflux disease)   . Hypertension   . Tobacco user   . CAD (coronary artery disease)     Moderate disease of the left circumflex managed medically  . Fibromyalgia   . Chronic pain   . Diverticul disease small and large intestine, no perforati or abscess   . Gastroparesis     abnl gastric emptying scan 2010 (Dr. Deatra Ina)  . Anxiety   . Hyperlipidemia   . Urge and stress incontinence   . Chronic cystitis   . Rectal prolapse   . Neurotic excoriations 01/28/2013  . Leukocytosis     chronic, mild, s/p eval 03/2013 by hematologist --reassured; likely secondary to ongoing smoking  . Type II or unspecified type diabetes mellitus with neurological manifestations, not stated as uncontrolled(250.60) 08/17/2008    Hx of diabetic foot ulcer.  . Bipolar disorder     Psych hosp admission 08/2012--manic episode  . COPD (chronic obstructive pulmonary disease)   . Right knee pain Summer 2014    MRI 04/2013: large popliteal fossa ganglion cyst, mod medial compartment and patellofemoral osteoarth--referred to [facility] at that time (Dr. Berenice Primas)  . Muscle spasm   . DDD (degenerative disc disease), cervical   . DDD (degenerative disc disease), lumbar     MRI 12/2013: right foraminal disc protrusion L4-5, mild facet hypertrophy L5-S1--gets ESI's by Dr. Vira Blanco   AXIS IV:  Retirement, limited support network AXIS V:  Approximately 60-65 upon discharge  Plan Of Care/Follow-up recommendations:  Activity:  As tolerated Diet:  Low sodium, heart healthy, diabetic diet Tests:  NA Other:  See below  Is patient on multiple antipsychotic therapies at discharge:  No   Has Patient had three or more failed trials of antipsychotic monotherapy by history:  No  Recommended Plan for Multiple Antipsychotic Therapies: NA  Patient is leaving unit in good spirits. She is requesting discharge and states she feels  " ready".Of note, there  are no current grounds for involuntary commitment at this time. Plans to return home  Follow up as below- Follow up with [facility] On 07/27/2014.    Why: Appt. with Dr. Anitra Lauth for hospital follow-up/medication management on this date at 2:30PM.   Contact information:   ATTN: Dr. Anitra Lauth [address], [place] [address] Phone: [phone] Fax: [phone]      Follow up with [facility] On 08/17/2014.   Why: Appt. with Dr. Leslie Dales on this date at 2:45PM for therapy. Make sure to bring completed "New Patient Packet" to this appt. You may want to verify with Aurea Graff that Dr. Leslie Dales is an in-network provider.   Contact information:   ATTN: Dr. Leslie Dales [address] [place], [place] [address] Phone: [phone] Fax: X      Follow up with [facility] On 07/26/2014.   Why: [facility] will come to your home on this date to resume services per [facility] (PT, OT, nursing aid, and social work)   Sport and exercise psychologist information:   [address] [place], [place] [address] Phone: [phone]      Neita Garnet  07/25/2014, 2:46 PM

## 2014-07-25 NOTE — Progress Notes (Signed)
[facility] Adult Case Management Discharge Plan :  Will you be returning to the same living situation after discharge: Yes,  home At discharge, do you have transportation home?:Yes,  [facility] coming at approximately 2:45PM to transport back to [facility]. From there, pt plans to call cab.  Do you have the ability to pay for your medications:Yes,  Managed Medicare  Release of information consent forms completed and submitted to Medical Records by CSW.  Patient to Follow up at: Follow-up Information    Follow up with  [facility] On 07/27/2014.   Why:  Appt. with Dr. Anitra Lauth for hospital follow-up/medication management on this date at 2:30PM.   Contact information:   ATTN: Dr. Anitra Lauth [address] [place] [address] Phone: [phone] Fax: [phone]      Follow up with [facility] On 08/17/2014.   Why:  Appt. with Dr. Leslie Dales on this date at 2:45PM for therapy. Make sure to bring completed "New Patient Packet" to this appt. You may want to verify with Aurea Graff that Dr. Leslie Dales is an in-network provider.   Contact information:   ATTN: Dr. Leslie Dales [address] [place], [place] [address] Phone: [phone] Fax: [phone]      Follow up with [facility] On 07/26/2014.   Why:  [facility] will come to your home on this date to resume services per [facility]yr[facility] Mour[facility] (PT, OT, nursing aid, and social work)   Sport and exercise psychologist information:   [address] [place], [place] [address] Phone: [phone] Fax: [phone]      Patient denies SI/HI:   Yes,  during group/self report.    Safety Planning and Suicide Prevention discussed:  Yes,  SPE completed with pt, as she did not consent to family contact. SPI pamphlet provided to pt and she was encouraged to share information with support network, ask questions, and talk about any concerns relating to SPE.  Smart, [name] LCSWA  07/25/2014, 10:25 AM
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
## 2014-07-25 NOTE — Progress Notes (Signed)
Patient ID: [name], female   DOB: 09-14-1945, 68 y.o.   MRN: [id number] PER STATE REGULATIONS 482.30  THIS CHART WAS REVIEWED FOR MEDICAL NECESSITY WITH RESPECT TO THE PATIENT'S ADMISSION/ DURATION OF STAY.  NEXT REVIEW DATE: 07/29/2014  Chauncy Lean, RN, BSN CASE MANAGER '

## 2014-07-25 NOTE — Plan of Care (Signed)
Problem: Alteration in mood Goal: STG-Able to complete baseline activities of daily living Outcome: Progressing Patient has taken care of her basic ADL's .

## 2014-07-25 NOTE — Progress Notes (Signed)
Writer observed patient up and active on the unit. She has voiced no complaints and is hopeful to discharge on Monday. She reports that hopefully she will get help with a care -giver once at home. She denies si/hi/a/v hallucinations. She is compliant with her medications and voiced no complaints.

## 2014-07-25 NOTE — [facility] Group Notes (Signed)
Adult Psychoeducational Group Note  Date:  07/25/2014 Time:  5:55 AM  Group Topic/Focus:  Wrap-Up Group:   The focus of this group is to help patients review their daily goal of treatment and discuss progress on daily workbooks.  Participation Level:  Active  Participation Quality:  Appropriate and Attentive  Affect:  Appropriate  Cognitive:  Appropriate  Insight: Good  Engagement in Group:  Engaged  Modes of Intervention:  Support  Additional Comments:  Pt was asked to describe how day went and if any concerns.  Pt stated, "It's the best day I have had in a long time", also stated that it was the best feeling day that she has had as well.  [name] Coursey 07/25/2014, 5:55 AM

## 2014-07-25 NOTE — Progress Notes (Signed)
Patient discharged per physician order; patient denies SI/HI and A/V hallucinations; patient received samples, prescriptions, and copy of AVS after it was reviewed; patient had no other questions or concerns at this time; patient verbalized and signed that she received all belongings; patient left the unit ambulatory and transported by [facility]

## 2014-07-25 NOTE — [facility] Group Notes (Signed)
[facility] LCSW Aftercare Discharge Planning Group Note   07/25/2014 10:44 AM  Participation Quality:  Appropriate   Mood/Affect:  Appropriate  Depression Rating:  0  Anxiety Rating:  0  Thoughts of Suicide:  No Will you contract for safety?   NA  Current AVH:  No  Plan for Discharge/Comments:  Pt reports that she had a good weekend and is looking forward to returning home today. Pt thanked CSW and patients for their kindness to her. Pt pleasant during group.   Transportation Means: Betsy Pries will pick up pt around 2:45PM today to take her to [facility]. From there, pt plans to call cab.   Supports: daughter  Higinio Roger

## 2014-07-25 NOTE — [facility] Group Notes (Signed)
[facility] LCSW Group Therapy  07/25/2014 1:38 PM  Type of Therapy:  Group Therapy  Participation Level:  Active  Participation Quality:  Attentive  Affect:  Appropriate  Cognitive:  Alert and Oriented  Insight:  Engaged  Engagement in Therapy:  Engaged  Modes of Intervention:  Confrontation, Discussion, Education, Exploration, Problem-solving, Rapport Building, Socialization and Support  Summary of Progress/Problems: Group Topic-Resiliency and Vulnerability: Group members were asked to define resiliency and vulnerability and explore how resiliency and vulnerability have affected their lives. Group members were invited to share personal stories detailing their resiliency and vulnerability factors and explore how resilient/vulnerable they feel in their current situations. [name] was attentive and engaged during today's processing group. She shared that "experiences and learning from them" is a resiliency factor for her. [name] remained attentive during group and actively listened to others. [name] shows progress in the group setting and improving insight.   Smart, Koichi Platte LCSWA 07/25/2014, 1:38 PM

## 2014-07-26 ENCOUNTER — Telehealth: Payer: Self-pay | Admitting: Family Medicine

## 2014-07-26 NOTE — Telephone Encounter (Signed)
Pt requesting rf of combivent and clonazepam.  I could not refill the combivent for some reason.  Last OV for pt was 06/27/14.  It looks like clonazepam was d/c'd from patient's med list.  Please advise.   Also,  Pt pharmacy requesting order for bubble packaging be faxed to them at [phone].

## 2014-07-26 NOTE — Telephone Encounter (Signed)
I spoke with [name] at [facility] in [place].  He states that clonazepam is not listed on her discharge summary.  So please disregard previous message.  I also verbally refilled her nasocort, aspirin 81 and fish oil x 3 rfs and gave verbal for bubble packaging.

## 2014-07-27 ENCOUNTER — Ambulatory Visit: Payer: Medicare Other | Admitting: Family Medicine

## 2014-07-27 NOTE — Telephone Encounter (Signed)
Pt states that she wants her klonopin refilled.  Pt states the medication from [facility] isn't helping her.  Please advise.

## 2014-07-27 NOTE — Telephone Encounter (Signed)
Pls call pt and see what she needs.

## 2014-07-27 NOTE — Telephone Encounter (Signed)
Pt called requesting CB from Dr. Anitra Lauth.

## 2014-07-27 NOTE — Progress Notes (Signed)
Patient Discharge Instructions:  After Visit Summary (AVS):   Faxed to:  07/27/14 Discharge Summary Note:   Faxed to:  07/27/14 Psychiatric Admission Assessment Note:   Faxed to:  07/27/14 Suicide Risk Assessment - Discharge Assessment:   Faxed to:  07/27/14 Faxed/Sent to the Next Level Care provider:  07/27/14 Next Level Care Provider Has Access to the EMR, 07/27/14  Faxed to [facility] @ [phone] Faxed to [facility] - Dr. Ricardo Jericho @ [phone] Records provided to [facility] via [facility]/Epic access.  Patsey Berthold, 07/27/2014, 2:17 PM

## 2014-07-27 NOTE — Telephone Encounter (Signed)
I reviewed all of her hospital records and they specifically wanted her to NOT be on this medication anymore b/c she was doing ok on the meds that they started her on in the [facility] admission.  Please encourage her to continue taking the meds she was discharged on and give them more time.  No more klonopin.--thx

## 2014-07-27 NOTE — Telephone Encounter (Signed)
Pt aware of this and she seemed okay with discussion.  She has an appointment next week to follow up from Hospital.

## 2014-08-02 ENCOUNTER — Other Ambulatory Visit: Payer: Self-pay | Admitting: Family Medicine

## 2014-08-03 ENCOUNTER — Ambulatory Visit: Payer: Medicare Other | Admitting: Family Medicine

## 2014-08-10 ENCOUNTER — Ambulatory Visit: Payer: Self-pay | Admitting: Nurse Practitioner

## 2014-08-10 ENCOUNTER — Encounter: Payer: Self-pay | Admitting: Family Medicine

## 2014-08-17 ENCOUNTER — Encounter: Payer: Self-pay | Admitting: Family Medicine

## 2014-08-17 ENCOUNTER — Ambulatory Visit (INDEPENDENT_AMBULATORY_CARE_PROVIDER_SITE_OTHER): Payer: Medicare Other | Admitting: Family Medicine

## 2014-08-17 ENCOUNTER — Ambulatory Visit (HOSPITAL_COMMUNITY): Payer: Self-pay | Admitting: Psychology

## 2014-08-17 VITALS — BP 144/94 | HR 98 | Temp 98.8°F | Resp 18 | Ht 61.0 in | Wt 150.0 lb

## 2014-08-17 DIAGNOSIS — J3089 Other allergic rhinitis: Secondary | ICD-10-CM

## 2014-08-17 DIAGNOSIS — G894 Chronic pain syndrome: Secondary | ICD-10-CM

## 2014-08-17 DIAGNOSIS — G8929 Other chronic pain: Secondary | ICD-10-CM

## 2014-08-17 DIAGNOSIS — K6289 Other specified diseases of anus and rectum: Secondary | ICD-10-CM

## 2014-08-17 DIAGNOSIS — F3181 Bipolar II disorder: Secondary | ICD-10-CM

## 2014-08-17 MED ORDER — FEXOFENADINE HCL 60 MG PO TABS: ORAL_TABLET | ORAL | Status: DC

## 2014-08-17 NOTE — Progress Notes (Signed)
OFFICE NOTE  08/17/2014  CC:  Chief Complaint  Patient presents with  . Hospitalization Follow-up   HPI: Patient is a 68 y.o. Caucasian female who is here for medication discussion. Says she has hx of rectocele/cystocel/vagicele and has had remote hx of surgical repair.  Now says she has pain in rectal area that she describes as "hemorrhoids".  Denies any bleeding.  Says it hurts worse with having BM or trying to urinate.  She says she is not ready to let me examine this area today.  She asks if I will take over prescribing her pain meds since she is no longer able to get transportation to/from her pain mgmt clinic now.  She also has some questions about her recent changes in psych meds after most recent psych hospitalization.  Pertinent PMH:  Past medical, surgical, social, and family history reviewed and no changes are noted since last office visit.  MEDS:  Outpatient Prescriptions Prior to Visit  Medication Sig Dispense Refill  . albuterol (PROVENTIL) (2.5 MG/3ML) 0.083% nebulizer solution Take 3 mLs (2.5 mg total) by nebulization every 6 (six) hours as needed for wheezing. 75 mL 6  . albuterol-ipratropium (COMBIVENT) 18-103 MCG/ACT inhaler Inhale 2 puffs into the lungs every 6 (six) hours as needed for wheezing. 1 Inhaler 1  . amLODipine (NORVASC) 10 MG tablet Take 1 tablet (10 mg total) by mouth daily. For hypertension. 30 tablet 3  . aspirin 81 MG EC tablet Take 1 tablet (81 mg total) by mouth daily. For platelette aggregation. 30 tablet 0  . benztropine (COGENTIN) 1 MG tablet Take 1 tablet (1 mg total) by mouth 2 (two) times daily as needed for tremors. 60 tablet 0  . carvedilol (COREG) 12.5 MG tablet 1/2 tab po bid 60 tablet 3  . diclofenac sodium (VOLTAREN) 1 % GEL Apply 2 g topically as needed. 100 g 3  . DULoxetine (CYMBALTA) 60 MG capsule Take 1 capsule (60 mg total) by mouth daily. 30 capsule 0  . estradiol (ESTRACE) 0.1 MG/GM vaginal cream 9.67 applicators full qd 59.1 g  6  . fish oil-omega-3 fatty acids 1000 MG capsule Take 2 capsules (2 g total) by mouth daily. For lipid control.    . fluticasone (CUTIVATE) 0.05 % cream Apply to affected areas of skin twice daily as needed 60 g 3  . gabapentin (NEURONTIN) 100 MG capsule Take 1 capsule (100 mg total) by mouth at bedtime. 30 capsule 0  . glimepiride (AMARYL) 2 MG tablet TAKE 1 TABLET BY MOUTH EVERY MORNING FOR DIABETES. 30 tablet 3  . glycopyrrolate (ROBINUL) 2 MG tablet Take 1 tablet (2 mg total) by mouth 3 (three) times daily as needed. 90 tablet 3  . lamoTRIgine (LAMICTAL) 25 MG tablet Take 1 tablet (25 mg total) by mouth 2 (two) times daily. 60 tablet 0  . methocarbamol (ROBAXIN) 500 MG tablet Take 1 tablet (500 mg total) by mouth 3 (three) times daily as needed. FOR MUSCLE SPASMS    . nitroGLYCERIN (NITROSTAT) 0.4 MG SL tablet Place 1 tablet (0.4 mg total) under the tongue every 5 (five) minutes as needed for chest pain. 90 tablet 3  . omeprazole (PRILOSEC) 20 MG capsule TAKE ONE CAPSULE BY MOUTH 2 TIMES A DAY. 60 capsule 1  . pravastatin (PRAVACHOL) 80 MG tablet Take 1 tablet (80 mg total) by mouth daily. 90 tablet 1  . triamcinolone (NASACORT AQ) 55 MCG/ACT AERO nasal inhaler Place 2 sprays into the nose daily. 1 Inhaler 12  .  DULoxetine (CYMBALTA) 60 MG capsule TAKE ONE CAPSULE BY MOUTH DAILY FOR ANXIETY AND DEPRESSION. 30 capsule 3  . lidocaine (LIDODERM) 5 % Place 3 patches onto the skin 2 (two) times daily. Remove & Discard patch within 12 hours or as directed by MD (Patient not taking: Reported on 08/17/2014) 30 patch 0  . OLANZapine zydis (ZYPREXA) 5 MG disintegrating tablet Take 1 tablet (5 mg total) by mouth every 8 (eight) hours as needed (agitation / psychosis). 30 tablet 0   No facility-administered medications prior to visit.    PE: Blood pressure 144/94, pulse 98, temperature 98.8 F (37.1 C), temperature source Temporal, resp. rate 18, height 5\' 1"  (1.549 m), weight 150 lb (68.04 kg), SpO2  96 %. Gen: Alert, well appearing.  Patient is oriented to person, place, time, and situation. AFFECT: pleasant, lucid thought and speech. No further exam today.  IMPRESSION AND PLAN:   1) Chronic musculoskeletal pain (primarily LB): she can't get to pain mgmt clinic anymore and she asked me if I would prescribe her pain meds.  I declined to prescribe her any controlled substances.  2) Nasal allergies: continue nasocort AQ, and I added allegra 60mg  bid today.  3) Psychotropic meds: she asked if she could stop her benztropine.   Recommended she continue all psychotropic meds (esp the new ones) and discuss any desired changes with her psychiatric provider.  4) Anal pain: she would not let me examine her today. If/when she lets me examine her problem, I can then help her in management/referral for the problem. She expressed understanding of this today.  An After Visit Summary was printed and given to the patient.  FOLLOW UP: prn

## 2014-08-17 NOTE — Progress Notes (Signed)
Pre visit review using our clinic review tool, if applicable. No additional management support is needed unless otherwise documented below in the visit note. 

## 2014-08-22 ENCOUNTER — Other Ambulatory Visit: Payer: Self-pay | Admitting: Family Medicine

## 2014-08-22 ENCOUNTER — Other Ambulatory Visit (HOSPITAL_COMMUNITY): Payer: Self-pay | Admitting: Psychiatry

## 2014-08-30 ENCOUNTER — Other Ambulatory Visit: Payer: Self-pay | Admitting: Family Medicine

## 2014-08-30 NOTE — Telephone Encounter (Signed)
Please advise rf of voltaren gel.

## 2014-09-22 ENCOUNTER — Other Ambulatory Visit: Payer: Self-pay | Admitting: Family Medicine

## 2014-09-26 ENCOUNTER — Other Ambulatory Visit: Payer: Self-pay | Admitting: Family Medicine

## 2014-09-29 ENCOUNTER — Ambulatory Visit: Payer: Medicare Other | Admitting: Family Medicine

## 2014-10-09 ENCOUNTER — Encounter (HOSPITAL_COMMUNITY): Payer: Self-pay | Admitting: Emergency Medicine

## 2014-10-09 ENCOUNTER — Emergency Department (HOSPITAL_COMMUNITY)
Admission: EM | Admit: 2014-10-09 | Discharge: 2014-10-11 | Disposition: A | Discharge status: Other Acute Inpatient Hospital | Payer: Medicare Other | Attending: Emergency Medicine | Admitting: Emergency Medicine

## 2014-10-09 DIAGNOSIS — Z7951 Long term (current) use of inhaled steroids: Secondary | ICD-10-CM | POA: Insufficient documentation

## 2014-10-09 DIAGNOSIS — Z8739 Personal history of other diseases of the musculoskeletal system and connective tissue: Secondary | ICD-10-CM | POA: Insufficient documentation

## 2014-10-09 DIAGNOSIS — E119 Type 2 diabetes mellitus without complications: Secondary | ICD-10-CM | POA: Insufficient documentation

## 2014-10-09 DIAGNOSIS — I10 Essential (primary) hypertension: Secondary | ICD-10-CM | POA: Diagnosis not present

## 2014-10-09 DIAGNOSIS — F419 Anxiety disorder, unspecified: Secondary | ICD-10-CM | POA: Diagnosis not present

## 2014-10-09 DIAGNOSIS — K219 Gastro-esophageal reflux disease without esophagitis: Secondary | ICD-10-CM | POA: Insufficient documentation

## 2014-10-09 DIAGNOSIS — Z85828 Personal history of other malignant neoplasm of skin: Secondary | ICD-10-CM | POA: Diagnosis not present

## 2014-10-09 DIAGNOSIS — E785 Hyperlipidemia, unspecified: Secondary | ICD-10-CM | POA: Diagnosis not present

## 2014-10-09 DIAGNOSIS — Z91040 Latex allergy status: Secondary | ICD-10-CM | POA: Diagnosis not present

## 2014-10-09 DIAGNOSIS — G8929 Other chronic pain: Secondary | ICD-10-CM | POA: Diagnosis not present

## 2014-10-09 DIAGNOSIS — G40909 Epilepsy, unspecified, not intractable, without status epilepticus: Secondary | ICD-10-CM | POA: Insufficient documentation

## 2014-10-09 DIAGNOSIS — Z880 Allergy status to penicillin: Secondary | ICD-10-CM | POA: Diagnosis not present

## 2014-10-09 DIAGNOSIS — Z720 Tobacco use: Secondary | ICD-10-CM | POA: Insufficient documentation

## 2014-10-09 DIAGNOSIS — Z7982 Long term (current) use of aspirin: Secondary | ICD-10-CM | POA: Diagnosis not present

## 2014-10-09 DIAGNOSIS — F319 Bipolar disorder, unspecified: Secondary | ICD-10-CM | POA: Insufficient documentation

## 2014-10-09 DIAGNOSIS — Z862 Personal history of diseases of the blood and blood-forming organs and certain disorders involving the immune mechanism: Secondary | ICD-10-CM | POA: Insufficient documentation

## 2014-10-09 DIAGNOSIS — E669 Obesity, unspecified: Secondary | ICD-10-CM | POA: Diagnosis not present

## 2014-10-09 DIAGNOSIS — F29 Unspecified psychosis not due to a substance or known physiological condition: Secondary | ICD-10-CM | POA: Diagnosis not present

## 2014-10-09 DIAGNOSIS — Z791 Long term (current) use of non-steroidal anti-inflammatories (NSAID): Secondary | ICD-10-CM | POA: Insufficient documentation

## 2014-10-09 DIAGNOSIS — I2510 Atherosclerotic heart disease of native coronary artery without angina pectoris: Secondary | ICD-10-CM | POA: Insufficient documentation

## 2014-10-09 DIAGNOSIS — Z9889 Other specified postprocedural states: Secondary | ICD-10-CM | POA: Diagnosis not present

## 2014-10-09 DIAGNOSIS — J449 Chronic obstructive pulmonary disease, unspecified: Secondary | ICD-10-CM | POA: Insufficient documentation

## 2014-10-09 LAB — BASIC METABOLIC PANEL
ANION GAP: 10 (ref 5–15)
BUN: 10 mg/dL (ref 6–23)
CO2: 24 mmol/L (ref 19–32)
CREATININE: 0.71 mg/dL (ref 0.50–1.10)
Calcium: 9.5 mg/dL (ref 8.4–10.5)
Chloride: 102 mmol/L (ref 96–112)
GFR calc Af Amer: >90 mL/min (ref >90)
GFR calc non Af Amer: 88 mL/min — ABNORMAL LOW (ref >90)
GLUCOSE: 88 mg/dL (ref 70–99)
POTASSIUM: 3.5 mmol/L (ref 3.5–5.1)
Sodium: 136 mmol/L (ref 135–145)

## 2014-10-09 LAB — CBC WITH DIFFERENTIAL/PLATELET
Basophils Absolute: 0.1 10*3/uL (ref 0.0–0.1)
Basophils Relative: 1 % (ref 0–1)
EOS ABS: 0.1 10*3/uL (ref 0.0–0.7)
Eosinophils Relative: 1 % (ref 0–5)
HCT: 39.7 % (ref 36.0–46.0)
HEMOGLOBIN: 13.6 g/dL (ref 12.0–15.0)
LYMPHS ABS: 2.6 10*3/uL (ref 0.7–4.0)
LYMPHS PCT: 23 % (ref 12–46)
MCH: 28.3 pg (ref 26.0–34.0)
MCHC: 34.3 g/dL (ref 30.0–36.0)
MCV: 82.5 fL (ref 78.0–100.0)
MONOS PCT: 6 % (ref 3–12)
Monocytes Absolute: 0.7 10*3/uL (ref 0.1–1.0)
NEUTROS ABS: 7.9 10*3/uL — AB (ref 1.7–7.7)
Neutrophils Relative %: 69 % (ref 43–77)
Platelets: 347 10*3/uL (ref 150–400)
RBC: 4.81 MIL/uL (ref 3.87–5.11)
RDW: 14.6 % (ref 11.5–15.5)
WBC: 11.3 10*3/uL — ABNORMAL HIGH (ref 4.0–10.5)

## 2014-10-09 LAB — ETHANOL: Alcohol, Ethyl (B): <5 mg/dL (ref 0–9)

## 2014-10-09 LAB — CBG MONITORING, ED: Glucose-Capillary: 85 mg/dL (ref 70–99)

## 2014-10-09 MED ORDER — GLIMEPIRIDE 2 MG PO TABS
2.0000 mg | ORAL_TABLET | Freq: Every day | ORAL | Status: DC
  Administered 2014-10-10: 2 mg via ORAL
  Filled 2014-10-09 (×5): qty 1

## 2014-10-09 MED ORDER — IPRATROPIUM-ALBUTEROL 18-103 MCG/ACT IN AERO: 2.0000 | INHALATION_SPRAY | Freq: Four times a day (QID) | RESPIRATORY_TRACT | Status: DC | PRN

## 2014-10-09 MED ORDER — PRAVASTATIN SODIUM 40 MG PO TABS
80.0000 mg | ORAL_TABLET | Freq: Every day | ORAL | Status: DC
  Administered 2014-10-10: 80 mg via ORAL
  Filled 2014-10-09: qty 2
  Filled 2014-10-09: qty 1

## 2014-10-09 MED ORDER — DULOXETINE HCL 30 MG PO CPEP
60.0000 mg | ORAL_CAPSULE | Freq: Every day | ORAL | Status: DC
  Administered 2014-10-10: 60 mg via ORAL
  Filled 2014-10-09: qty 2

## 2014-10-09 MED ORDER — GABAPENTIN 100 MG PO CAPS
100.0000 mg | ORAL_CAPSULE | Freq: Every day | ORAL | Status: DC
  Administered 2014-10-10: 100 mg via ORAL
  Filled 2014-10-09: qty 1

## 2014-10-09 MED ORDER — FLUTICASONE PROPIONATE 0.05 % EX CREA
TOPICAL_CREAM | Freq: Two times a day (BID) | CUTANEOUS | Status: DC
  Filled 2014-10-09: qty 30

## 2014-10-09 MED ORDER — IPRATROPIUM-ALBUTEROL 0.5-2.5 (3) MG/3ML IN SOLN: 3.0000 mL | Freq: Four times a day (QID) | RESPIRATORY_TRACT | Status: DC | PRN

## 2014-10-09 MED ORDER — HALOPERIDOL LACTATE 5 MG/ML IJ SOLN
5.0000 mg | Freq: Four times a day (QID) | INTRAMUSCULAR | Status: DC | PRN
  Administered 2014-10-09: 5 mg via INTRAMUSCULAR
  Filled 2014-10-09: qty 1

## 2014-10-09 MED ORDER — PANTOPRAZOLE SODIUM 40 MG PO TBEC
40.0000 mg | DELAYED_RELEASE_TABLET | Freq: Every day | ORAL | Status: DC
  Administered 2014-10-10: 40 mg via ORAL
  Filled 2014-10-09: qty 1

## 2014-10-09 MED ORDER — LAMOTRIGINE 25 MG PO TABS
ORAL_TABLET | ORAL | Status: AC
  Filled 2014-10-09: qty 1

## 2014-10-09 MED ORDER — METOPROLOL SUCCINATE ER 50 MG PO TB24
50.0000 mg | ORAL_TABLET | Freq: Every day | ORAL | Status: DC
  Administered 2014-10-10: 50 mg via ORAL
  Filled 2014-10-09 (×4): qty 1

## 2014-10-09 MED ORDER — LAMOTRIGINE 25 MG PO TABS
25.0000 mg | ORAL_TABLET | Freq: Two times a day (BID) | ORAL | Status: DC
  Administered 2014-10-10 (×2): 25 mg via ORAL
  Filled 2014-10-09 (×9): qty 1

## 2014-10-09 MED ORDER — ASPIRIN EC 81 MG PO TBEC
81.0000 mg | DELAYED_RELEASE_TABLET | Freq: Every day | ORAL | Status: DC
  Administered 2014-10-10: 81 mg via ORAL
  Filled 2014-10-09: qty 1

## 2014-10-09 MED ORDER — AMLODIPINE BESYLATE 5 MG PO TABS
10.0000 mg | ORAL_TABLET | Freq: Every day | ORAL | Status: DC
  Administered 2014-10-10: 10 mg via ORAL
  Filled 2014-10-09: qty 2

## 2014-10-09 NOTE — ED Notes (Signed)
Pt continues to have loud outbursts and has been heard talking with someone named Mikki Santee (who is not in room). Pt guarded and suspicious. Pt declined evening meal d/t fear of poisoning.

## 2014-10-09 NOTE — [facility] Counselor (Addendum)
TTS Counselor faxed referral to the following facilities in effort to locate inpt gero-psych placement:  [facility]

## 2014-10-09 NOTE — ED Notes (Signed)
Pt yelling out & upset. Spoke to pt advising that she needed to lower her voice & calm down. Pt agreed.

## 2014-10-09 NOTE — [facility] Counselor (Signed)
Ginger RN advised counselor to call back in 10 mins to complete assessment.  Lorenza Cambridge, [facility] Triage Specialist

## 2014-10-09 NOTE — ED Provider Notes (Signed)
CSN: [id number]     Arrival date & time 10/09/14  1233 History   First MD Initiated Contact with Patient 10/09/14 1252     This chart was scribed for Veryl Speak, MD by Forrestine Him, ED Scribe. This patient was seen in room APA15/APA15 and the patient's care was started 12:59 PM.   Chief Complaint  Patient presents with  . V70.1   HPI  HPI Comments: [name] is a 69 y.o. female with a PMHx of HTN, GERD, DDD,  hyperlipidemia, DM, bipolar disorder, and COPD who presents to the Emergency Department here after being involuntarily committed today. Per nursing note, [employer] called by [employer] for [employer] to check on pt 2 days ago. Police attempted to serve IVC papers Friday but pt refused to come to the door. Police were then successful today with the help of a staff member. [name] was brought to the ED today for psychotic behavior, using the bathroom on the floor, "talking out of her head", visual hallucinations, and threats [place] She denies any pain at this time. Pt with several known allergies to medications as listed below.  Past Medical History  Diagnosis Date  . Obesity   . Seizure   . Skin cancer   . Asthma   . GERD (gastroesophageal reflux disease)   . Hypertension   . Tobacco user   . CAD (coronary artery disease)     Moderate disease of the left circumflex managed medically  . Fibromyalgia   . Chronic pain   . Diverticul disease small and large intestine, no perforati or abscess   . Gastroparesis     abnl gastric emptying scan 2010 (Dr. Deatra Ina)  . Anxiety   . Hyperlipidemia   . Urge and stress incontinence   . Chronic cystitis   . Rectal prolapse   . Neurotic excoriations 01/28/2013  . Leukocytosis     chronic, mild, s/p eval 03/2013 by hematologist --reassured; likely secondary to ongoing smoking  . Type II or unspecified type diabetes mellitus with neurological manifestations, not stated as uncontrolled(250.60) 08/17/2008     Hx of diabetic foot ulcer.  . Bipolar disorder     Psych hosp admission 08/2012--manic episode  . COPD (chronic obstructive pulmonary disease)   . Right knee pain Summer 2014    MRI 04/2013: large popliteal fossa ganglion cyst, mod medial compartment and patellofemoral osteoarth--referred to [facility] at that time (Dr. Berenice Primas)  . Muscle spasm   . DDD (degenerative disc disease), cervical   . DDD (degenerative disc disease), lumbar     MRI 12/2013: right foraminal disc protrusion L4-5, mild facet hypertrophy L5-S1--gets ESI's by Dr. Vira Blanco   Past Surgical History  Procedure Laterality Date  . Appendectomy    . Abdominal hysterectomy    . Tubal ligation    . Vagiocele    . Bladder surgery    . Eye surgery    . Rectocele repair    . Cholecystectomy    . Right foot surgery    . Cosmetic ear surgery    . Anterior cervical decomp/discectomy fusion      C5-C7 levels  . Esophagogastroduodenoscopy  05/2011    Dr. Vashti Hey at [facility]--s/p dilatation, o/w normal exam.  . Colonoscopy w/ biopsies  05/2009    Dr. Frederick Peers (bx showed benign colonic mucosa)  . Colonoscopy w/ biopsies  09/2002    Dr. Frederick Peers (  . Cardiovascular stress test  2002; 12/2008; 03/2012  2002: adenosine myoview NORMAL.  2010-Dr. Crenshaw: NORMAL adenosine myoview, no sign of scar or ischemia, EF normal.  2013-Dr. Nishan: NORMAL Lexiscan stress test, normal LV fxn/EF, normal wall motion  . Cardiac catheterization  08/2007    nonobstructive dz except 75% obstruction in OM, EF 60%--med mgmt   Family History  Problem Relation Age of Onset  . Cancer Mother   . Heart attack Sister   . Cancer Sister   . Asthma Sister   . Diabetes Brother   . Heart attack Brother   . Asthma Brother   . Heart disease Brother    History  Substance Use Topics  . Smoking status: Current Every Day Smoker -- 1.50 packs/day    Types: Cigarettes  . Smokeless tobacco: Never Used     Comment: Using  E-cigarette also.  Marland Kitchen Alcohol Use: No   OB History    No data available     Review of Systems  Constitutional: Negative for fever and chills.  Psychiatric/Behavioral: Positive for hallucinations, behavioral problems and agitation. Negative for suicidal ideas and self-injury. The patient is nervous/anxious and is hyperactive.   All other systems reviewed and are negative.     Allergies  Dust mite extract; Januvia; Latex; Metformin; Metoclopramide hcl; Penicillins; and Sulfonamide derivatives  Home Medications   Prior to Admission medications   Medication Sig Start Date End Date Taking? Authorizing Provider  albuterol (PROVENTIL) (2.5 MG/3ML) 0.083% nebulizer solution Take 3 mLs (2.5 mg total) by nebulization every 6 (six) hours as needed for wheezing. 07/25/14   Janett Labella, NP  albuterol-ipratropium [facility]) 18-103 MCG/ACT inhaler Inhale 2 puffs into the lungs every 6 (six) hours as needed for wheezing. 03/28/14   Tammi Sou, MD  amLODipine (NORVASC) 10 MG tablet TAKE ONE TABLET BY MOUTH DAILY FOR HIGH BLOOD PRESSURE. 09/22/14   Tammi Sou, MD  aspirin 81 MG EC tablet Take 1 tablet (81 mg total) by mouth daily. For platelette aggregation. 07/25/14   Freda Munro May Agustin, NP  benazepril (LOTENSIN) 20 MG tablet TAKE ONE TABLET BY MOUTH DAILY FOR HIGH BLOOD PRESSURE. 09/22/14   Tammi Sou, MD  benztropine (COGENTIN) 1 MG tablet Take 1 tablet (1 mg total) by mouth 2 (two) times daily as needed for tremors. 07/25/14   Freda Munro May Agustin, NP  carvedilol (COREG) 12.5 MG tablet TAKE (1/2) TABLET BY MOUTH TWICE DAILY. 09/22/14   Tammi Sou, MD  diclofenac sodium (VOLTAREN) 1 % GEL Apply 2g to affected joint up to 4 times daily as needed 08/30/14   Tammi Sou, MD  DULoxetine (CYMBALTA) 60 MG capsule Take 1 capsule (60 mg total) by mouth daily. 07/25/14   [place], NP  estradiol (ESTRACE) 0.1 MG/GM vaginal cream 5.17 applicators full qd 61/60/73   [facility], NP  fexofenadine (ALLEGRA) 60 MG tablet 1 tab po bid prn for nasal allergies 08/17/14   Tammi Sou, MD  fish oil-omega-3 fatty acids 1000 MG capsule Take 2 capsules (2 g total) by mouth daily. For lipid control. 08/15/12   Nena Polio, PA-C  fluticasone (CUTIVATE) 0.05 % cream APPLY TO AFFECTED AREAS OF THE SKIN TWICE DAILY AS NEEDED. 09/27/14   Tammi Sou, MD  gabapentin (NEURONTIN) 100 MG capsule Take 1 capsule (100 mg total) by mouth at bedtime. 07/25/14   Freda Munro May Agustin, NP  glimepiride (AMARYL) 2 MG tablet TAKE 1 TABLET BY MOUTH EVERY MORNING FOR DIABETES. 08/02/14   Tammi Sou, MD  glycopyrrolate (ROBINUL) 2 MG tablet Take 1 tablet (2 mg total) by mouth 3 (three) times daily as needed. 07/25/14   Freda Munro May Agustin, NP  lamoTRIgine (LAMICTAL) 25 MG tablet TAKE 1 TABLET BY MOUTH TWICE A DAY. 08/22/14   Tammi Sou, MD  lidocaine (LIDODERM) 5 % Place 3 patches onto the skin 2 (two) times daily. Remove & Discard patch within 12 hours or as directed by MD Patient not taking: Reported on 08/17/2014 07/25/14   Janett Labella, NP  methocarbamol (ROBAXIN) 500 MG tablet Take 1 tablet (500 mg total) by mouth 3 (three) times daily as needed. FOR MUSCLE SPASMS 07/25/14   Freda Munro May Agustin, NP  metoprolol succinate (TOPROL-XL) 50 MG 24 hr tablet TAKE ONE TABLET BY MOUTH EVERY DAY WITH OR IMMEDIATELY FOLLOWING A MEAL. 09/22/14   Tammi Sou, MD  nitroGLYCERIN (NITROSTAT) 0.4 MG SL tablet Place 1 tablet (0.4 mg total) under the tongue every 5 (five) minutes as needed for chest pain. 07/25/14   [place], NP  OLANZapine zydis (ZYPREXA) 5 MG disintegrating tablet Take 1 tablet (5 mg total) by mouth every 8 (eight) hours as needed (agitation / psychosis). 07/25/14   [place], NP  omeprazole (PRILOSEC) 20 MG capsule TAKE (1) CAPSULE BY MOUTH TWICE DAILY. 09/22/14   Tammi Sou, MD  pravastatin (PRAVACHOL) 80 MG tablet Take 1 tablet (80 mg  total) by mouth daily. 07/25/14   Freda Munro May Agustin, NP  triamcinolone (NASACORT AQ) 55 MCG/ACT AERO nasal inhaler Place 2 sprays into the nose daily. 07/25/14   Janett Labella, NP   Triage Vitals: BP 141/70 mmHg  Pulse 109  Resp 16  SpO2 100%  ' Physical Exam  Constitutional: She is oriented to person, place, and time. She appears well-developed and well-nourished. No distress.  HENT:  Head: Normocephalic and atraumatic.  Eyes: EOM are normal.  Neck: Normal range of motion.  Cardiovascular: Normal rate, regular rhythm and normal heart sounds.   Pulmonary/Chest: Effort normal and breath sounds normal.  Abdominal: Soft. She exhibits no distension. There is no tenderness.  Musculoskeletal: Normal range of motion.  Neurological: She is alert and oriented to person, place, and time.  Skin: Skin is warm and dry.  Psychiatric: Her mood appears anxious. Her affect is labile. Her speech is delayed. She is agitated, aggressive and hyperactive. Thought content is delusional. Cognition and memory are impaired. She expresses inappropriate judgment.  Nursing note and vitals reviewed.   ED Course  Procedures (including critical care time)  DIAGNOSTIC STUDIES: Oxygen Saturation is 100% on RA, Normal by my interpretation.    COORDINATION OF CARE: 12:54 PM-Discussed treatment plan with pt at bedside and pt agreed to plan.     Labs Review Labs Reviewed - No data to display  Imaging Review No results found.   EKG Interpretation None      MDM   Final diagnoses:  None    Patient is a 69 year old female brought for evaluation of erratic and psychotic behavior. She has a history of similar episodes and it appears as though she has been off her medications. She was brought under involuntary commitment by authorities. She will undergo TTS evaluation and I suspect will require admission. Care will be signed out to the oncoming provider at shift change.  I personally performed the  services described in this documentation, which was scribed in my presence. The recorded information has been reviewed and is accurate.      Nathaneil Canary  Oluwatosin Higginson, MD 10/18/14 1138

## 2014-10-09 NOTE — BH Assessment (Addendum)
Tele Assessment Note   [name] is an 69 y.o. female. Pt was accompanied to [facility] by [employer]. Pt was IVCd after the police department conducted a well check. [employer] was called by [employer] for Welfare which is a program that assisted the Pt with finding a place to live. The  writer was not able to gather information from the Pt due to the Pt's psychotic behavior. The Pt stated throughout the assessment "I love God" "I deny this country" "You (the Probation officer) killed my husband" "Every answer will be no." Careers information officer repeatedly tried to assess the Pt but the Pt would not answer the writer's questions. The writer contacted the [employer] for [employer] but did not reach anyone, a message was left.  Collateral information from EDP: " The Pt presents to the Emergency Department here after being involuntarily committed today. Per nursing note, [employer] called by [employer] for Welfare to check on pt 2 days ago. Police attempted to serve IVC papers Friday but pt refused to come to the door. Police were then successful today with the help of a staff member. Ms. Battenfield was brought to the ED today for psychotic behavior, using the bathroom on the floor, "talking out of her head", visual hallucinations, and threats [place] She denies any pain at this time. Pt with several known allergies to medications as listed below."  Writer consulted with NP Manus Gunning. Per Manus Gunning Pt meets inpatient criteria. No [facility] beds. TTS to seek placement.    Axis I: Bipolar, mixed Axis II: Deferred Axis III:  Past Medical History  Diagnosis Date  . Obesity   . Seizure   . Skin cancer   . Asthma   . GERD (gastroesophageal reflux disease)   . Hypertension   . Tobacco user   . CAD (coronary artery disease)     Moderate disease of the left circumflex managed medically  . Fibromyalgia   . Chronic pain   . Diverticul disease small and large intestine, no perforati or abscess   .  Gastroparesis     abnl gastric emptying scan 2010 (Dr. Deatra Ina)  . Anxiety   . Hyperlipidemia   . Urge and stress incontinence   . Chronic cystitis   . Rectal prolapse   . Neurotic excoriations 01/28/2013  . Leukocytosis     chronic, mild, s/p eval 03/2013 by hematologist --reassured; likely secondary to ongoing smoking  . Type II or unspecified type diabetes mellitus with neurological manifestations, not stated as uncontrolled(250.60) 08/17/2008    Hx of diabetic foot ulcer.  . Bipolar disorder     Psych hosp admission 08/2012--manic episode  . COPD (chronic obstructive pulmonary disease)   . Right knee pain Summer 2014    MRI 04/2013: large popliteal fossa ganglion cyst, mod medial compartment and patellofemoral osteoarth--referred to [facility] at that time (Dr. Berenice Primas)  . Muscle spasm   . DDD (degenerative disc disease), cervical   . DDD (degenerative disc disease), lumbar     MRI 12/2013: right foraminal disc protrusion L4-5, mild facet hypertrophy L5-S1--gets ESI's by Dr. Vira Blanco   Axis IV: housing problems, other psychosocial or environmental problems and problems related to social environment Axis V: 31-40 impairment in reality testing  Past Medical History:  Past Medical History  Diagnosis Date  . Obesity   . Seizure   . Skin cancer   . Asthma   . GERD (gastroesophageal reflux disease)   . Hypertension   .  Tobacco user   . CAD (coronary artery disease)     Moderate disease of the left circumflex managed medically  . Fibromyalgia   . Chronic pain   . Diverticul disease small and large intestine, no perforati or abscess   . Gastroparesis     abnl gastric emptying scan 2010 (Dr. Deatra Ina)  . Anxiety   . Hyperlipidemia   . Urge and stress incontinence   . Chronic cystitis   . Rectal prolapse   . Neurotic excoriations 01/28/2013  . Leukocytosis     chronic, mild, s/p eval 03/2013 by hematologist --reassured; likely secondary to ongoing smoking  . Type II or unspecified  type diabetes mellitus with neurological manifestations, not stated as uncontrolled(250.60) 08/17/2008    Hx of diabetic foot ulcer.  . Bipolar disorder     Psych hosp admission 08/2012--manic episode  . COPD (chronic obstructive pulmonary disease)   . Right knee pain Summer 2014    MRI 04/2013: large popliteal fossa ganglion cyst, mod medial compartment and patellofemoral osteoarth--referred to [facility] at that time (Dr. Berenice Primas)  . Muscle spasm   . DDD (degenerative disc disease), cervical   . DDD (degenerative disc disease), lumbar     MRI 12/2013: right foraminal disc protrusion L4-5, mild facet hypertrophy L5-S1--gets ESI's by Dr. Vira Blanco    Past Surgical History  Procedure Laterality Date  . Appendectomy    . Abdominal hysterectomy    . Tubal ligation    . Vagiocele    . Bladder surgery    . Eye surgery    . Rectocele repair    . Cholecystectomy    . Right foot surgery    . Cosmetic ear surgery    . Anterior cervical decomp/discectomy fusion      C5-C7 levels  . Esophagogastroduodenoscopy  05/2011    Dr. Vashti Hey at [facility]--s/p dilatation, o/w normal exam.  . Colonoscopy w/ biopsies  05/2009    Dr. Frederick Peers (bx showed benign colonic mucosa)  . Colonoscopy w/ biopsies  09/2002    Dr. Frederick Peers (  . Cardiovascular stress test  2002; 12/2008; 03/2012    2002: adenosine myoview NORMAL.  2010-Dr. Crenshaw: NORMAL adenosine myoview, no sign of scar or ischemia, EF normal.  2013-Dr. Nishan: NORMAL Lexiscan stress test, normal LV fxn/EF, normal wall motion  . Cardiac catheterization  08/2007    nonobstructive dz except 75% obstruction in OM, EF 60%--med mgmt    Family History:  Family History  Problem Relation Age of Onset  . Cancer Mother   . Heart attack Sister   . Cancer Sister   . Asthma Sister   . Diabetes Brother   . Heart attack Brother   . Asthma Brother   . Heart disease Brother     Social History:  reports that she has been  smoking Cigarettes.  She has been smoking about 1.50 packs per day. She has never used smokeless tobacco. She reports that she does not drink alcohol or use illicit drugs.  Additional Social History:  Alcohol / Drug Use Pain Medications: Unable to assess Prescriptions: Unable to assess Over the Counter: Unable to assess History of alcohol / drug use?: No history of alcohol / drug abuse Longest period of sobriety (when/how long): Unknown  CIWA: CIWA-Ar BP: 141/70 mmHg Pulse Rate: 109 COWS:    PATIENT STRENGTHS: (choose at least two) Capable of independent living Communication skills  Allergies:  Allergies  Allergen Reactions  . Dust Mite Extract   . Januvia [Sitagliptin  Phosphate] Nausea Only  . Latex   . Metformin     REACTION: Dizziness \\T \ Nausea  . Metoclopramide Hcl   . Penicillins   . Sulfonamide Derivatives     REACTION: Reaction not known    Home Medications:  (Not in a hospital admission)  OB/GYN Status:  No LMP recorded. Patient has had a hysterectomy.  General Assessment Data Location of Assessment: [facility] ACT Assessment: Yes Is this a Tele or Face-to-Face Assessment?: Tele Assessment Is this an Initial Assessment or a Re-assessment for this encounter?: Initial Assessment Living Arrangements: Other (Comment) (Assissted living) Can pt return to current living arrangement?: Yes Admission Status: Involuntary Is patient capable of signing voluntary admission?: No Transfer from: Other (Comment) (Assissted living) Referral Source: Other (Assissted living)     [place] Living Arrangements: Other (Comment) (Assissted living) Name of Psychiatrist: Unknown Name of Therapist: Unknown  Education Status Is patient currently in school?: No Current Grade: NA Highest grade of school patient has completed: [employer] Name of school: NA Contact person: NA  Risk to self with the past 6 months Suicidal Ideation: No (UTA) Suicidal Intent: No (UTA) Is patient  at risk for suicide?: No (UTA) Suicidal Plan?: No (UTA) Access to Means: No (UTA) What has been your use of drugs/alcohol within the last 12 months?: Unknown Previous Attempts/Gestures: No How many times?: 0 Other Self Harm Risks: None (UTA) Triggers for Past Attempts: Unknown Intentional Self Injurious Behavior: None Family Suicide History: Unknown Recent stressful life event(s): Other (Comment) (Unknown) Persecutory voices/beliefs?: Yes Depression: No (UTA) Depression Symptoms: Feeling angry/irritable Substance abuse history and/or treatment for substance abuse?: No Suicide prevention information given to non-admitted patients: Not applicable  Risk to Others within the past 6 months Homicidal Ideation: No (UTA) Thoughts of Harm to Others: No (UTA) Current Homicidal Intent: No (UTA) Current Homicidal Plan: No (UTA) Access to Homicidal Means: No (UTA) Identified Victim: Unknown History of harm to others?: No Assessment of Violence: None Noted Violent Behavior Description: None Does patient have access to weapons?: No Criminal Charges Pending?: No Does patient have a court date: No  Psychosis Hallucinations: Visual Delusions: Persecutory (Believes people are trying to hurt her.)  Mental Status Report Appear/Hygiene: In hospital gown, Disheveled Eye Contact: Poor Motor Activity: Hyperactivity, Restlessness Speech: Incoherent, Argumentative Level of Consciousness: Alert Mood: Angry Affect: Irritable, Angry Anxiety Level: Severe Thought Processes: Tangential Judgement: Impaired Orientation: Place Obsessive Compulsive Thoughts/Behaviors: None  Cognitive Functioning Concentration: Poor Memory: Recent Impaired, Remote Impaired IQ: Average Insight: Poor Impulse Control: Poor Appetite: Fair Weight Loss: 0 Weight Gain: 0 Sleep: Unable to Assess Total Hours of Sleep: 7 Vegetative Symptoms: None  ADLScreening [facility] Assessment Services) Patient's cognitive ability  adequate to safely complete daily activities?: No Patient able to express need for assistance with ADLs?: No Independently performs ADLs?: Yes (appropriate for developmental age)  Prior Inpatient Therapy Prior Inpatient Therapy: Yes Prior Therapy Dates: 2013,2015 Prior Therapy Facilty/Provider(s): [facility] Reason for Treatment: Psychotic behavior  Prior Outpatient Therapy Prior Outpatient Therapy: No (UTA) Prior Therapy Dates: No Prior Therapy Facilty/Provider(s): NA Reason for Treatment: NA  ADL Screening (condition at time of admission) Patient's cognitive ability adequate to safely complete daily activities?: No Is the patient deaf or have difficulty hearing?: No Does the patient have difficulty seeing, even when wearing glasses/contacts?: No Does the patient have difficulty concentrating, remembering, or making decisions?: Yes Patient able to express need for assistance with ADLs?: No Does the patient have difficulty dressing or bathing?: No Independently  performs ADLs?: Yes (appropriate for developmental age) Does the patient have difficulty walking or climbing stairs?: No Weakness of Legs: None Weakness of Arms/Hands: None  Home Assistive Devices/Equipment Home Assistive Devices/Equipment: None  Therapy Consults (therapy consults require a physician order) PT Evaluation Needed: No OT Evalulation Needed: No SLP Evaluation Needed: No Abuse/Neglect Assessment (Assessment to be complete while patient is alone) Physical Abuse: Denies (UTA) Verbal Abuse: Denies (UTA) Sexual Abuse: Denies (UTA) Exploitation of patient/patient's resources: Denies Special educational needs teacher) Self-Neglect: Denies Special educational needs teacher)     Regulatory affairs officer (For Healthcare) Does patient have an advance directive?: No Would patient like information on creating an advanced directive?: No - patient declined information    Additional Information 1:1 In Past 12 Months?: No CIRT Risk: No Elopement Risk: No Does patient have medical  clearance?: Yes     Disposition:  Disposition Initial Assessment Completed for this Encounter: Yes  Darion Juhasz D 10/09/2014 2:46 PM

## 2014-10-09 NOTE — ED Notes (Signed)
[employer] called by [employer] for Welfare check on pt on Friday. Police tried to serve commitment papers on pt Friday but pt would not come to door. They tried again today with help of staff from [place] and pt opened door for them. IVC paperwork served. [facility] brought pt to hospital for medical clearance d/t acute psychotic behavior. Pt hallucinating (talking to people who aren't there), pt wearing only top no pants or underwear when [employer] Staff arrived, pt making statements that she had gun in her apartment and was going to the [place] to kill a bunch of people per [employer]. Staff. In room, pt currently yelling out and states that her "body burns". Red rash noted under pt's R breast.

## 2014-10-10 DIAGNOSIS — F29 Unspecified psychosis not due to a substance or known physiological condition: Secondary | ICD-10-CM | POA: Diagnosis not present

## 2014-10-10 MED ORDER — LORAZEPAM 2 MG/ML IJ SOLN
2.0000 mg | Freq: Once | INTRAMUSCULAR | Status: DC
  Filled 2014-10-10: qty 1

## 2014-10-10 MED ORDER — LORAZEPAM 2 MG/ML IJ SOLN
2.0000 mg | Freq: Once | INTRAMUSCULAR | Status: AC
  Administered 2014-10-10: 2 mg via INTRAMUSCULAR
  Filled 2014-10-10: qty 1

## 2014-10-10 MED ORDER — FLUTICASONE PROPIONATE 0.05 % EX CREA: TOPICAL_CREAM | Freq: Two times a day (BID) | CUTANEOUS | Status: DC

## 2014-10-10 NOTE — ED Notes (Signed)
Patient continues to yell and shout, shaking rails on bed. Asked patient to stop yelling. Patient repeating "I deny my country. I deny my god." Patient then began crying and talking about husband. States she misses husband.

## 2014-10-10 NOTE — ED Notes (Signed)
Went to administer ativan IM and patient had fallen back asleep. Medication held for now as instructed by Dr. Wilson Singer.

## 2014-10-10 NOTE — ED Notes (Signed)
[facility] called stated they denied patient due to the fact that they stated patient requires a higher level of care.

## 2014-10-10 NOTE — ED Notes (Signed)
Gave patient a coke

## 2014-10-10 NOTE — ED Notes (Signed)
Pt given teeth to eat breakfast.

## 2014-10-10 NOTE — [facility] Counselor (Signed)
Per Hope from [facility], patient declined for inpatient admission due to age. Hope sts the facility does not have a Geri Psych facility and recommend [place] placement.

## 2014-10-10 NOTE — ED Notes (Addendum)
Patient yelling out, shouting. Patient then grabbed dentures out of mouth and threw across room onto floor. Dentures secured in denture cup and placed with belongings. Patient grabbing rails on stretcher and shaking them. Dr. Wilson Singer notified, stating is placing order for 2 mg of Ativan IM.

## 2014-10-10 NOTE — ED Notes (Signed)
Patient shouting and yelling again. Dr. Wilson Singer notified.

## 2014-10-11 NOTE — ED Notes (Signed)
[name] requested a copy of pt's vitals through the night. A copy of vitals were sent to fax number [phone].

## 2014-10-11 NOTE — ED Notes (Signed)
Night nurse received call, [place] to accept patient, [facility] and EDP notified.

## 2014-10-11 NOTE — ED Provider Notes (Signed)
Patient accepted to [facility], awaiting transport.  Filed Vitals:   10/11/14 0620  BP: 111/47  Pulse: 81  Temp:   Resp: 17  '  Orpah Greek, MD 10/11/14 [phone]

## 2014-10-11 NOTE — ED Notes (Signed)
[employer] here to transport patient.

## 2014-10-11 NOTE — ED Provider Notes (Deleted)
Patient accepted at [facility]. No overnight problems.   Filed Vitals:   10/11/14 0754  BP: 147/56  Pulse: 87  Temp: 97.6 F (36.4 C)  Resp: 18     Orpah Greek, MD 10/11/14 [phone]

## 2014-10-11 NOTE — ED Notes (Signed)
Pt.'s belonging from locker and two rings kept by security was given to [employer] for transport.

## 2014-10-17 ENCOUNTER — Encounter: Payer: Self-pay | Admitting: Family Medicine

## 2014-10-17 ENCOUNTER — Other Ambulatory Visit: Payer: Self-pay | Admitting: Family Medicine

## 2014-10-17 ENCOUNTER — Other Ambulatory Visit (HOSPITAL_COMMUNITY): Payer: Self-pay | Admitting: Psychiatry

## 2014-10-17 NOTE — Telephone Encounter (Signed)
Gabapentin refill denied as patient is not an open patient to behavioral health outpatient services.

## 2014-10-17 NOTE — Telephone Encounter (Signed)
Please advise 

## 2014-12-08 ENCOUNTER — Other Ambulatory Visit: Payer: Self-pay | Admitting: Family Medicine

## 2014-12-17 ENCOUNTER — Other Ambulatory Visit: Payer: Self-pay | Admitting: Family Medicine

## 2014-12-23 ENCOUNTER — Other Ambulatory Visit: Payer: Self-pay | Admitting: Family Medicine

## 2014-12-23 NOTE — Telephone Encounter (Signed)
Pt LMOM for [place] to send in Rx for diabetic shoes for pt and also a new inhaler for her.  Per Dr. Anitra Lauth, we can not do that since she is unable to come into the office.  I left detailed message on pt's machine letting her know that we are truly sorry but we are unable to prescribe these medications at this time.

## 2015-01-02 ENCOUNTER — Other Ambulatory Visit: Payer: Self-pay | Admitting: Family Medicine

## 2015-01-02 NOTE — Telephone Encounter (Signed)
I know pt is going to have to est elsewhere due to her insurance but can you still Rx these or no?  Please advise.

## 2015-01-04 NOTE — Telephone Encounter (Signed)
Will do a 3 mo supply of each of these and this has to be the last time I rx these meds for her.-thx

## 2015-01-23 ENCOUNTER — Other Ambulatory Visit: Payer: Self-pay | Admitting: Family Medicine

## 2015-02-07 ENCOUNTER — Other Ambulatory Visit: Payer: Self-pay | Admitting: Family Medicine

## 2015-03-03 ENCOUNTER — Other Ambulatory Visit: Payer: Self-pay | Admitting: Family Medicine

## 2015-04-13 ENCOUNTER — Encounter: Payer: Self-pay | Admitting: Gastroenterology

## 2015-06-06 ENCOUNTER — Other Ambulatory Visit: Payer: Self-pay | Admitting: Family Medicine

## 2015-06-11 ENCOUNTER — Other Ambulatory Visit: Payer: Self-pay | Admitting: Family Medicine

## 2015-06-12 ENCOUNTER — Other Ambulatory Visit: Payer: Self-pay | Admitting: Family Medicine

## 2015-06-16 ENCOUNTER — Observation Stay (HOSPITAL_COMMUNITY): Payer: Medicare Other

## 2015-06-16 ENCOUNTER — Emergency Department (HOSPITAL_COMMUNITY): Payer: Medicare Other

## 2015-06-16 ENCOUNTER — Encounter (HOSPITAL_COMMUNITY): Payer: Self-pay | Admitting: *Deleted

## 2015-06-16 ENCOUNTER — Observation Stay (HOSPITAL_COMMUNITY)
Admission: EM | Admit: 2015-06-16 | Discharge: 2015-06-19 | Disposition: A | Discharge status: Home / Self Care | Payer: Medicare Other | Attending: Family Medicine | Admitting: Family Medicine

## 2015-06-16 DIAGNOSIS — Z7951 Long term (current) use of inhaled steroids: Secondary | ICD-10-CM | POA: Diagnosis not present

## 2015-06-16 DIAGNOSIS — K219 Gastro-esophageal reflux disease without esophagitis: Secondary | ICD-10-CM | POA: Diagnosis not present

## 2015-06-16 DIAGNOSIS — E669 Obesity, unspecified: Secondary | ICD-10-CM | POA: Diagnosis not present

## 2015-06-16 DIAGNOSIS — K623 Rectal prolapse: Secondary | ICD-10-CM | POA: Diagnosis not present

## 2015-06-16 DIAGNOSIS — I2510 Atherosclerotic heart disease of native coronary artery without angina pectoris: Secondary | ICD-10-CM | POA: Diagnosis not present

## 2015-06-16 DIAGNOSIS — N3020 Other chronic cystitis without hematuria: Secondary | ICD-10-CM | POA: Diagnosis not present

## 2015-06-16 DIAGNOSIS — K3184 Gastroparesis: Secondary | ICD-10-CM | POA: Diagnosis not present

## 2015-06-16 DIAGNOSIS — Z79899 Other long term (current) drug therapy: Secondary | ICD-10-CM | POA: Insufficient documentation

## 2015-06-16 DIAGNOSIS — R000 Tachycardia, unspecified: Secondary | ICD-10-CM | POA: Diagnosis not present

## 2015-06-16 DIAGNOSIS — M5136 Other intervertebral disc degeneration, lumbar region: Secondary | ICD-10-CM | POA: Insufficient documentation

## 2015-06-16 DIAGNOSIS — E876 Hypokalemia: Secondary | ICD-10-CM | POA: Diagnosis present

## 2015-06-16 DIAGNOSIS — F3160 Bipolar disorder, current episode mixed, unspecified: Secondary | ICD-10-CM | POA: Diagnosis not present

## 2015-06-16 DIAGNOSIS — Z85828 Personal history of other malignant neoplasm of skin: Secondary | ICD-10-CM | POA: Diagnosis not present

## 2015-06-16 DIAGNOSIS — G9340 Encephalopathy, unspecified: Secondary | ICD-10-CM | POA: Diagnosis not present

## 2015-06-16 DIAGNOSIS — M797 Fibromyalgia: Secondary | ICD-10-CM | POA: Diagnosis not present

## 2015-06-16 DIAGNOSIS — Z720 Tobacco use: Secondary | ICD-10-CM | POA: Insufficient documentation

## 2015-06-16 DIAGNOSIS — R4182 Altered mental status, unspecified: Principal | ICD-10-CM | POA: Insufficient documentation

## 2015-06-16 DIAGNOSIS — G8929 Other chronic pain: Secondary | ICD-10-CM | POA: Diagnosis not present

## 2015-06-16 DIAGNOSIS — M62838 Other muscle spasm: Secondary | ICD-10-CM | POA: Diagnosis not present

## 2015-06-16 DIAGNOSIS — Z23 Encounter for immunization: Secondary | ICD-10-CM | POA: Diagnosis not present

## 2015-06-16 DIAGNOSIS — I10 Essential (primary) hypertension: Secondary | ICD-10-CM | POA: Diagnosis not present

## 2015-06-16 DIAGNOSIS — N3946 Mixed incontinence: Secondary | ICD-10-CM | POA: Insufficient documentation

## 2015-06-16 DIAGNOSIS — Z880 Allergy status to penicillin: Secondary | ICD-10-CM | POA: Insufficient documentation

## 2015-06-16 DIAGNOSIS — E119 Type 2 diabetes mellitus without complications: Secondary | ICD-10-CM | POA: Diagnosis not present

## 2015-06-16 DIAGNOSIS — Z7982 Long term (current) use of aspirin: Secondary | ICD-10-CM | POA: Diagnosis not present

## 2015-06-16 DIAGNOSIS — J449 Chronic obstructive pulmonary disease, unspecified: Secondary | ICD-10-CM | POA: Diagnosis present

## 2015-06-16 DIAGNOSIS — Z91040 Latex allergy status: Secondary | ICD-10-CM | POA: Diagnosis not present

## 2015-06-16 DIAGNOSIS — E785 Hyperlipidemia, unspecified: Secondary | ICD-10-CM | POA: Diagnosis not present

## 2015-06-16 DIAGNOSIS — R443 Hallucinations, unspecified: Secondary | ICD-10-CM

## 2015-06-16 DIAGNOSIS — R109 Unspecified abdominal pain: Secondary | ICD-10-CM | POA: Diagnosis not present

## 2015-06-16 DIAGNOSIS — F29 Unspecified psychosis not due to a substance or known physiological condition: Secondary | ICD-10-CM

## 2015-06-16 DIAGNOSIS — Z008 Encounter for other general examination: Secondary | ICD-10-CM | POA: Diagnosis present

## 2015-06-16 DIAGNOSIS — F319 Bipolar disorder, unspecified: Secondary | ICD-10-CM | POA: Diagnosis not present

## 2015-06-16 DIAGNOSIS — R110 Nausea: Secondary | ICD-10-CM | POA: Insufficient documentation

## 2015-06-16 DIAGNOSIS — F3162 Bipolar disorder, current episode mixed, moderate: Secondary | ICD-10-CM | POA: Diagnosis present

## 2015-06-16 LAB — CBC WITH DIFFERENTIAL/PLATELET
Basophils Absolute: 0.1 K/uL (ref 0.0–0.1)
Basophils Relative: 1 %
Eosinophils Absolute: 0.1 K/uL (ref 0.0–0.7)
Eosinophils Relative: 1 %
HCT: 46.4 % — ABNORMAL HIGH (ref 36.0–46.0)
Hemoglobin: 16 g/dL — ABNORMAL HIGH (ref 12.0–15.0)
Lymphocytes Relative: 19 %
Lymphs Abs: 2.1 K/uL (ref 0.7–4.0)
MCH: 29.2 pg (ref 26.0–34.0)
MCHC: 34.5 g/dL (ref 30.0–36.0)
MCV: 84.7 fL (ref 78.0–100.0)
Monocytes Absolute: 0.5 K/uL (ref 0.1–1.0)
Monocytes Relative: 4 %
Neutro Abs: 8.7 K/uL — ABNORMAL HIGH (ref 1.7–7.7)
Neutrophils Relative %: 75 %
Platelets: 346 K/uL (ref 150–400)
RBC: 5.48 MIL/uL — ABNORMAL HIGH (ref 3.87–5.11)
RDW: 15.2 % (ref 11.5–15.5)
WBC: 11.5 K/uL — ABNORMAL HIGH (ref 4.0–10.5)

## 2015-06-16 LAB — COMPREHENSIVE METABOLIC PANEL WITH GFR
ALT: 13 U/L — ABNORMAL LOW (ref 14–54)
AST: 19 U/L (ref 15–41)
Albumin: 3.6 g/dL (ref 3.5–5.0)
Alkaline Phosphatase: 74 U/L (ref 38–126)
Anion gap: 10 (ref 5–15)
BUN: 7 mg/dL (ref 6–20)
CO2: 27 mmol/L (ref 22–32)
Calcium: 9.1 mg/dL (ref 8.9–10.3)
Chloride: 101 mmol/L (ref 101–111)
Creatinine, Ser: 0.67 mg/dL (ref 0.44–1.00)
GFR calc Af Amer: >60 mL/min
GFR calc non Af Amer: >60 mL/min
Glucose, Bld: 109 mg/dL — ABNORMAL HIGH (ref 65–99)
Potassium: 2.7 mmol/L — CL (ref 3.5–5.1)
Sodium: 138 mmol/L (ref 135–145)
Total Bilirubin: 0.7 mg/dL (ref 0.3–1.2)
Total Protein: 7.3 g/dL (ref 6.5–8.1)

## 2015-06-16 LAB — BLOOD GAS, ARTERIAL
ACID-BASE EXCESS: 0.2 mmol/L (ref 0.0–2.0)
Bicarbonate: 24.6 mEq/L — ABNORMAL HIGH (ref 20.0–24.0)
FIO2: 21
O2 Saturation: 88.9 %
PCO2 ART: 37.6 mmHg (ref 35.0–45.0)
PH ART: 7.423 (ref 7.350–7.450)
Patient temperature: 37
TCO2: 16.5 mmol/L (ref 0–100)
pO2, Arterial: 54.4 mmHg — ABNORMAL LOW (ref 80.0–100.0)

## 2015-06-16 LAB — SALICYLATE LEVEL: Salicylate Lvl: <4 mg/dL (ref 2.8–30.0)

## 2015-06-16 LAB — URINALYSIS, ROUTINE W REFLEX MICROSCOPIC
Bilirubin Urine: NEGATIVE
Glucose, UA: NEGATIVE mg/dL
Hgb urine dipstick: NEGATIVE
Ketones, ur: NEGATIVE mg/dL
Leukocytes, UA: NEGATIVE
Nitrite: NEGATIVE
Protein, ur: NEGATIVE mg/dL
Specific Gravity, Urine: 1.01 (ref 1.005–1.030)
Urobilinogen, UA: 0.2 mg/dL (ref 0.0–1.0)
pH: 7 (ref 5.0–8.0)

## 2015-06-16 LAB — RAPID URINE DRUG SCREEN, HOSP PERFORMED
AMPHETAMINES: NOT DETECTED
BARBITURATES: NOT DETECTED
Benzodiazepines: POSITIVE — AB
COCAINE: NOT DETECTED
OPIATES: POSITIVE — AB
TETRAHYDROCANNABINOL: NOT DETECTED

## 2015-06-16 LAB — ETHANOL: Alcohol, Ethyl (B): <5 mg/dL

## 2015-06-16 LAB — ACETAMINOPHEN LEVEL: Acetaminophen (Tylenol), Serum: <10 ug/mL — ABNORMAL LOW (ref 10–30)

## 2015-06-16 MED ORDER — SODIUM CHLORIDE 0.9 % IJ SOLN: 3.0000 mL | INTRAMUSCULAR | Status: DC | PRN

## 2015-06-16 MED ORDER — SODIUM CHLORIDE 0.9 % IV SOLN: 250.0000 mL | INTRAVENOUS | Status: DC | PRN

## 2015-06-16 MED ORDER — IOHEXOL 300 MG/ML  SOLN
100.0000 mL | Freq: Once | INTRAMUSCULAR | Status: AC | PRN
  Administered 2015-06-16: 100 mL via INTRAVENOUS

## 2015-06-16 MED ORDER — SODIUM CHLORIDE 0.9 % IJ SOLN
3.0000 mL | Freq: Two times a day (BID) | INTRAMUSCULAR | Status: DC
  Administered 2015-06-16 – 2015-06-19 (×3): 3 mL via INTRAVENOUS

## 2015-06-16 MED ORDER — IOHEXOL 300 MG/ML  SOLN: 25.0000 mL | Freq: Once | INTRAMUSCULAR | Status: DC | PRN

## 2015-06-16 MED ORDER — SODIUM CHLORIDE 0.9 % IV BOLUS (SEPSIS)
1000.0000 mL | Freq: Once | INTRAVENOUS | Status: AC
  Administered 2015-06-16: 1000 mL via INTRAVENOUS

## 2015-06-16 MED ORDER — POTASSIUM CHLORIDE 10 MEQ/100ML IV SOLN
10.0000 meq | INTRAVENOUS | Status: AC
  Administered 2015-06-16 (×4): 10 meq via INTRAVENOUS
  Filled 2015-06-16 (×2): qty 100

## 2015-06-16 MED ORDER — GABAPENTIN 100 MG PO CAPS
100.0000 mg | ORAL_CAPSULE | Freq: Every day | ORAL | Status: DC
  Administered 2015-06-17 – 2015-06-18 (×3): 100 mg via ORAL
  Filled 2015-06-16 (×3): qty 1

## 2015-06-16 MED ORDER — POTASSIUM CHLORIDE 10 MEQ/100ML IV SOLN
10.0000 meq | INTRAVENOUS | Status: AC
  Administered 2015-06-16 (×3): 10 meq via INTRAVENOUS
  Filled 2015-06-16 (×3): qty 100

## 2015-06-16 MED ORDER — IOHEXOL 300 MG/ML  SOLN
50.0000 mL | Freq: Once | INTRAMUSCULAR | Status: AC | PRN
  Administered 2015-06-16: 50 mL via ORAL

## 2015-06-16 MED ORDER — SODIUM CHLORIDE 0.9 % IJ SOLN
3.0000 mL | Freq: Two times a day (BID) | INTRAMUSCULAR | Status: DC
  Administered 2015-06-16 – 2015-06-19 (×5): 3 mL via INTRAVENOUS

## 2015-06-16 NOTE — ED Notes (Signed)
Pt arrived by EMS escorted by [employer]. States pt was found walking down the middle of the road & wanted to come to the hospital. Per EMS pt says she is hearing voices telling her to jump.

## 2015-06-16 NOTE — ED Notes (Signed)
Patient placed in gown and placed on cardiac monitor.Patient's purse was in plastic bag along with a two liter ginger ale brought in by [employer]. Patient had 20 bill in bra, placed money in red purse with her other belongings. Made Ron RN aware of the money and what I did with it. Placed patient sticker on bag and given to nurse.

## 2015-06-16 NOTE — ED Notes (Addendum)
CRITICAL VALUE ALERT  Critical value received:  Potassium 2.7  Date of notification:  06/16/15  Time of notification:  0620  Critical value read back:Yes.    Nurse who received alert:  Derek Mound, RN  MD notified (1st page):  Mesner  Time of first page:  0620  MD notified (2nd page):  Time of second page:  Responding MD:  Mesner  Time MD responded:  [phone]

## 2015-06-16 NOTE — H&P (Signed)
History and Physical  [name] NUU:[id number] DOB: 16-May-1946 DOA: 06/16/2015  Referring physician: Merrily Pew, MD PCP: No PCP Per Patient   Chief Complaint: confused  HPI:  69 yo female with PMH of bipolar disorder with psychosis with multiple other medical disorders including COPD, presented after being picked up by police for wandering behavior and reportedly with auditory hallucinations. In the ED found to be hypokalemic and with acute encephalopathy. She was referred for admission.   Pt is currently encephalopathic, yet follows simple commands. Patient unable to give history. All history obtained from chart--picked up by police walking down middle of road. Reported hallucinations telling her to jump, possibly to harm self.  RN reports patient was found to have multiple unmarked pills in bra.  In the emergency department afebrile, VSS, not hypoxic Pertinent labs: Potassium 2.7, BMP unremarkable, WBC 11.5, Hgb 34.7, Tylenol salicylate level and alcohol negative EKG: Independently reviewed. Normal SR, no acute changes Imaging: independently reviewed. CT abdomen/pelvis, no acute abnormalities    Review of Systems:  Unobtainable due to encephalopathic status.     Past Medical History  Diagnosis Date  . Obesity   . Seizure ([place])   . Skin cancer   . Asthma   . GERD (gastroesophageal reflux disease)   . Hypertension   . Tobacco user   . CAD (coronary artery disease)     Moderate disease of the left circumflex managed medically  . Fibromyalgia   . Chronic pain   . Diverticul disease small and large intestine, no perforati or abscess   . Gastroparesis     abnl gastric emptying scan 2010 (Dr. Deatra Ina)  . Anxiety   . Hyperlipidemia   . Urge and stress incontinence   . Chronic cystitis   . Rectal prolapse   . Neurotic excoriations 01/28/2013  . Leukocytosis     chronic, mild, s/p eval 03/2013 by hematologist --reassured; likely secondary to ongoing smoking  . Type II  or unspecified type diabetes mellitus with neurological manifestations, not stated as uncontrolled(250.60) 08/17/2008    Hx of diabetic foot ulcer.  . Bipolar disorder ([place])     Psych hosp admission 08/2012--manic episode.  Involuntary committment 10/12/14-manic/psychotic.  Marland Kitchen COPD (chronic obstructive pulmonary disease) ([place])   . Right knee pain Summer 2014    MRI 04/2013: large popliteal fossa ganglion cyst, mod medial compartment and patellofemoral osteoarth--referred to [facility] at that time (Dr. Berenice Primas)  . Muscle spasm   . DDD (degenerative disc disease), cervical   . DDD (degenerative disc disease), lumbar     MRI 12/2013: right foraminal disc protrusion L4-5, mild facet hypertrophy L5-S1--gets ESI's by Dr. Vira Blanco    Past Surgical History  Procedure Laterality Date  . Appendectomy    . Abdominal hysterectomy    . Tubal ligation    . Vagiocele    . Bladder surgery    . Eye surgery    . Rectocele repair    . Cholecystectomy    . Right foot surgery    . Cosmetic ear surgery    . Anterior cervical decomp/discectomy fusion      C5-C7 levels  . Esophagogastroduodenoscopy  05/2011    Dr. Vashti Hey at [facility]--s/p dilatation, o/w normal exam.  . Colonoscopy w/ biopsies  05/2009    Dr. Frederick Peers (bx showed benign colonic mucosa)  . Colonoscopy w/ biopsies  09/2002    Dr. Frederick Peers (  . Cardiovascular stress test  2002; 12/2008; 03/2012    2002: adenosine myoview NORMAL.  2010-Dr. Crenshaw: NORMAL adenosine myoview, no sign of scar or ischemia, EF normal.  2013-Dr. Nishan: NORMAL Lexiscan stress test, normal LV fxn/EF, normal wall motion  . Cardiac catheterization  08/2007    nonobstructive dz except 75% obstruction in OM, EF 60%--med mgmt    Social History:  reports that she has been smoking Cigarettes.  She has been smoking about 1.50 packs per day. She has never used smokeless tobacco. She reports that she does not drink alcohol or use illicit  drugs.  Unable to obtain.   Allergies  Allergen Reactions  . Dust Mite Extract   . Januvia [Sitagliptin Phosphate] Nausea Only  . Latex   . Metformin     REACTION: Dizziness \\T \ Nausea  . Metoclopramide Hcl   . Penicillins   . Sulfonamide Derivatives     REACTION: Reaction not known    Family History  Problem Relation Age of Onset  . Cancer Mother   . Heart attack Sister   . Cancer Sister   . Asthma Sister   . Diabetes Brother   . Heart attack Brother   . Asthma Brother   . Heart disease Brother      Prior to Admission medications   Medication Sig Start Date End Date Taking? Authorizing Provider  albuterol (PROVENTIL) (2.5 MG/3ML) 0.083% nebulizer solution Take 3 mLs (2.5 mg total) by nebulization every 6 (six) hours as needed for wheezing. 07/25/14  Yes Kerrie Buffalo, NP  albuterol-ipratropium (COMBIVENT) 18-103 MCG/ACT inhaler Inhale 2 puffs into the lungs every 6 (six) hours as needed for wheezing. 03/28/14  Yes Tammi Sou, MD  amLODipine (NORVASC) 10 MG tablet TAKE ONE TABLET BY MOUTH DAILY FOR HIGH BLOOD PRESSURE. 09/22/14  Yes Tammi Sou, MD  aspirin 81 MG EC tablet Take 1 tablet (81 mg total) by mouth daily. For platelette aggregation. 07/25/14  Yes Kerrie Buffalo, NP  benazepril (LOTENSIN) 20 MG tablet TAKE ONE TABLET BY MOUTH DAILY FOR HIGH BLOOD PRESSURE. 09/22/14  Yes Tammi Sou, MD  benztropine (COGENTIN) 1 MG tablet Take 1 tablet (1 mg total) by mouth 2 (two) times daily as needed for tremors. 07/25/14  Yes Kerrie Buffalo, NP  carvedilol (COREG) 12.5 MG tablet TAKE (1/2) TABLET BY MOUTH TWICE DAILY. 01/23/15  Yes Tammi Sou, MD  COMBIVENT RESPIMAT 20-100 MCG/ACT AERS respimat INHALE 1 PUFF INTO THE LUNGS EVERY 6 HOURS AS NEEDED FOR WHEEZING. 12/09/14  Yes Tammi Sou, MD  diclofenac sodium (VOLTAREN) 1 % GEL Apply 2g to affected joint up to 4 times daily as needed 08/30/14  Yes Tammi Sou, MD  DULoxetine (CYMBALTA) 60 MG capsule TAKE  ONE CAPSULE BY MOUTH DAILY FOR ANXIETY AND DEPRESSION. 01/04/15  Yes Tammi Sou, MD  estradiol (ESTRACE) 0.1 MG/GM vaginal cream 8.72 applicators full qd 76/18/48  Yes Kerrie Buffalo, NP  fexofenadine (ALLEGRA) 60 MG tablet 1 tab po bid prn for nasal allergies 08/17/14  Yes Tammi Sou, MD  fish oil-omega-3 fatty acids 1000 MG capsule Take 2 capsules (2 g total) by mouth daily. For lipid control. 08/15/12  Yes Neil T Mashburn, PA-C  fluticasone (CUTIVATE) 0.05 % cream APPLY TO AFFECTED AREAS ON SKIN TWICE DAILY AS NEEDED. 12/19/14  Yes Tammi Sou, MD  gabapentin (NEURONTIN) 100 MG capsule TAKE (1) CAPSULE BY MOUTH AT BEDTIME. 10/17/14  Yes Tammi Sou, MD  glimepiride (AMARYL) 2 MG tablet TAKE 1 TABLET BY MOUTH EVERY MORNING FOR DIABETES. 01/04/15  Yes Adrian Blackwater McGowen,  MD  glycopyrrolate (ROBINUL) 2 MG tablet Take 1 tablet (2 mg total) by mouth 3 (three) times daily as needed. 07/25/14  Yes Kerrie Buffalo, NP  lamoTRIgine (LAMICTAL) 25 MG tablet TAKE 1 TABLET BY MOUTH TWICE A DAY. 10/18/14  Yes Tammi Sou, MD  lidocaine (LIDODERM) 5 % Place 3 patches onto the skin 2 (two) times daily. Remove & Discard patch within 12 hours or as directed by MD 07/25/14  Yes Kerrie Buffalo, NP  methocarbamol (ROBAXIN) 500 MG tablet Take 1 tablet (500 mg total) by mouth 3 (three) times daily as needed. FOR MUSCLE SPASMS 07/25/14  Yes Kerrie Buffalo, NP  metoprolol succinate (TOPROL-XL) 50 MG 24 hr tablet TAKE ONE TABLET BY MOUTH EVERY DAY WITH OR IMMEDIATELY FOLLOWING A MEAL. 09/22/14  Yes Tammi Sou, MD  nitroGLYCERIN (NITROSTAT) 0.4 MG SL tablet Place 1 tablet (0.4 mg total) under the tongue every 5 (five) minutes as needed for chest pain. 07/25/14  Yes Kerrie Buffalo, NP  OLANZapine zydis (ZYPREXA) 5 MG disintegrating tablet Take 1 tablet (5 mg total) by mouth every 8 (eight) hours as needed (agitation / psychosis). 07/25/14  Yes Kerrie Buffalo, NP  omeprazole (PRILOSEC) 20 MG capsule TAKE  (1) CAPSULE BY MOUTH TWICE DAILY. 09/22/14  Yes Tammi Sou, MD  pravastatin (PRAVACHOL) 80 MG tablet Take 1 tablet (80 mg total) by mouth daily. 07/25/14  Yes Kerrie Buffalo, NP  triamcinolone (NASACORT AQ) 55 MCG/ACT AERO nasal inhaler Place 2 sprays into the nose daily. 07/25/14  Yes Kerrie Buffalo, NP   Physical Exam: Filed Vitals:   06/16/15 0508 06/16/15 0630 06/16/15 0730  BP: 140/70 136/62 131/67  Pulse: 104 89 88  Temp: 97.7 F (36.5 C)    TempSrc: Oral    Resp: 18 23 22   Weight: 68.04 kg (150 lb)    SpO2: 94% 89% 91%    Afebrile, VSS, not hypoxic General:  Somnolent but arouses to voice. Eyes: PERRL, normal lids, irises ENT: grossly normal hearing, lips, tongue Neck: no LAD, masses, thyromegaly  Cardiovascular: Regular rate and rhythm, no murmur, rub or gallop. No lower extremity edema. Respiratory: Clear to auscultation bilaterally, no wheezes, rales or rhonchi. Normal respiratory effort. Abdomen: soft, ntnd Skin: no rash or induration  Musculoskeletal: grossly normal tone bilateral upper and lower extremities. Follows simple commands. Psychiatric: unobtainable Neurologic: grossly non-focal. CN appear intact. Moves all extremities to command.  Wt Readings from Last 3 Encounters:  06/16/15 68.04 kg (150 lb)  08/17/14 68.04 kg (150 lb)  07/17/14 70.444 kg (155 lb 4.8 oz)    Labs on Admission:  Basic Metabolic Panel:  Recent Labs Lab 06/16/15 0531  NA 138  K 2.7*  CL 101  CO2 27  GLUCOSE 109*  BUN 7  CREATININE 0.67  CALCIUM 9.1    Liver Function Tests:  Recent Labs Lab 06/16/15 0531  AST 19  ALT 13*  ALKPHOS 74  BILITOT 0.7  PROT 7.3  ALBUMIN 3.6   CBC:  Recent Labs Lab 06/16/15 0531  WBC 11.5*  NEUTROABS 8.7*  HGB 16.0*  HCT 46.4*  MCV 84.7  PLT 346     Radiological Exams on Admission: Ct Abdomen Pelvis W Contrast  06/16/2015  CLINICAL DATA:  Diffuse abdominal pain and tenderness.  Lethargy. EXAM: CT ABDOMEN AND PELVIS  WITH CONTRAST TECHNIQUE: Multidetector CT imaging of the abdomen and pelvis was performed using the standard protocol following bolus administration of intravenous contrast. CONTRAST:  118mL OMNIPAQUE IOHEXOL 300 MG/ML  SOLN  COMPARISON:  11/16/2009 FINDINGS: Lower chest:  No acute findings. Hepatobiliary: No masses identified. Focal fatty infiltration again seen adjacent falciform ligament and gallbladder fossa. Prior cholecystectomy noted biliary ductal dilatation remains stable. Pancreas: No mass, inflammatory changes, or other significant abnormality. Spleen: Within normal limits in size and appearance. Adrenals/Urinary Tract: No masses identified. No evidence of hydronephrosis. Stomach/Bowel: No evidence of obstruction, inflammatory process, or abnormal fluid collections. Sigmoid diverticulosis is demonstrated, however there is no evidence of diverticulitis. Vascular/Lymphatic: No pathologically enlarged lymph nodes. No evidence of abdominal aortic aneurysm. Reproductive: Prior hysterectomy noted. Adnexal regions are unremarkable in appearance. Other: None. Musculoskeletal: No suspicious bone lesions identified. Old mild L1 and L5 vertebral body compression deformities again noted. IMPRESSION: Sigmoid diverticulosis. No radiographic evidence of diverticulitis or other acute findings. Electronically Signed   By: Earle Gell M.D.   On: 06/16/2015 07:45      Principal Problem:   Acute encephalopathy Active Problems:   Bipolar disorder (HCC)   COPD (chronic obstructive pulmonary disease) (HCC)   Hypokalemia   Psychosis   Hallucination   Assessment/Plan 1. Acute encephalopathy. Suspect multifactorial including polypharmacy induced and psychosis. UDS positive for BZD and opiates. Chronic meds include:  Cogentin, Cymbalta, gabapentin, lamotrigine, Percocet. No BZD listed. Tylenol, salicylate level and alcohol levels negative. CT head negative. No focal neurological deficits. ABG 7.42/37/54. Currently  protecting airway. 2. Reported hallucinations, suspect acute psychosis superimposed on Bipolar disorder. 3. Hypokalemia.  4. No abd pain on exam and CT abd/pelvis reassuring. 5. COPD with hypoxemia without hypoxia. ABG noted. 6. DM Type 2, with random Blood sugar 109, Anion gap 10   Patient presents after being found wandering. Initial evaluation reassuring, VSS and is protecting airway.  Plan potassium repletion, monitor on telemetry, and supportive care. Withhold  medications until mental status improves then will consult psychiatry.   Will plan obs to tele bed.  Code Status: Full  DVT prophylaxis:SCDs Family discussion: Discussed plan in detail. No further concerns at this time. Disposition Plan/Anticipated LOS: will transfer to medical bed  Time spent: 55 minutes  Murray Hodgkins, MD  [facility] Pager [phone] 06/16/2015, 8:18 AM  By signing my name below, I, Rhett Bannister attest that this documentation has been prepared under the direction and in the presence of Kathie Dike, MD  Electronically signed: Rhett Bannister  06/16/2015 8:35 AM   I personally performed the services described in this documentation. All medical record entries made by the scribe were at my direction. I have reviewed the chart and agree that the record reflects my personal performance and is accurate and complete. Murray Hodgkins, MD

## 2015-06-16 NOTE — ED Notes (Signed)
Pt sleeping soundly.  Can be awoken to verbal stimuli but falls back asleep.

## 2015-06-16 NOTE — ED Notes (Signed)
Dr. Sarajane Jews at bedside to evaluate patient.  Would like CT head and ABG done and resulted prior to moving patient to floor.  If results are abnormal pt may warrant stepdown admission and bed may be changed.

## 2015-06-16 NOTE — ED Notes (Signed)
MD at bedside. Patient very lethargic and not able to stay woke to answer EDP questions. Patient speech slow and rambling conversation.

## 2015-06-16 NOTE — ED Provider Notes (Signed)
CSN: [id number]     Arrival date & time 06/16/15  0458 History   First MD Initiated Contact with Patient 06/16/15 [phone]     Chief Complaint  Patient presents with  . V70.1     (Consider location/radiation/quality/duration/timing/severity/associated sxs/prior Treatment) Patient is a 69 y.o. female presenting with abdominal pain.  Abdominal Pain Pain location:  Generalized Pain radiates to:  Does not radiate Pain severity:  Mild Onset quality:  Unable to specify Timing:  Unable to specify Associated symptoms: nausea   Associated symptoms: no vomiting     Past Medical History  Diagnosis Date  . Obesity   . Seizure ([place])   . Skin cancer   . Asthma   . GERD (gastroesophageal reflux disease)   . Hypertension   . Tobacco user   . CAD (coronary artery disease)     Moderate disease of the left circumflex managed medically  . Fibromyalgia   . Chronic pain   . Diverticul disease small and large intestine, no perforati or abscess   . Gastroparesis     abnl gastric emptying scan 2010 (Dr. Deatra Ina)  . Anxiety   . Hyperlipidemia   . Urge and stress incontinence   . Chronic cystitis   . Rectal prolapse   . Neurotic excoriations 01/28/2013  . Leukocytosis     chronic, mild, s/p eval 03/2013 by hematologist --reassured; likely secondary to ongoing smoking  . Type II or unspecified type diabetes mellitus with neurological manifestations, not stated as uncontrolled(250.60) 08/17/2008    Hx of diabetic foot ulcer.  . Bipolar disorder ([place])     Psych hosp admission 08/2012--manic episode.  Involuntary committment 10/12/14-manic/psychotic.  Marland Kitchen COPD (chronic obstructive pulmonary disease) ([place])   . Right knee pain Summer 2014    MRI 04/2013: large popliteal fossa ganglion cyst, mod medial compartment and patellofemoral osteoarth--referred to [facility] at that time (Dr. Berenice Primas)  . Muscle spasm   . DDD (degenerative disc disease), cervical   . DDD (degenerative disc disease), lumbar      MRI 12/2013: right foraminal disc protrusion L4-5, mild facet hypertrophy L5-S1--gets ESI's by Dr. Vira Blanco   Past Surgical History  Procedure Laterality Date  . Appendectomy    . Abdominal hysterectomy    . Tubal ligation    . Vagiocele    . Bladder surgery    . Eye surgery    . Rectocele repair    . Cholecystectomy    . Right foot surgery    . Cosmetic ear surgery    . Anterior cervical decomp/discectomy fusion      C5-C7 levels  . Esophagogastroduodenoscopy  05/2011    Dr. Vashti Hey at [facility]--s/p dilatation, o/w normal exam.  . Colonoscopy w/ biopsies  05/2009    Dr. Frederick Peers (bx showed benign colonic mucosa)  . Colonoscopy w/ biopsies  09/2002    Dr. Frederick Peers (  . Cardiovascular stress test  2002; 12/2008; 03/2012    2002: adenosine myoview NORMAL.  2010-Dr. Crenshaw: NORMAL adenosine myoview, no sign of scar or ischemia, EF normal.  2013-Dr. Nishan: NORMAL Lexiscan stress test, normal LV fxn/EF, normal wall motion  . Cardiac catheterization  08/2007    nonobstructive dz except 75% obstruction in OM, EF 60%--med mgmt   Family History  Problem Relation Age of Onset  . Cancer Mother   . Heart attack Sister   . Cancer Sister   . Asthma Sister   . Diabetes Brother   . Heart attack Brother   . Asthma Brother   .  Heart disease Brother    Social History  Substance Use Topics  . Smoking status: Current Every Day Smoker -- 1.50 packs/day    Types: Cigarettes  . Smokeless tobacco: Never Used     Comment: Using E-cigarette also.  Marland Kitchen Alcohol Use: No   OB History    No data available     Review of Systems  Unable to perform ROS: Mental status change  Gastrointestinal: Positive for nausea and abdominal pain. Negative for vomiting.  Psychiatric/Behavioral: Positive for confusion. Negative for self-injury.      Allergies  Dust mite extract; Januvia; Latex; Metformin; Metoclopramide hcl; Penicillins; and Sulfonamide derivatives  Home  Medications   Prior to Admission medications   Medication Sig Start Date End Date Taking? Authorizing Provider  albuterol (PROVENTIL) (2.5 MG/3ML) 0.083% nebulizer solution Take 3 mLs (2.5 mg total) by nebulization every 6 (six) hours as needed for wheezing. 07/25/14  Yes Kerrie Buffalo, NP  albuterol-ipratropium (COMBIVENT) 18-103 MCG/ACT inhaler Inhale 2 puffs into the lungs every 6 (six) hours as needed for wheezing. 03/28/14  Yes Tammi Sou, MD  amLODipine (NORVASC) 10 MG tablet TAKE ONE TABLET BY MOUTH DAILY FOR HIGH BLOOD PRESSURE. 09/22/14  Yes Tammi Sou, MD  aspirin 81 MG EC tablet Take 1 tablet (81 mg total) by mouth daily. For platelette aggregation. 07/25/14  Yes Kerrie Buffalo, NP  benazepril (LOTENSIN) 20 MG tablet TAKE ONE TABLET BY MOUTH DAILY FOR HIGH BLOOD PRESSURE. 09/22/14  Yes Tammi Sou, MD  benztropine (COGENTIN) 1 MG tablet Take 1 tablet (1 mg total) by mouth 2 (two) times daily as needed for tremors. 07/25/14  Yes Kerrie Buffalo, NP  carvedilol (COREG) 12.5 MG tablet TAKE (1/2) TABLET BY MOUTH TWICE DAILY. 01/23/15  Yes Tammi Sou, MD  COMBIVENT RESPIMAT 20-100 MCG/ACT AERS respimat INHALE 1 PUFF INTO THE LUNGS EVERY 6 HOURS AS NEEDED FOR WHEEZING. 12/09/14  Yes Tammi Sou, MD  diclofenac sodium (VOLTAREN) 1 % GEL Apply 2g to affected joint up to 4 times daily as needed 08/30/14  Yes Tammi Sou, MD  DULoxetine (CYMBALTA) 60 MG capsule TAKE ONE CAPSULE BY MOUTH DAILY FOR ANXIETY AND DEPRESSION. 01/04/15  Yes Tammi Sou, MD  estradiol (ESTRACE) 0.1 MG/GM vaginal cream 6.06 applicators full qd 30/16/01  Yes Kerrie Buffalo, NP  fexofenadine (ALLEGRA) 60 MG tablet 1 tab po bid prn for nasal allergies 08/17/14  Yes Tammi Sou, MD  fish oil-omega-3 fatty acids 1000 MG capsule Take 2 capsules (2 g total) by mouth daily. For lipid control. 08/15/12  Yes Neil T Mashburn, PA-C  fluticasone (CUTIVATE) 0.05 % cream APPLY TO AFFECTED AREAS ON SKIN  TWICE DAILY AS NEEDED. 12/19/14  Yes Tammi Sou, MD  gabapentin (NEURONTIN) 100 MG capsule TAKE (1) CAPSULE BY MOUTH AT BEDTIME. 10/17/14  Yes Tammi Sou, MD  glimepiride (AMARYL) 2 MG tablet TAKE 1 TABLET BY MOUTH EVERY MORNING FOR DIABETES. 01/04/15  Yes Tammi Sou, MD  glycopyrrolate (ROBINUL) 2 MG tablet Take 1 tablet (2 mg total) by mouth 3 (three) times daily as needed. 07/25/14  Yes Kerrie Buffalo, NP  lamoTRIgine (LAMICTAL) 25 MG tablet TAKE 1 TABLET BY MOUTH TWICE A DAY. 10/18/14  Yes Tammi Sou, MD  lidocaine (LIDODERM) 5 % Place 3 patches onto the skin 2 (two) times daily. Remove & Discard patch within 12 hours or as directed by MD 07/25/14  Yes Kerrie Buffalo, NP  methocarbamol (ROBAXIN) 500 MG tablet Take  1 tablet (500 mg total) by mouth 3 (three) times daily as needed. FOR MUSCLE SPASMS 07/25/14  Yes Kerrie Buffalo, NP  metoprolol succinate (TOPROL-XL) 50 MG 24 hr tablet TAKE ONE TABLET BY MOUTH EVERY DAY WITH OR IMMEDIATELY FOLLOWING A MEAL. 09/22/14  Yes Tammi Sou, MD  nitroGLYCERIN (NITROSTAT) 0.4 MG SL tablet Place 1 tablet (0.4 mg total) under the tongue every 5 (five) minutes as needed for chest pain. 07/25/14  Yes Kerrie Buffalo, NP  OLANZapine zydis (ZYPREXA) 5 MG disintegrating tablet Take 1 tablet (5 mg total) by mouth every 8 (eight) hours as needed (agitation / psychosis). 07/25/14  Yes Kerrie Buffalo, NP  omeprazole (PRILOSEC) 20 MG capsule TAKE (1) CAPSULE BY MOUTH TWICE DAILY. 09/22/14  Yes Tammi Sou, MD  pravastatin (PRAVACHOL) 80 MG tablet Take 1 tablet (80 mg total) by mouth daily. 07/25/14  Yes Kerrie Buffalo, NP  triamcinolone (NASACORT AQ) 55 MCG/ACT AERO nasal inhaler Place 2 sprays into the nose daily. 07/25/14  Yes Kerrie Buffalo, NP   BP 140/70 mmHg  Pulse 104  Temp(Src) 97.7 F (36.5 C) (Oral)  Resp 18  Wt 150 lb (68.04 kg)  SpO2 94% Physical Exam  Constitutional: She appears well-developed and well-nourished.  HENT:   Head: Normocephalic and atraumatic.  Eyes: Conjunctivae and EOM are normal. Pupils are equal, round, and reactive to light.  Cardiovascular: Tachycardia present.   Pulmonary/Chest: Effort normal and breath sounds normal. No respiratory distress.  Abdominal: Soft. She exhibits no distension. There is tenderness.  Musculoskeletal: Normal range of motion. She exhibits no edema.  Neurological:  Sleepy, doesn't participate in exam well but will awake and follow commands, moves all extremities, has sensation to pain and light touch in all extremities as well  Nursing note and vitals reviewed.   ED Course  Procedures (including critical care time) Labs Review Labs Reviewed  COMPREHENSIVE METABOLIC PANEL - Abnormal; Notable for the following:    Potassium 2.7 (*)    Glucose, Bld 109 (*)    ALT 13 (*)    All other components within normal limits  CBC WITH DIFFERENTIAL/PLATELET - Abnormal; Notable for the following:    WBC 11.5 (*)    RBC 5.48 (*)    Hemoglobin 16.0 (*)    HCT 46.4 (*)    Neutro Abs 8.7 (*)    All other components within normal limits  URINE RAPID DRUG SCREEN, HOSP PERFORMED - Abnormal; Notable for the following:    Opiates POSITIVE (*)    Benzodiazepines POSITIVE (*)    All other components within normal limits  ACETAMINOPHEN LEVEL - Abnormal; Notable for the following:    Acetaminophen (Tylenol), Serum <10 (*)    All other components within normal limits  BLOOD GAS, ARTERIAL - Abnormal; Notable for the following:    pO2, Arterial 54.4 (*)    Bicarbonate 24.6 (*)    All other components within normal limits  ETHANOL  SALICYLATE LEVEL  URINALYSIS, ROUTINE W REFLEX MICROSCOPIC (NOT AT [facility])  CBC  COMPREHENSIVE METABOLIC PANEL  MAGNESIUM    Imaging Review Ct Head Wo Contrast  06/16/2015  CLINICAL DATA:  Acute encephalopathy. Altered mental status. Behavior and auditory hallucinations. EXAM: CT HEAD WITHOUT CONTRAST TECHNIQUE: Contiguous axial images were  obtained from the base of the skull through the vertex without intravenous contrast. COMPARISON:  MR brain 03/10/2010 FINDINGS: No acute intracranial abnormality. Specifically, no hemorrhage, hydrocephalus, mass lesion, acute infarction, or significant intracranial injury. No acute calvarial abnormality. Mild cerebral atrophy.  Visualized paranasal sinuses and mastoids clear. Orbital soft tissues unremarkable. IMPRESSION: No acute intracranial abnormality. Electronically Signed   By: Rolm Baptise M.D.   On: 06/16/2015 09:25   Ct Abdomen Pelvis W Contrast  06/16/2015  CLINICAL DATA:  Diffuse abdominal pain and tenderness.  Lethargy. EXAM: CT ABDOMEN AND PELVIS WITH CONTRAST TECHNIQUE: Multidetector CT imaging of the abdomen and pelvis was performed using the standard protocol following bolus administration of intravenous contrast. CONTRAST:  148mL OMNIPAQUE IOHEXOL 300 MG/ML  SOLN COMPARISON:  11/16/2009 FINDINGS: Lower chest:  No acute findings. Hepatobiliary: No masses identified. Focal fatty infiltration again seen adjacent falciform ligament and gallbladder fossa. Prior cholecystectomy noted biliary ductal dilatation remains stable. Pancreas: No mass, inflammatory changes, or other significant abnormality. Spleen: Within normal limits in size and appearance. Adrenals/Urinary Tract: No masses identified. No evidence of hydronephrosis. Stomach/Bowel: No evidence of obstruction, inflammatory process, or abnormal fluid collections. Sigmoid diverticulosis is demonstrated, however there is no evidence of diverticulitis. Vascular/Lymphatic: No pathologically enlarged lymph nodes. No evidence of abdominal aortic aneurysm. Reproductive: Prior hysterectomy noted. Adnexal regions are unremarkable in appearance. Other: None. Musculoskeletal: No suspicious bone lesions identified. Old mild L1 and L5 vertebral body compression deformities again noted. IMPRESSION: Sigmoid diverticulosis. No radiographic evidence of  diverticulitis or other acute findings. Electronically Signed   By: Earle Gell M.D.   On: 06/16/2015 07:45   I have personally reviewed and evaluated these images and lab results as part of my medical decision-making.   EKG Interpretation   Date/Time:  Friday June 16 2015 05:56:19 EDT Ventricular Rate:  94 PR Interval:  164 QRS Duration: 82 QT Interval:  376 QTC Calculation: 470 R Axis:   84 Text Interpretation:  Normal sinus rhythm Normal ECG Confirmed by Khaden Gater  MD, Corene Cornea [phone]) on 06/16/2015 7:20:43 AM      MDM   Final diagnoses:  Hypokalemia  Altered mental status, unspecified altered mental status type   69 yo F brought in by police for questionable psychotic vs SI behavior. Complaining of abdominal pain and right forearm burn on arrival. Right forearm wound is old, appears to be well healing without evidence of infection. Abdomen with ttp but no e/o peritonitis. Labs and ct performed showing potassium of 2.7. Unknown cause. ecg without abnromalities. Started on potassium and admitted for further repletion, reevaluation for mental health abnormalities.       Merrily Pew, MD 06/16/15 2122

## 2015-06-16 NOTE — ED Notes (Signed)
Respiratory has attempted x3 for ABG.  Dr Sarajane Jews notified.

## 2015-06-17 DIAGNOSIS — R4182 Altered mental status, unspecified: Secondary | ICD-10-CM | POA: Diagnosis not present

## 2015-06-17 DIAGNOSIS — J449 Chronic obstructive pulmonary disease, unspecified: Secondary | ICD-10-CM | POA: Diagnosis not present

## 2015-06-17 DIAGNOSIS — G9340 Encephalopathy, unspecified: Secondary | ICD-10-CM | POA: Diagnosis not present

## 2015-06-17 DIAGNOSIS — F3162 Bipolar disorder, current episode mixed, moderate: Secondary | ICD-10-CM | POA: Diagnosis present

## 2015-06-17 LAB — COMPREHENSIVE METABOLIC PANEL
ALBUMIN: 3.1 g/dL — AB (ref 3.5–5.0)
ALT: 10 U/L — ABNORMAL LOW (ref 14–54)
ANION GAP: 8 (ref 5–15)
AST: 18 U/L (ref 15–41)
Alkaline Phosphatase: 61 U/L (ref 38–126)
BUN: 7 mg/dL (ref 6–20)
CHLORIDE: 108 mmol/L (ref 101–111)
CO2: 26 mmol/L (ref 22–32)
Calcium: 8.8 mg/dL — ABNORMAL LOW (ref 8.9–10.3)
Creatinine, Ser: 0.54 mg/dL (ref 0.44–1.00)
GFR calc Af Amer: >60 mL/min (ref >60)
GFR calc non Af Amer: >60 mL/min (ref >60)
GLUCOSE: 93 mg/dL (ref 65–99)
POTASSIUM: 3.3 mmol/L — AB (ref 3.5–5.1)
SODIUM: 142 mmol/L (ref 135–145)
Total Bilirubin: 0.7 mg/dL (ref 0.3–1.2)
Total Protein: 6.4 g/dL — ABNORMAL LOW (ref 6.5–8.1)

## 2015-06-17 LAB — MAGNESIUM: Magnesium: 1.8 mg/dL (ref 1.7–2.4)

## 2015-06-17 LAB — C DIFFICILE QUICK SCREEN W PCR REFLEX
C DIFFICILE (CDIFF) INTERP: NEGATIVE
C DIFFICLE (CDIFF) ANTIGEN: NEGATIVE
C Diff toxin: NEGATIVE

## 2015-06-17 LAB — CBC
HCT: 44 % (ref 36.0–46.0)
Hemoglobin: 14.6 g/dL (ref 12.0–15.0)
MCH: 28.3 pg (ref 26.0–34.0)
MCHC: 33.2 g/dL (ref 30.0–36.0)
MCV: 85.4 fL (ref 78.0–100.0)
PLATELETS: 362 10*3/uL (ref 150–400)
RBC: 5.15 MIL/uL — ABNORMAL HIGH (ref 3.87–5.11)
RDW: 15.4 % (ref 11.5–15.5)
WBC: 9 10*3/uL (ref 4.0–10.5)

## 2015-06-17 MED ORDER — POTASSIUM CHLORIDE CRYS ER 20 MEQ PO TBCR
40.0000 meq | EXTENDED_RELEASE_TABLET | Freq: Once | ORAL | Status: AC
  Administered 2015-06-17: 40 meq via ORAL
  Filled 2015-06-17: qty 2

## 2015-06-17 MED ORDER — CLONAZEPAM 0.5 MG PO TABS
1.0000 mg | ORAL_TABLET | Freq: Three times a day (TID) | ORAL | Status: DC
  Administered 2015-06-17 – 2015-06-19 (×6): 1 mg via ORAL
  Filled 2015-06-17 (×6): qty 2

## 2015-06-17 MED ORDER — BENZTROPINE MESYLATE 1 MG PO TABS
0.5000 mg | ORAL_TABLET | Freq: Two times a day (BID) | ORAL | Status: DC
  Administered 2015-06-17 – 2015-06-19 (×4): 0.5 mg via ORAL
  Filled 2015-06-17 (×4): qty 1

## 2015-06-17 MED ORDER — OLANZAPINE 5 MG PO TBDP
5.0000 mg | ORAL_TABLET | Freq: Two times a day (BID) | ORAL | Status: DC | PRN
  Filled 2015-06-17: qty 1

## 2015-06-17 MED ORDER — OXYCODONE-ACETAMINOPHEN 10-325 MG PO TABS: 2.0000 | ORAL_TABLET | Freq: Three times a day (TID) | ORAL | Status: DC | PRN

## 2015-06-17 MED ORDER — OXYCODONE-ACETAMINOPHEN 5-325 MG PO TABS
2.0000 | ORAL_TABLET | Freq: Three times a day (TID) | ORAL | Status: DC | PRN
  Administered 2015-06-17 – 2015-06-19 (×4): 2 via ORAL
  Filled 2015-06-17 (×4): qty 2

## 2015-06-17 MED ORDER — OLANZAPINE 5 MG PO TBDP
5.0000 mg | ORAL_TABLET | Freq: Every day | ORAL | Status: DC
  Administered 2015-06-17 – 2015-06-18 (×2): 5 mg via ORAL
  Filled 2015-06-17 (×4): qty 1

## 2015-06-17 MED ORDER — OXYCODONE HCL 5 MG PO TABS
10.0000 mg | ORAL_TABLET | Freq: Three times a day (TID) | ORAL | Status: DC | PRN
  Administered 2015-06-17 – 2015-06-19 (×5): 10 mg via ORAL
  Filled 2015-06-17 (×5): qty 2

## 2015-06-17 NOTE — Progress Notes (Signed)
Disposition CSW completed patient referrals to the following inpatient Geri[facility] facilities:  [facility]  CSW will continue to assist with placement needs.  [facility] Disposition CSW [phone]

## 2015-06-17 NOTE — Discharge Summary (Addendum)
Physician Discharge Summary  [name] KYH:[id number] DOB: 1946-07-28 DOA: 06/16/2015  PCP: No PCP Per Patient  Admit date: 06/16/2015 Discharge date: 06/19/2015  Plan transfer to [facility] for inpatient treatment for psychosis  Discharge Diagnoses:  1. Bipolar 1 disorder, mixed, moderate  2. Acute encephalopathy. 3. Hallucinations, suspected psychosis. 4. Hypokalemia. 5. DM Type 2 6. COPD  Discharge Condition: Improved Disposition: [facility] for inpatient treatment  Diet recommendation: Regular  Filed Weights   06/16/15 0508  Weight: 68.04 kg (150 lb)    History of present illness:  46 yowh PMH of bipolar disorder with psychosis with multiple other medical disorders including COPD, presented after being picked up by police for wandering behavior and reportedly with auditory hallucinations. In the ED found to be hypokalemic and with acute encephalopathy. She was referred for admission.   Hospital Course:  Acute encephalopathy/lethargy, likely multifactorial, resolved with supportive care. Remains awake and alert. Patient did not exhibit any neurological defecits. Condition complicated by acute psychosis and paranoia. Psychiatry consulted and recommended inpatient psych. Hospitalization was uncomplicated.  Individual issues as below:  1. Acute encephalopathy, resolved. Suspect multifactorial including polypharmacy-induced and psychosis. UDS positive for BZD and opiates. Tylenol, salicylate level and alcohol levels negative. CT head negative. No focal neurological deficits.  2. Acute psychosis superimposed on bipolar disorder due to polypharmacy use. Appears stable. Psychiatry recommended inpatient psychiatric admission.  3. Abdominal pain, subacute/chronic. Abdomen soft, tolerating diet, CT abd/pelvis reassuring.  4. COPD  5. DM Type 2, stable, diet controlled.   Overall improved. Remains medically clear.  Discussed again with patient--issues on admission (psychosis,  paranoia, hallucinations). We discussed recommendation for inpatient treatment at [facility] for these issues and she consents. She has been compliant the last 72 hours, has made no attempts to leave and recommendations for inpatient treatment were discussed with her and she did consent 10/15, 10/16.  Transfer to psych facility today  Consultants:  Psychiatry   Procedures:  None  Antibiotics:  none   Discharge Instructions Discharge Instructions    Activity as tolerated - No restrictions    Complete by:  As directed      Diet general    Complete by:  As directed              Allergies  Allergen Reactions  . Dust Mite Extract   . Januvia [Sitagliptin Phosphate] Nausea Only  . Latex   . Metformin     REACTION: Dizziness \\T \ Nausea  . Metoclopramide Hcl   . Penicillins   . Sulfonamide Derivatives     REACTION: Reaction not known    The results of significant diagnostics from this hospitalization (including imaging, microbiology, ancillary and laboratory) are listed below for reference.    Significant Diagnostic Studies: Ct Head Wo Contrast  06/16/2015  CLINICAL DATA:  Acute encephalopathy. Altered mental status. Behavior and auditory hallucinations. EXAM: CT HEAD WITHOUT CONTRAST TECHNIQUE: Contiguous axial images were obtained from the base of the skull through the vertex without intravenous contrast. COMPARISON:  MR brain 03/10/2010 FINDINGS: No acute intracranial abnormality. Specifically, no hemorrhage, hydrocephalus, mass lesion, acute infarction, or significant intracranial injury. No acute calvarial abnormality. Mild cerebral atrophy. Visualized paranasal sinuses and mastoids clear. Orbital soft tissues unremarkable. IMPRESSION: No acute intracranial abnormality. Electronically Signed   By: Rolm Baptise M.D.   On: 06/16/2015 09:25   Ct Abdomen Pelvis W Contrast  06/16/2015  CLINICAL DATA:  Diffuse abdominal pain and tenderness.  Lethargy. EXAM: CT ABDOMEN AND PELVIS  WITH CONTRAST TECHNIQUE:  Multidetector CT imaging of the abdomen and pelvis was performed using the standard protocol following bolus administration of intravenous contrast. CONTRAST:  159mL OMNIPAQUE IOHEXOL 300 MG/ML  SOLN COMPARISON:  11/16/2009 FINDINGS: Lower chest:  No acute findings. Hepatobiliary: No masses identified. Focal fatty infiltration again seen adjacent falciform ligament and gallbladder fossa. Prior cholecystectomy noted biliary ductal dilatation remains stable. Pancreas: No mass, inflammatory changes, or other significant abnormality. Spleen: Within normal limits in size and appearance. Adrenals/Urinary Tract: No masses identified. No evidence of hydronephrosis. Stomach/Bowel: No evidence of obstruction, inflammatory process, or abnormal fluid collections. Sigmoid diverticulosis is demonstrated, however there is no evidence of diverticulitis. Vascular/Lymphatic: No pathologically enlarged lymph nodes. No evidence of abdominal aortic aneurysm. Reproductive: Prior hysterectomy noted. Adnexal regions are unremarkable in appearance. Other: None. Musculoskeletal: No suspicious bone lesions identified. Old mild L1 and L5 vertebral body compression deformities again noted. IMPRESSION: Sigmoid diverticulosis. No radiographic evidence of diverticulitis or other acute findings. Electronically Signed   By: Earle Gell M.D.   On: 06/16/2015 07:45    Microbiology: Recent Results (from the past 240 hour(s))  C difficile quick scan w PCR reflex     Status: None   Collection Time: 06/17/15  9:00 AM  Result Value Ref Range Status   C Diff antigen NEGATIVE NEGATIVE Final   C Diff toxin NEGATIVE NEGATIVE Final   C Diff interpretation Negative for toxigenic C. difficile  Final     Labs: Basic Metabolic Panel:  Recent Labs Lab 06/16/15 0531 06/17/15 0633  NA 138 142  K 2.7* 3.3*  CL 101 108  CO2 27 26  GLUCOSE 109* 93  BUN 7 7  CREATININE 0.67 0.54  CALCIUM 9.1 8.8*  MG  --  1.8   Liver  Function Tests:  Recent Labs Lab 06/16/15 0531 06/17/15 0633  AST 19 18  ALT 13* 10*  ALKPHOS 74 61  BILITOT 0.7 0.7  PROT 7.3 6.4*  ALBUMIN 3.6 3.1*    CBC:  Recent Labs Lab 06/16/15 0531 06/17/15 0633  WBC 11.5* 9.0  NEUTROABS 8.7*  --   HGB 16.0* 14.6  HCT 46.4* 44.0  MCV 84.7 85.4  PLT 346 362      Principal Problem:   Bipolar 1 disorder, mixed, moderate (H[facility]) Active Problems:   COPD (chronic obstructive pulmonary disease) (H[facility])   Hypokalemia   Psychosis   Acute encephalopathy   Hallucination   Time coordinating discharge: 30 minutes  Signed:  Murray Hodgkins, MD [facility] 06/19/2015, 11:45 AM  By signing my name below, I, Rosalie Doctor attest that this documentation has been prepared under the direction and in the presence of Murray Hodgkins, MD Electronically signed: Rosalie Doctor, Scribe.  06/17/2015   I personally performed the services described in this documentation. All medical record entries made by the scribe were at my direction. I have reviewed the chart and agree that the record reflects my personal performance and is accurate and complete. Murray Hodgkins, MD
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
## 2015-06-18 DIAGNOSIS — F29 Unspecified psychosis not due to a substance or known physiological condition: Secondary | ICD-10-CM | POA: Diagnosis not present

## 2015-06-18 DIAGNOSIS — R4182 Altered mental status, unspecified: Secondary | ICD-10-CM | POA: Diagnosis not present

## 2015-06-18 DIAGNOSIS — F3162 Bipolar disorder, current episode mixed, moderate: Secondary | ICD-10-CM | POA: Diagnosis not present

## 2015-06-18 DIAGNOSIS — J449 Chronic obstructive pulmonary disease, unspecified: Secondary | ICD-10-CM | POA: Diagnosis not present

## 2015-06-18 LAB — GLUCOSE, CAPILLARY
GLUCOSE-CAPILLARY: 133 mg/dL — AB (ref 65–99)
Glucose-Capillary: 109 mg/dL — ABNORMAL HIGH (ref 65–99)
Glucose-Capillary: 128 mg/dL — ABNORMAL HIGH (ref 65–99)

## 2015-06-18 MED ORDER — IPRATROPIUM-ALBUTEROL 0.5-2.5 (3) MG/3ML IN SOLN
3.0000 mL | Freq: Four times a day (QID) | RESPIRATORY_TRACT | Status: DC
  Administered 2015-06-18 (×3): 3 mL via RESPIRATORY_TRACT
  Filled 2015-06-18 (×3): qty 3

## 2015-06-18 MED ORDER — LAMOTRIGINE 25 MG PO TABS
25.0000 mg | ORAL_TABLET | Freq: Two times a day (BID) | ORAL | Status: DC
  Administered 2015-06-18 – 2015-06-19 (×3): 25 mg via ORAL
  Filled 2015-06-18 (×7): qty 1

## 2015-06-18 MED ORDER — PANTOPRAZOLE SODIUM 40 MG PO TBEC
40.0000 mg | DELAYED_RELEASE_TABLET | Freq: Every day | ORAL | Status: DC
  Administered 2015-06-18: 40 mg via ORAL
  Filled 2015-06-18: qty 1

## 2015-06-18 MED ORDER — PANTOPRAZOLE SODIUM 40 MG PO TBEC
40.0000 mg | DELAYED_RELEASE_TABLET | Freq: Two times a day (BID) | ORAL | Status: DC
  Administered 2015-06-18 – 2015-06-19 (×2): 40 mg via ORAL
  Filled 2015-06-18 (×2): qty 1

## 2015-06-18 MED ORDER — ASPIRIN 81 MG PO CHEW
81.0000 mg | CHEWABLE_TABLET | Freq: Every day | ORAL | Status: DC
  Administered 2015-06-18 – 2015-06-19 (×2): 81 mg via ORAL
  Filled 2015-06-18 (×4): qty 1

## 2015-06-18 MED ORDER — PRAVASTATIN SODIUM 40 MG PO TABS
80.0000 mg | ORAL_TABLET | Freq: Every day | ORAL | Status: DC
  Administered 2015-06-18 – 2015-06-19 (×2): 80 mg via ORAL
  Filled 2015-06-18 (×2): qty 2

## 2015-06-18 MED ORDER — DULOXETINE HCL 60 MG PO CPEP
60.0000 mg | ORAL_CAPSULE | Freq: Every day | ORAL | Status: DC
  Administered 2015-06-18 – 2015-06-19 (×2): 60 mg via ORAL
  Filled 2015-06-18 (×2): qty 1

## 2015-06-18 MED ORDER — IPRATROPIUM-ALBUTEROL 18-103 MCG/ACT IN AERO: 2.0000 | INHALATION_SPRAY | Freq: Four times a day (QID) | RESPIRATORY_TRACT | Status: DC

## 2015-06-18 MED ORDER — INSULIN ASPART 100 UNIT/ML ~~LOC~~ SOLN
0.0000 [IU] | Freq: Three times a day (TID) | SUBCUTANEOUS | Status: DC
  Administered 2015-06-18 (×2): 1 [IU] via SUBCUTANEOUS

## 2015-06-18 NOTE — Progress Notes (Signed)
Pt rested well with pain meds given.

## 2015-06-18 NOTE — Progress Notes (Signed)
PROGRESS NOTE  [name] WNI:[id number] DOB: 1946/07/19 DOA: 06/16/2015 PCP: No PCP Per Patient  Summary: 69 yo female with PMH of bipolar disorder with psychosis with multiple other medical disorders including COPD, presented after being picked up by police for wandering behavior and reportedly with auditory hallucinations. In the ED found to be hypokalemic and with acute encephalopathy. She was referred for admission.   Assessment/Plan: 1. Acute encephalopathy, resolved. Suspect multifactorial including polypharmacy induced and psychosis. UDS positive for BZD and opiates. Tylenol, salicylate level and alcohol levels negative. CT head negative. No focal neurological deficits.  2. Acute psychosis superimposed on bipolar disorder due to polypharmacy use. Psychiatry consulted and recommends inpatient psychiatric admission.  3. Abdominal pain, subacute/chronic. Exam unremarkable, CT abd/pelvis reassuring. 4. COPD with hypoxemia without hypoxia. ABG noted. Breathing comfortably on RA without hypoxia.  5. DM Type 2, stable. Continue SSI.   Overall improved. Medically cleared.  Inpatient psych unit per psychiatry   CCode Status: Full DVT prophylaxis:SCDs Family discussion: Discussed with patient who understands and has no concerns at this time. Disposition Plan: Inpatient psychiatric facility per psych.   Murray Hodgkins, MD  [facility]  Pager [phone] If 7PM-7AM, please contact night-coverage at [email or web address], password [facility] 06/18/2015, 6:50 AM    Consultants:  Psychiatry  Procedures:    Antibiotics:    HPI/Subjective: Has a better appetite and was able to sleep last night. Has pain all over her body.   Objective: Filed Vitals:   06/17/15 0542 06/17/15 1540 06/17/15 2223 06/18/15 0507  BP: 115/58 126/66 120/56 109/57  Pulse: 86 88  73  Temp: 97.4 F (36.3 C) 98 F (36.7 C) 98.4 F (36.9 C) 98 F (36.7 C)  TempSrc: Oral Oral Oral Oral  Resp: 16 16 20  17   Weight:      SpO2: 99% 96% 97% 95%    Intake/Output Summary (Last 24 hours) at 06/18/15 0650 Last data filed at 06/17/15 1802  Gross per 24 hour  Intake    200 ml  Output      0 ml  Net    200 ml     Filed Weights   06/16/15 0508  Weight: 68.04 kg (150 lb)    Exam:    VSS, afebrile, not hypoxic General:  Appears comfortable, calm. Cardiovascular: Regular rate and rhythm, no murmur, rub or gallop. No lower extremity edema. Respiratory: Clear to auscultation bilaterally, no wheezes, rales or rhonchi. Normal respiratory effort. Abdomen: soft, ntnd Psychiatric: grossly normal mood and affect, speech fluent and appropriate Neurologic: grossly non-focal.  New data reviewed:  none  Pertinent data since admission:  Potassium 2.7  Abdominal CT IMPRESSION: Sigmoid diverticulosis. No radiographic evidence of diverticulitis or other acute findings.  CT head negative  Pending data:    Scheduled Meds: . benztropine  0.5 mg Oral BID  . clonazePAM  1 mg Oral TID  . gabapentin  100 mg Oral QHS  . OLANZapine zydis  5 mg Oral QHS  . sodium chloride  3 mL Intravenous Q12H  . sodium chloride  3 mL Intravenous Q12H   Continuous Infusions:   Principal Problem:   Bipolar 1 disorder, mixed, moderate (HCC) Active Problems:   COPD (chronic obstructive pulmonary disease) (HCC)   Hypokalemia   Psychosis   Acute encephalopathy   Hallucination    By signing my name below, I, Rosalie Doctor attest that this documentation has been prepared under the direction and in the presence of Murray Hodgkins, MD Electronically signed: Rosalie Doctor,  Scribe. 06/18/2015 9:32am  I personally performed the services described in this documentation. All medical record entries made by the scribe were at my direction. I have reviewed the chart and agree that the record reflects my personal performance and is accurate and complete. Murray Hodgkins, MD

## 2015-06-19 DIAGNOSIS — R4182 Altered mental status, unspecified: Secondary | ICD-10-CM | POA: Diagnosis not present

## 2015-06-19 DIAGNOSIS — F3162 Bipolar disorder, current episode mixed, moderate: Secondary | ICD-10-CM

## 2015-06-19 DIAGNOSIS — J449 Chronic obstructive pulmonary disease, unspecified: Secondary | ICD-10-CM

## 2015-06-19 LAB — GLUCOSE, CAPILLARY
GLUCOSE-CAPILLARY: 90 mg/dL (ref 65–99)
Glucose-Capillary: 113 mg/dL — ABNORMAL HIGH (ref 65–99)

## 2015-06-19 MED ORDER — OLANZAPINE 5 MG PO TBDP: 5.0000 mg | ORAL_TABLET | Freq: Every day | ORAL | Status: AC

## 2015-06-19 MED ORDER — INFLUENZA VAC SPLIT QUAD 0.5 ML IM SUSY
0.5000 mL | PREFILLED_SYRINGE | INTRAMUSCULAR | Status: AC
  Administered 2015-06-19: 0.5 mL via INTRAMUSCULAR
  Filled 2015-06-19: qty 0.5

## 2015-06-19 MED ORDER — IPRATROPIUM-ALBUTEROL 0.5-2.5 (3) MG/3ML IN SOLN
3.0000 mL | Freq: Three times a day (TID) | RESPIRATORY_TRACT | Status: DC
  Administered 2015-06-19: 3 mL via RESPIRATORY_TRACT
  Filled 2015-06-19: qty 3

## 2015-06-19 MED ORDER — BENZTROPINE MESYLATE 0.5 MG PO TABS: 0.5000 mg | ORAL_TABLET | Freq: Two times a day (BID) | ORAL | Status: AC

## 2015-06-19 NOTE — Progress Notes (Signed)
Report called to Margrett Rud RN. [facility] here to transport patient. Patient is leaving with all of her belonging which she and her sitter went through and patient confirmed she had everything.

## 2015-06-19 NOTE — Progress Notes (Signed)
PROGRESS NOTE  JOYCELIN Mathews IYM:[id number] DOB: [birth year] DOA: 06/16/2015 PCP: Lynne Logan, MD  Summary: 69 yo female with PMH of bipolar disorder with psychosis with multiple other medical disorders including COPD, presented after being picked up by police for wandering behavior and reportedly with auditory hallucinations. In the ED found to be hypokalemic and with acute encephalopathy. She was referred for admission.   Assessment/Plan: 1. Acute encephalopathy, resolved. Suspect multifactorial including polypharmacy-induced and psychosis. UDS positive for BZD and opiates. Tylenol, salicylate level and alcohol levels negative. CT head negative. No focal neurological deficits.  2. Acute psychosis superimposed on bipolar disorder due to polypharmacy use. Appears stable. Psychiatry recommended inpatient psychiatric admission.  3. Abdominal pain, subacute/chronic. Abdomen soft, tolerating diet, CT abd/pelvis reassuring.  4. COPD   5. DM Type 2, stable, diet controlled.   Overall improved. Remains medically clear.  Discussed again with patient--issues on admission (psychosis, paranoia, hallucinations). We discussed recommendation for inpatient treatment at [facility] for these issues and she consents. She has been compliant the last 72 hours, has made no attempts to leave and recommendations for inpatient treatment were discussed with her and she did consent 10/15, 10/16.  Transfer to psych facility today  Code Status: Full DVT prophylaxis:SCDs Family discussion: Discussed with patient who understands and has no concerns at this time. Disposition Plan: Inpatient psychiatric facility per psych.   Murray Hodgkins, MD  [facility]  Pager [phone] If 7PM-7AM, please contact night-coverage at [email or web address], password [facility] 06/19/2015, 10:15 AM    Consultants:  Psychiatry  Procedures:    Antibiotics:    HPI/Subjective: Feeling better. Was able to eat with some mild nausea.  Acid reflux is doing ok. No pain or vomiting. Denies auditory and visual hallucinations. Does complain of constipation.  Objective: Filed Vitals:   06/18/15 1938 06/18/15 2212 06/19/15 0631 06/19/15 0744  BP:  147/91 131/57   Pulse:  75 66   Temp:  98.2 F (36.8 C) 98.4 F (36.9 C)   TempSrc:  Oral Oral   Resp:  16 18   Weight:      SpO2: 96% 95% 96% 95%    Intake/Output Summary (Last 24 hours) at 06/19/15 1015 Last data filed at 06/19/15 0849  Gross per 24 hour  Intake    643 ml  Output    500 ml  Net    143 ml     Filed Weights   06/16/15 0508  Weight: 68.04 kg (150 lb)    Exam:    VSS, afebrile, not hypoxic General:  Lying in bed and appears calm and comfortable Cardiovascular: RRR, no m/r/g. No LE edema. Respiratory: CTA bilaterally, no w/r/r. Normal respiratory effort. Abdomen: soft, ntnd Musculoskeletal: grossly normal tone BUE/BLE Psychiatric: grossly normal mood and affect, speech fluent and appropriate Neurologic: grossly non-focal.  New data reviewed:  CBG 90  Pertinent data since admission:  Abdominal CT IMPRESSION: Sigmoid diverticulosis. No radiographic evidence of diverticulitis or other acute findings.  CT head negative  Pending data:    Scheduled Meds: . aspirin  81 mg Oral Daily  . benztropine  0.5 mg Oral BID  . clonazePAM  1 mg Oral TID  . DULoxetine  60 mg Oral Daily  . gabapentin  100 mg Oral QHS  . Influenza vac split quadrivalent PF  0.5 mL Intramuscular Tomorrow-1000  . ipratropium-albuterol  3 mL Nebulization TID  . lamoTRIgine  25 mg Oral BID  . OLANZapine zydis  5 mg Oral QHS  . pantoprazole  40  mg Oral BID AC  . pravastatin  80 mg Oral Daily  . sodium chloride  3 mL Intravenous Q12H  . sodium chloride  3 mL Intravenous Q12H   Continuous Infusions:   Principal Problem:   Bipolar 1 disorder, mixed, moderate (HCC) Active Problems:   COPD (chronic obstructive pulmonary disease) (HCC)   Hypokalemia   Psychosis    Acute encephalopathy   Hallucination   By signing my name below, I, Rennis Harding attest that this documentation has been prepared under the direction and in the presence of Murray Hodgkins, MD Electronically signed: Rennis Harding  06/19/2015 11:21 AM   I personally performed the services described in this documentation. All medical record entries made by the scribe were at my direction. I have reviewed the chart and agree that the record reflects my personal performance and is accurate and complete. Murray Hodgkins, MD

## 2015-06-19 NOTE — Care Management Note (Signed)
Case Management Note  Patient Details  Name: [name] MRN: [id number] Date of Birth: 06-03-1946  Subjective/Objective:                  Pt admitted from home with acute encephalopathy. Psych recommends inpatient treatment at discharge.   Action/Plan: Pt discharged to [facility] for inpt psych treatment.   Expected Discharge Date:  06/20/15               Expected Discharge Plan:  Psychiatric Hospital  In-House Referral:  Clinical Social Work  Discharge planning Services  CM Consult  Post Acute Care Choice:  NA Choice offered to:  NA  DME Arranged:    DME Agency:     HH Arranged:    [place] Agency:     Status of Service:  Completed, signed off  Medicare Important Message Given:    Date Medicare IM Given:    Medicare IM give by:    Date Additional Medicare IM Given:    Additional Medicare Important Message give by:     If discussed at [place] of Stay Meetings, dates discussed:    Additional Comments:  Joylene Draft, RN 06/19/2015, 12:02 PM

## 2015-06-19 NOTE — Progress Notes (Signed)
Continued efforts to secure inpatient psychiatric placement.  Referred to the following with possible bed availability today: [facility]- per [place] per [facility]- per [facility]- per [facility]  Declined: [facility]- per [facility] only able to accept ptswith Alzheimer's/dementia dx, does not treat mood disorders  Also referred to [facility] but per [facility], no beds currently.   Sharren Bridge, MSW, LCSW Clinical Social Work, Disposition  06/19/2015 [phone]

## 2015-06-19 NOTE — Clinical Social Work Note (Signed)
Discussed with disposition CSW who continues inpatient bed search. Will continue to follow.  Benay Pike, [place]

## 2015-06-19 NOTE — Clinical Social Work Note (Signed)
CSW notified pt that she has been accepted to [facility]. She agrees to go for voluntary admission and understands that [facility] will transport. Pt indicates that there is no one to call right now regarding disposition. Information given to RN for report.   Benay Pike, [place]

## 2015-06-19 NOTE — Progress Notes (Signed)
Patient has been accepted to [facility] bed 157-2 by Dr. Launa Grill. Per Pamala Hurry, intake RN, pt accepted for voluntary admission as per MD note, pt agreeable to treatment. Report number for RN is [phone]. [facility] admissions occur until 6am-11pm daily, pt will need to arrive within those hours). Spoke with [facility] CSW re: pt's placement at [place], MSW, LCSW Clinical Social Work, Disposition  06/19/2015 [phone]

## 2015-07-04 ENCOUNTER — Encounter (HOSPITAL_COMMUNITY): Payer: Self-pay

## 2015-07-04 ENCOUNTER — Emergency Department (HOSPITAL_COMMUNITY): Payer: Medicare Other

## 2015-07-04 ENCOUNTER — Emergency Department (HOSPITAL_COMMUNITY)
Admission: EM | Admit: 2015-07-04 | Discharge: 2015-07-05 | Disposition: A | Discharge status: Home / Self Care | Payer: Medicare Other | Attending: Emergency Medicine | Admitting: Emergency Medicine

## 2015-07-04 DIAGNOSIS — Y929 Unspecified place or not applicable: Secondary | ICD-10-CM | POA: Insufficient documentation

## 2015-07-04 DIAGNOSIS — J449 Chronic obstructive pulmonary disease, unspecified: Secondary | ICD-10-CM | POA: Diagnosis not present

## 2015-07-04 DIAGNOSIS — S8011XA Contusion of right lower leg, initial encounter: Secondary | ICD-10-CM | POA: Diagnosis not present

## 2015-07-04 DIAGNOSIS — M797 Fibromyalgia: Secondary | ICD-10-CM | POA: Diagnosis not present

## 2015-07-04 DIAGNOSIS — Z85828 Personal history of other malignant neoplasm of skin: Secondary | ICD-10-CM | POA: Diagnosis not present

## 2015-07-04 DIAGNOSIS — J45909 Unspecified asthma, uncomplicated: Secondary | ICD-10-CM | POA: Insufficient documentation

## 2015-07-04 DIAGNOSIS — K219 Gastro-esophageal reflux disease without esophagitis: Secondary | ICD-10-CM | POA: Diagnosis not present

## 2015-07-04 DIAGNOSIS — Z9889 Other specified postprocedural states: Secondary | ICD-10-CM | POA: Insufficient documentation

## 2015-07-04 DIAGNOSIS — G40909 Epilepsy, unspecified, not intractable, without status epilepticus: Secondary | ICD-10-CM | POA: Insufficient documentation

## 2015-07-04 DIAGNOSIS — W1839XA Other fall on same level, initial encounter: Secondary | ICD-10-CM | POA: Insufficient documentation

## 2015-07-04 DIAGNOSIS — F419 Anxiety disorder, unspecified: Secondary | ICD-10-CM | POA: Diagnosis not present

## 2015-07-04 DIAGNOSIS — S8991XA Unspecified injury of right lower leg, initial encounter: Secondary | ICD-10-CM | POA: Diagnosis present

## 2015-07-04 DIAGNOSIS — F319 Bipolar disorder, unspecified: Secondary | ICD-10-CM | POA: Insufficient documentation

## 2015-07-04 DIAGNOSIS — Z872 Personal history of diseases of the skin and subcutaneous tissue: Secondary | ICD-10-CM | POA: Diagnosis not present

## 2015-07-04 DIAGNOSIS — Z7951 Long term (current) use of inhaled steroids: Secondary | ICD-10-CM | POA: Insufficient documentation

## 2015-07-04 DIAGNOSIS — Z880 Allergy status to penicillin: Secondary | ICD-10-CM | POA: Diagnosis not present

## 2015-07-04 DIAGNOSIS — Y939 Activity, unspecified: Secondary | ICD-10-CM | POA: Diagnosis not present

## 2015-07-04 DIAGNOSIS — I2510 Atherosclerotic heart disease of native coronary artery without angina pectoris: Secondary | ICD-10-CM | POA: Diagnosis not present

## 2015-07-04 DIAGNOSIS — Z720 Tobacco use: Secondary | ICD-10-CM | POA: Insufficient documentation

## 2015-07-04 DIAGNOSIS — Z7982 Long term (current) use of aspirin: Secondary | ICD-10-CM | POA: Diagnosis not present

## 2015-07-04 DIAGNOSIS — E669 Obesity, unspecified: Secondary | ICD-10-CM | POA: Insufficient documentation

## 2015-07-04 DIAGNOSIS — I10 Essential (primary) hypertension: Secondary | ICD-10-CM | POA: Diagnosis not present

## 2015-07-04 DIAGNOSIS — Z793 Long term (current) use of hormonal contraceptives: Secondary | ICD-10-CM | POA: Insufficient documentation

## 2015-07-04 DIAGNOSIS — Z91040 Latex allergy status: Secondary | ICD-10-CM | POA: Insufficient documentation

## 2015-07-04 DIAGNOSIS — E785 Hyperlipidemia, unspecified: Secondary | ICD-10-CM | POA: Diagnosis not present

## 2015-07-04 DIAGNOSIS — E119 Type 2 diabetes mellitus without complications: Secondary | ICD-10-CM | POA: Diagnosis not present

## 2015-07-04 DIAGNOSIS — Z862 Personal history of diseases of the blood and blood-forming organs and certain disorders involving the immune mechanism: Secondary | ICD-10-CM | POA: Diagnosis not present

## 2015-07-04 DIAGNOSIS — Z7952 Long term (current) use of systemic steroids: Secondary | ICD-10-CM | POA: Insufficient documentation

## 2015-07-04 DIAGNOSIS — G8929 Other chronic pain: Secondary | ICD-10-CM | POA: Diagnosis not present

## 2015-07-04 DIAGNOSIS — Z79899 Other long term (current) drug therapy: Secondary | ICD-10-CM | POA: Insufficient documentation

## 2015-07-04 DIAGNOSIS — Y998 Other external cause status: Secondary | ICD-10-CM | POA: Diagnosis not present

## 2015-07-04 DIAGNOSIS — Z87448 Personal history of other diseases of urinary system: Secondary | ICD-10-CM | POA: Insufficient documentation

## 2015-07-04 NOTE — ED Provider Notes (Signed)
CSN: [id number]     Arrival date & time 07/04/15  1905 History   First MD Initiated Contact with Patient 07/04/15 1917     Chief Complaint  Patient presents with  . Leg Pain     (Consider location/radiation/quality/duration/timing/severity/associated sxs/prior Treatment) Patient is a 69 y.o. female presenting with leg pain. The history is provided by the patient (Patient states she fell and she hurt her right lower leg.).  Leg Pain Location:  Leg Leg location:  R leg Pain details:    Quality:  Aching   Radiates to:  Does not radiate   Severity:  Moderate   Onset quality:  Sudden   Timing:  Constant Associated symptoms: no back pain and no fatigue     Past Medical History  Diagnosis Date  . Obesity   . Seizure ([place])   . Skin cancer   . Asthma   . GERD (gastroesophageal reflux disease)   . Hypertension   . Tobacco user   . CAD (coronary artery disease)     Moderate disease of the left circumflex managed medically  . Fibromyalgia   . Chronic pain   . Diverticul disease small and large intestine, no perforati or abscess   . Gastroparesis     abnl gastric emptying scan 2010 (Dr. Deatra Ina)  . Anxiety   . Hyperlipidemia   . Urge and stress incontinence   . Chronic cystitis   . Rectal prolapse   . Neurotic excoriations 01/28/2013  . Leukocytosis     chronic, mild, s/p eval 03/2013 by hematologist --reassured; likely secondary to ongoing smoking  . Type II or unspecified type diabetes mellitus with neurological manifestations, not stated as uncontrolled(250.60) 08/17/2008    Hx of diabetic foot ulcer.  . Bipolar disorder ([place])     Psych hosp admission 08/2012--manic episode.  Involuntary committment 10/12/14-manic/psychotic.  Marland Kitchen COPD (chronic obstructive pulmonary disease) ([place])   . Right knee pain Summer 2014    MRI 04/2013: large popliteal fossa ganglion cyst, mod medial compartment and patellofemoral osteoarth--referred to [facility] at that time (Dr. Berenice Primas)  . Muscle  spasm   . DDD (degenerative disc disease), cervical   . DDD (degenerative disc disease), lumbar     MRI 12/2013: right foraminal disc protrusion L4-5, mild facet hypertrophy L5-S1--gets ESI's by Dr. Vira Blanco   Past Surgical History  Procedure Laterality Date  . Appendectomy    . Abdominal hysterectomy    . Tubal ligation    . Vagiocele    . Bladder surgery    . Eye surgery    . Rectocele repair    . Cholecystectomy    . Right foot surgery    . Cosmetic ear surgery    . Anterior cervical decomp/discectomy fusion      C5-C7 levels  . Esophagogastroduodenoscopy  05/2011    Dr. Vashti Hey at [facility]--s/p dilatation, o/w normal exam.  . Colonoscopy w/ biopsies  05/2009    Dr. Frederick Peers (bx showed benign colonic mucosa)  . Colonoscopy w/ biopsies  09/2002    Dr. Frederick Peers (  . Cardiovascular stress test  2002; 12/2008; 03/2012    2002: adenosine myoview NORMAL.  2010-Dr. Crenshaw: NORMAL adenosine myoview, no sign of scar or ischemia, EF normal.  2013-Dr. Nishan: NORMAL Lexiscan stress test, normal LV fxn/EF, normal wall motion  . Cardiac catheterization  08/2007    nonobstructive dz except 75% obstruction in OM, EF 60%--med mgmt   Family History  Problem Relation Age of Onset  . Cancer Mother   .  Heart attack Sister   . Cancer Sister   . Asthma Sister   . Diabetes Brother   . Heart attack Brother   . Asthma Brother   . Heart disease Brother    Social History  Substance Use Topics  . Smoking status: Current Every Day Smoker -- 1.50 packs/day    Types: Cigarettes  . Smokeless tobacco: Never Used     Comment: Using E-cigarette also.  Marland Kitchen Alcohol Use: No   OB History    No data available     Review of Systems  Constitutional: Negative for appetite change and fatigue.  HENT: Negative for congestion, ear discharge and sinus pressure.   Eyes: Negative for discharge.  Respiratory: Negative for cough.   Cardiovascular: Negative for chest pain.   Gastrointestinal: Negative for abdominal pain and diarrhea.  Genitourinary: Negative for frequency and hematuria.  Musculoskeletal: Negative for back pain.       Pain in right lower leg  Skin: Negative for rash.  Neurological: Negative for seizures and headaches.  Psychiatric/Behavioral: Negative for hallucinations.      Allergies  Dust mite extract; Januvia; Latex; Metformin; Metoclopramide hcl; Penicillins; and Sulfonamide derivatives  Home Medications   Prior to Admission medications   Medication Sig Start Date End Date Taking? Authorizing Provider  albuterol (PROVENTIL) (2.5 MG/3ML) 0.083% nebulizer solution Take 3 mLs (2.5 mg total) by nebulization every 6 (six) hours as needed for wheezing. 07/25/14  Yes Kerrie Buffalo, NP  albuterol-ipratropium (COMBIVENT) 18-103 MCG/ACT inhaler Inhale 1-2 puffs into the lungs every 6 (six) hours as needed for wheezing or shortness of breath.  06/28/15  Yes Historical Provider, MD  amLODipine (NORVASC) 10 MG tablet Take 10 mg by mouth daily.  06/28/15  Yes Historical Provider, MD  aspirin 81 MG EC tablet Take 81 mg by mouth daily.  06/28/15  Yes Historical Provider, MD  benazepril (LOTENSIN) 20 MG tablet TAKE ONE TABLET BY MOUTH DAILY FOR HIGH BLOOD PRESSURE. 06/28/15  Yes Historical Provider, MD  benztropine (COGENTIN) 0.5 MG tablet Take 1 tablet (0.5 mg total) by mouth 2 (two) times daily. 06/19/15  Yes Samuella Cota, MD  carvedilol (COREG) 12.5 MG tablet Take 12.5 mg by mouth 2 (two) times daily with a meal.  04/10/15  Yes Historical Provider, MD  clonazePAM (KLONOPIN) 1 MG tablet Take 1 mg by mouth 3 (three) times daily. 06/28/15  Yes Historical Provider, MD  diclofenac sodium (VOLTAREN) 1 % GEL Apply 2g to affected joint up to 4 times daily as needed Patient taking differently: Apply 2 g topically 4 (four) times daily as needed. Apply 2g to affected joint up to 4 times daily as needed 08/30/14  Yes Tammi Sou, MD  DULoxetine  (CYMBALTA) 60 MG capsule TAKE ONE CAPSULE BY MOUTH DAILY FOR ANXIETY AND DEPRESSION. 01/04/15  Yes Tammi Sou, MD  estradiol (ESTRACE) 0.1 MG/GM vaginal cream Insert one applicatorful vaginally once daily 06/28/15  Yes Historical Provider, MD  fexofenadine (ALLEGRA) 60 MG tablet take one tablet by mouth twice daily as needed for nasal allergies 06/28/15  Yes Historical Provider, MD  fish oil-omega-3 fatty acids 1000 MG capsule Take 2 capsules (2 g total) by mouth daily. For lipid control. 08/15/12  Yes Milta Deiters T Mashburn, PA-C  fluticasone (CUTIVATE) 0.05 % cream Apply 1 application topically 2 (two) times daily.  06/12/15  Yes Historical Provider, MD  gabapentin (NEURONTIN) 300 MG capsule Take 600 mg by mouth 3 (three) times daily.  06/28/15 07/28/15 Yes Historical Provider,  MD  lamoTRIgine (LAMICTAL) 25 MG tablet TAKE 1 TABLET BY MOUTH TWICE A DAY. 10/18/14  Yes Tammi Sou, MD  lidocaine (LIDODERM) 5 % Place 1 patch onto the skin daily.  06/06/15  Yes Historical Provider, MD  Melatonin 3 MG TABS Take 6 mg by mouth. 06/28/15  Yes Historical Provider, MD  pravastatin (PRAVACHOL) 80 MG tablet Take 80 mg by mouth. 06/28/15  Yes Historical Provider, MD  traZODone (DESYREL) 50 MG tablet Take 50 mg by mouth. 06/28/15 07/28/15 Yes Historical Provider, MD  Vitamin D, Ergocalciferol, (DRISDOL) 50000 UNITS CAPS capsule Take 50,000 Units by mouth. 06/28/15  Yes Historical Provider, MD  glimepiride (AMARYL) 2 MG tablet Take 2 mg by mouth daily with breakfast.  04/10/15   Historical Provider, MD  metoprolol succinate (TOPROL-XL) 50 MG 24 hr tablet TAKE ONE TABLET BY MOUTH EVERY DAY WITH OR IMMEDIATELY FOLLOWING A MEAL. 09/22/14   Tammi Sou, MD  nitroGLYCERIN (NITROSTAT) 0.4 MG SL tablet Place 1 tablet (0.4 mg total) under the tongue every 5 (five) minutes as needed for chest pain. 07/25/14   Kerrie Buffalo, NP  OLANZapine zydis (ZYPREXA) 5 MG disintegrating tablet Take 1 tablet (5 mg total) by mouth at  bedtime. 06/19/15   Samuella Cota, MD  omeprazole (PRILOSEC) 20 MG capsule TAKE (1) CAPSULE BY MOUTH TWICE DAILY. 09/22/14   Tammi Sou, MD  oxyCODONE-acetaminophen (PERCOCET) 10-325 MG tablet Take 2 tablets by mouth every 8 (eight) hours as needed for pain. Then take 1 tablet extra daily for break through pain. 05/15/15   Historical Provider, MD  pravastatin (PRAVACHOL) 80 MG tablet Take 1 tablet (80 mg total) by mouth daily. 07/25/14   Kerrie Buffalo, NP  triamcinolone (NASACORT AQ) 55 MCG/ACT AERO nasal inhaler Place 2 sprays into the nose daily. 07/25/14   Kerrie Buffalo, NP  Vitamin D, Ergocalciferol, (DRISDOL) 50000 UNITS CAPS capsule Take 50,000 Units by mouth every 7 (seven) days.    Historical Provider, MD   BP 160/86 mmHg  Pulse 120  Temp(Src) 97.5 F (36.4 C) (Oral)  Resp 17  Ht 5\' 4"  (1.626 m)  Wt 160 lb (72.576 kg)  BMI 27.45 kg/m2  SpO2 96% Physical Exam  Constitutional: She is oriented to person, place, and time. She appears well-developed.  HENT:  Head: Normocephalic.  Eyes: Conjunctivae are normal.  Neck: No tracheal deviation present.  Cardiovascular:  No murmur heard. Musculoskeletal: Normal range of motion.  Mild tenderness to anterior tibia neurovascular normal  Neurological: She is oriented to person, place, and time.  Skin: Skin is warm.  Psychiatric: She has a normal mood and affect.    ED Course  Procedures (including critical care time) Labs Review Labs Reviewed - No data to display  Imaging Review Dg Tibia/fibula Right  07/04/2015  CLINICAL DATA:  Patient fell and now has right leg pain. Patient was found by EMS walking down the road. EXAM: RIGHT TIBIA AND FIBULA - 2 VIEW COMPARISON:  Right knee 01/28/2013. FINDINGS: Degenerative changes in the right knee and right ankle. No evidence of acute fracture or subluxation. No focal bone lesion or bone destruction. Bone cortex and trabecular architecture appear intact. No radiopaque soft tissue  foreign bodies. IMPRESSION: No acute bony abnormalities. Electronically Signed   By: Lucienne Capers M.D.   On: 07/04/2015 19:57   I have personally reviewed and evaluated these images and lab results as part of my medical decision-making.   EKG Interpretation None      MDM  Final diagnoses:  Contusion of leg, right, initial encounter    Contusion right lower leg.  Pt to follow up with pcp    Milton Ferguson, MD 07/04/15 2016

## 2015-07-04 NOTE — ED Notes (Signed)
Pt states she does not have anyone she can call to pick her up.  When questioned about how she will get home, pt states "I'll walk home"

## 2015-07-04 NOTE — ED Notes (Signed)
Patient reports of falling and now having right leg pain. Patient unable to tell writer how she fell. EMS patient was found walking down the road. No obvious injuries noted.

## 2015-07-04 NOTE — Discharge Instructions (Signed)
Take your pain meds as needed and follow up with your md if not improving

## 2015-07-04 NOTE — ED Notes (Signed)
Attempted to make contact with someone to pick pt up to take her home.  Message left with [employer] for the [employer].  No other family member or contact available per patient.

## 2015-07-05 NOTE — ED Notes (Signed)
Pt lying awake in stretcher at this time.

## 2015-07-05 NOTE — ED Notes (Signed)
Pt given breakfast tray

## 2015-07-05 NOTE — ED Notes (Signed)
Pt given warm blanket,  

## 2015-07-05 NOTE — ED Notes (Signed)
Called central cab for pt.

## 2015-07-11 ENCOUNTER — Emergency Department (HOSPITAL_COMMUNITY)
Admission: EM | Admit: 2015-07-11 | Discharge: 2015-07-11 | Disposition: A | Discharge status: Home / Self Care | Payer: Medicare Other | Attending: Emergency Medicine | Admitting: Emergency Medicine

## 2015-07-11 ENCOUNTER — Encounter (HOSPITAL_COMMUNITY): Payer: Self-pay | Admitting: *Deleted

## 2015-07-11 ENCOUNTER — Emergency Department (HOSPITAL_COMMUNITY): Payer: Medicare Other

## 2015-07-11 DIAGNOSIS — Z720 Tobacco use: Secondary | ICD-10-CM | POA: Insufficient documentation

## 2015-07-11 DIAGNOSIS — W01198A Fall on same level from slipping, tripping and stumbling with subsequent striking against other object, initial encounter: Secondary | ICD-10-CM | POA: Diagnosis not present

## 2015-07-11 DIAGNOSIS — E119 Type 2 diabetes mellitus without complications: Secondary | ICD-10-CM | POA: Insufficient documentation

## 2015-07-11 DIAGNOSIS — Z793 Long term (current) use of hormonal contraceptives: Secondary | ICD-10-CM | POA: Insufficient documentation

## 2015-07-11 DIAGNOSIS — Z79899 Other long term (current) drug therapy: Secondary | ICD-10-CM | POA: Diagnosis not present

## 2015-07-11 DIAGNOSIS — J029 Acute pharyngitis, unspecified: Secondary | ICD-10-CM | POA: Diagnosis present

## 2015-07-11 DIAGNOSIS — Z91040 Latex allergy status: Secondary | ICD-10-CM | POA: Insufficient documentation

## 2015-07-11 DIAGNOSIS — J449 Chronic obstructive pulmonary disease, unspecified: Secondary | ICD-10-CM | POA: Insufficient documentation

## 2015-07-11 DIAGNOSIS — M797 Fibromyalgia: Secondary | ICD-10-CM | POA: Diagnosis not present

## 2015-07-11 DIAGNOSIS — E785 Hyperlipidemia, unspecified: Secondary | ICD-10-CM | POA: Insufficient documentation

## 2015-07-11 DIAGNOSIS — Y9389 Activity, other specified: Secondary | ICD-10-CM | POA: Diagnosis not present

## 2015-07-11 DIAGNOSIS — E669 Obesity, unspecified: Secondary | ICD-10-CM | POA: Diagnosis not present

## 2015-07-11 DIAGNOSIS — Z85828 Personal history of other malignant neoplasm of skin: Secondary | ICD-10-CM | POA: Insufficient documentation

## 2015-07-11 DIAGNOSIS — M25561 Pain in right knee: Secondary | ICD-10-CM

## 2015-07-11 DIAGNOSIS — Z7951 Long term (current) use of inhaled steroids: Secondary | ICD-10-CM | POA: Insufficient documentation

## 2015-07-11 DIAGNOSIS — S8991XA Unspecified injury of right lower leg, initial encounter: Secondary | ICD-10-CM | POA: Diagnosis not present

## 2015-07-11 DIAGNOSIS — Y9289 Other specified places as the place of occurrence of the external cause: Secondary | ICD-10-CM | POA: Diagnosis not present

## 2015-07-11 DIAGNOSIS — I2510 Atherosclerotic heart disease of native coronary artery without angina pectoris: Secondary | ICD-10-CM | POA: Insufficient documentation

## 2015-07-11 DIAGNOSIS — J069 Acute upper respiratory infection, unspecified: Secondary | ICD-10-CM | POA: Diagnosis not present

## 2015-07-11 DIAGNOSIS — F419 Anxiety disorder, unspecified: Secondary | ICD-10-CM | POA: Insufficient documentation

## 2015-07-11 DIAGNOSIS — Y998 Other external cause status: Secondary | ICD-10-CM | POA: Diagnosis not present

## 2015-07-11 DIAGNOSIS — Z880 Allergy status to penicillin: Secondary | ICD-10-CM | POA: Diagnosis not present

## 2015-07-11 DIAGNOSIS — R079 Chest pain, unspecified: Secondary | ICD-10-CM | POA: Diagnosis not present

## 2015-07-11 DIAGNOSIS — F319 Bipolar disorder, unspecified: Secondary | ICD-10-CM | POA: Insufficient documentation

## 2015-07-11 DIAGNOSIS — G8929 Other chronic pain: Secondary | ICD-10-CM | POA: Insufficient documentation

## 2015-07-11 DIAGNOSIS — I10 Essential (primary) hypertension: Secondary | ICD-10-CM | POA: Insufficient documentation

## 2015-07-11 LAB — BASIC METABOLIC PANEL
Anion gap: 8 (ref 5–15)
BUN: 7 mg/dL (ref 6–20)
CO2: 29 mmol/L (ref 22–32)
Calcium: 9.3 mg/dL (ref 8.9–10.3)
Chloride: 102 mmol/L (ref 101–111)
Creatinine, Ser: 0.68 mg/dL (ref 0.44–1.00)
GFR calc Af Amer: >60 mL/min (ref >60)
GFR calc non Af Amer: >60 mL/min (ref >60)
Glucose, Bld: 106 mg/dL — ABNORMAL HIGH (ref 65–99)
Potassium: 3.9 mmol/L (ref 3.5–5.1)
Sodium: 139 mmol/L (ref 135–145)

## 2015-07-11 LAB — CBC
HEMATOCRIT: 42.6 % (ref 36.0–46.0)
HEMOGLOBIN: 14.1 g/dL (ref 12.0–15.0)
MCH: 28.3 pg (ref 26.0–34.0)
MCHC: 33.1 g/dL (ref 30.0–36.0)
MCV: 85.4 fL (ref 78.0–100.0)
Platelets: 371 10*3/uL (ref 150–400)
RBC: 4.99 MIL/uL (ref 3.87–5.11)
RDW: 15.5 % (ref 11.5–15.5)
WBC: 10.6 10*3/uL — ABNORMAL HIGH (ref 4.0–10.5)

## 2015-07-11 LAB — TROPONIN I: Troponin I: <0.03 ng/mL (ref <0.031)

## 2015-07-11 LAB — RAPID STREP SCREEN (MED CTR MEBANE ONLY): Streptococcus, Group A Screen (Direct): NEGATIVE

## 2015-07-11 NOTE — Discharge Instructions (Signed)
°Emergency Department Resource Guide °1) Find a Doctor and Pay Out of Pocket °Although you won't have to find out who is covered by your insurance plan, it is a good idea to ask around and get recommendations. You will then need to call the office and see if the doctor you have chosen will accept you as a new patient and what types of options they offer for patients who are self-pay. Some doctors offer discounts or will set up payment plans for their patients who do not have insurance, but you will need to ask so you aren't surprised when you get to your appointment. ° °2) Contact Your Local Health Department °Not all health departments have doctors that can see patients for sick visits, but many do, so it is worth a call to see if yours does. If you don't know where your local health department is, you can check in your phone book. The CDC also has a tool to help you locate your state's health department, and many state websites also have listings of all of their local health departments. ° °3) Find a Walk-in Clinic °If your illness is not likely to be very severe or complicated, you may want to try a walk in clinic. These are popping up all over the country in pharmacies, drugstores, and shopping centers. They're usually staffed by nurse practitioners or physician assistants that have been trained to treat common illnesses and complaints. They're usually fairly quick and inexpensive. However, if you have serious medical issues or chronic medical problems, these are probably not your best option. ° °No Primary Care Doctor: °- Call [facility] at  832-8000 - they can help you locate a primary care doctor that  accepts your insurance, provides certain services, etc. °- Physician Referral Service- 1-800-533-3463 ° °Chronic Pain Problems: °Organization         Address  Phone   Notes  °[facility]  (336) [phone] Patients need to be referred by their primary care doctor.  ° °Medication  Assistance: °Organization         Address  Phone   Notes  °[place] [facility] [address] °[place], [place] [address] (336) 641-8030 --Must be a resident of [place] °-- Must have NO insurance coverage whatsoever (no Medicaid/ Medicare, etc.) °-- The pt. MUST have a primary care doctor that directs their care regularly and follows them in the community °  °[facility]  (866) 331-1348   °[employer]  (888) 892-1162   ° °Agencies that provide inexpensive medical care: °Organization         Address  Phone   Notes  °[facility]  (336) 832-8035   °[facility]    (336) 832-7272   °[facility] [address] °[place], [place] [address] (336) 832-4777   °[facility] [address], °[place] (336) 271-4999   °[facility]    (336) 373-0678   °[facility]    (336) 272-1050   °[facility] ° [address],  Phone:  (336) 832-4444, Fax:  (336) 832-4440 Hours of Operation:  9 am - 6 pm, M-F.  Also accepts Medicaid/Medicare and self-pay.  °[facility] ° [address],  Phone: (336) 832-3150, Fax: (336) [phone]. Hours of Operation:  8:30 am - 5:30 pm, M-F.  Also accepts Medicaid and self-pay.  °[facility] [place] [address], [place] Phone: (336) 878-6027   °[facility] [address], [place], [place] (336)723-1848, Ext. 123 Mondays & Thursdays: 7-9 AM.  First 15 patients are seen on a first come, first serve basis. °  ° °Medicaid-accepting [place] Providers: ° °Organization         Address  Phone   Notes  °[facility] [address], [place] (336) [phone] Also accepts self-pay patients.  °[facility] [address], [place] ° (336) 856-9996   °[facility] [address], [place]  (336) 288-8857   °[facility] [address] [place] [address], [place] (336) 299-7000   °Veita Bland [address], [place]  ° (336) 373-1557 Only accepts [place] Access Medicaid patients after they have their name applied to their card.  ° °Self-Pay (no insurance) in [place]: ° °Organization         Address  Phone   Notes  °Sickle Cell Patients, [facility] [address], [place] (336) 832-1970   °[facility] [address], [place] (336) 832-4400   °[facility] ° [address], [place] (336) 992-4800   °[facility]/Dr. Osei-Bonsu ° [address] [place] [address], [place] or [address], [place] (336) [phone] Phone number for both [place] and [place] locations is the same.  °[facility] [address], [place] (336) 299-0000   °[facility] [address] [place] [address], [place] or [address] (336) 852-7530 °(336) 878-2260   °[facility] [address], [place] (336) 350-1642, phone; (336) 294-5005, fax Sees patients 1st and 3rd Saturday of every month.  Must not qualify for public or private insurance (i.e. Medicaid, Medicare, [place] Health Choice, Veterans' Benefits) • Household income should be no more than 200% of the poverty level •The clinic cannot treat you if you are pregnant or think you are pregnant • Sexually transmitted diseases are not treated at the clinic.  ° ° °Dental Care: °Organization         Address  Phone  Notes  °[place] [facility] [address], [place] (336) 641-6152 Accepts children up to age 21 who are enrolled in Medicaid or [place] Health Choice; pregnant women with a Medicaid card; and children who have applied for Medicaid or [place] Health Choice, but were declined, whose parents can pay a reduced fee at time of service.  °[place]  [facility] [place]  [address], [place] (336) 641-7733 Accepts children up to age 21 who are enrolled in Medicaid or [place] Health Choice; pregnant women with a Medicaid card; and children who have applied for Medicaid or [place] Health Choice, but were declined, whose parents can pay a reduced fee at time of service.  °[facility] ° [address], [place] (336) 641-4533 Patients are seen by appointment only. Walk-ins are not accepted. [facility] will see patients 18 years of age and older. °Monday - Tuesday (8am-5pm) °Most Wednesdays (8:30-5pm) °$30 per visit, cash only  °[facility] ° [address], [place] (336) 641-4533 Patients are seen by appointment only. Walk-ins are not accepted. [facility] will see patients 18 years of age and older. °One   Wednesday Evening (Monthly: Volunteer Based).  $30 per visit, cash only  °[facility]  (919) [phone] for adults; Children under age 4, call Graduate Pediatric Dentistry at ([phone]) [phone]. Children aged 4-14, please call ([phone]) [phone] to request a pediatric application. ° Dental services are provided in all areas of dental care including fillings, crowns and bridges, complete and partial dentures, implants, gum treatment, root canals, and extractions. Preventive care is also provided. Treatment is provided to both adults and children. °Patients are selected via a lottery and there is often a waiting list. °  °[facility] [address], °[place] ° (336) 763-8833 [email or web address] °  °[facility] [address], [place], [place] (336)723-1848, Ext. 123 Second and Fourth Thursday of each month, opens at 6:30 AM; Clinic ends at 9 AM.  Patients are seen on a first-come first-served basis, and a limited number are seen during each clinic.  ° °[facility] ° [address], [place], [place] (336) 723-7904    Eligibility Requirements °You must have lived in [place], [place], or [place] counties for at least the last three months. °  You cannot be eligible for state or federal sponsored healthcare insurance, including Veterans Administration, Medicaid, or Medicare. °  You generally cannot be eligible for healthcare insurance through your employer.  °  How to apply: °Eligibility screenings are held every Tuesday and Wednesday afternoon from 1:00 pm until 4:00 pm. You do not need an appointment for the interview!  °[facility] [address], [place], [place] 336-631-2330   °[facility]  336-342-8273   °[facility]  336-703-3100   °[facility]  336-570-6415   ° °Behavioral Health Resources in the Community: °Intensive Outpatient Programs °Organization         Address  Phone  Notes  °[facility] [address], [place], [place] 336-878-6098   °[facility] [address], [place], [place] 336-832-9800   °[facility] [address], [place], [place] ° 336-882-2125   °[facility] [address],  °[place], [place] 1-800-853-5163 or 336-641-4981   °Substance Abuse Resources °Organization         Address  Phone  Notes  °[facility]  336-882-2125   °[facility]  336-784-9470   °[facility]  336-285-9073   °[facility]  336-845-3988   °Residential & Outpatient Substance Abuse Program  1-800-659-3381   °Psychological Services °Organization         Address  Phone  Notes  °[facility]  336- 832-9600   °[facility]  336- 378-7881   °[facility] [address], [place] 1-800-853-5163 or 336-641-4981   ° °Mobile Crisis Teams °Organization         Address  Phone  Notes  °[facility], Mobile Crisis Care Unit  1-877-626-1772   °Assertive °[facility] ° [address]  [place], [place] 336-834-9664   °Sharon DeEsch [address] °[place] 336-554-5454   ° °Self-Help/Support Groups °Organization         Address  Phone             Notes  °[facility] - variety of support groups  336- 373-1402 Call for more information  °Narcotics Anonymous (NA), [facility] [address], °[place]  2 meetings at this location  ° °  Residential Treatment Programs Organization         Address  Phone  Notes  [facility] [address],    [place]  1-(234)369-4961   [facility]  [address], [place] [id number], [place], [place]   [place], [place] (253)386-6848 Admissions: 8am-3pm M-F  [facility] [place] [address],    [place], [place] 270-350-0938   [facility] [address] [place], [place], [place]   [facility] [address]  [place], [place]   [facility] Intensive Outpatient [address], [place], [place]   [facility] ([place].,  [place], [place] 1-8727372055 or 867-760-1411   [facility] [address], [place], [place] Accepts Medicaid  [facility] [address]  [place] 1-(551) 642-8564 Substance Abuse/Addiction Treatment   [facility]         Address  Phone  Notes  [facility]  (762)062-7710   Domenic Schwab, PhD [address] Arlis Porta [place], [place]   (772)701-0724 or 773-572-0278   [facility], [place] 320-102-4003   [facility] [address] [place], [place] (334) 702-5438 Insurance/Medicaid/sponsorship through [facility] [address]                                    [place], [place] 205-858-8250 [place] [address] [place], [place] (210) [phone]    Dr. Adele Schilder  ([phone]   [facility] Dept. 1) [address], [place] 2) [place] 3)  [address], [place] [phone]  5796457391   [place] [phone] or [phone] (After Hours)      Take over the counter decongestant, as directed on packaging, for the next week.  Use over the counter normal saline nasal spray, as instructed in the Emergency Department, several times per day for the next 2 weeks. Take the prescriptions as directed.  Apply moist heat or ice to the area(s) of discomfort, for 15 minutes at a time, several times per day for the next few days.  Do not fall asleep on a heating or ice pack.   Call your regular medical doctor tomorrow to schedule a follow up appointment in the next 2 days to pick up your blood pressure prescription refill.  Return to the Emergency Department immediately if worsening.

## 2015-07-11 NOTE — ED Provider Notes (Signed)
CSN: [id number]     Arrival date & time 07/11/15  1627 History   First MD Initiated Contact with Patient 07/11/15 1936     Chief Complaint  Patient presents with  . Chest Pain  . Sinus Problem  . Sore Throat      HPI Pt was seen at [facility].  Per pt, c/o gradual onset and persistence of constant sore throat, runny/stuffy nose, sinus congestion, and cough for the past 1 week.  Pt also states she has had constant generalized chest "pain" for the past 1 week. States she ran out of her BP meds, and her PMD "will fill them but says I have to go to the office and be seen." Denies fevers, no rash, no SOB, no palpitations, no N/V/D, no abd pain. Pt also states she "fell and hit my knee today," and is requesting "an xray of my knee." Denies hip pain, no ankle pain, no foot pain, no LBP, no focal motor weakness, no tingling/numbness in extremities, no head injury.     Past Medical History  Diagnosis Date  . Obesity   . Seizure ([place])   . Skin cancer   . Asthma   . GERD (gastroesophageal reflux disease)   . Hypertension   . Tobacco user   . CAD (coronary artery disease)     Moderate disease of the left circumflex managed medically  . Fibromyalgia   . Chronic pain   . Diverticul disease small and large intestine, no perforati or abscess   . Gastroparesis     abnl gastric emptying scan 2010 (Dr. Deatra Ina)  . Anxiety   . Hyperlipidemia   . Urge and stress incontinence   . Chronic cystitis   . Rectal prolapse   . Neurotic excoriations 01/28/2013  . Leukocytosis     chronic, mild, s/p eval 03/2013 by hematologist --reassured; likely secondary to ongoing smoking  . Type II or unspecified type diabetes mellitus with neurological manifestations, not stated as uncontrolled(250.60) 08/17/2008    Hx of diabetic foot ulcer.  . Bipolar disorder ([place])     Psych hosp admission 08/2012--manic episode.  Involuntary committment 10/12/14-manic/psychotic.  Marland Kitchen COPD (chronic obstructive pulmonary disease) ([place])   .  Right knee pain Summer 2014    MRI 04/2013: large popliteal fossa ganglion cyst, mod medial compartment and patellofemoral osteoarth--referred to [facility] at that time (Dr. Berenice Primas)  . Muscle spasm   . DDD (degenerative disc disease), cervical   . DDD (degenerative disc disease), lumbar     MRI 12/2013: right foraminal disc protrusion L4-5, mild facet hypertrophy L5-S1--gets ESI's by Dr. Vira Blanco   Past Surgical History  Procedure Laterality Date  . Appendectomy    . Abdominal hysterectomy    . Tubal ligation    . Vagiocele    . Bladder surgery    . Eye surgery    . Rectocele repair    . Cholecystectomy    . Right foot surgery    . Cosmetic ear surgery    . Anterior cervical decomp/discectomy fusion      C5-C7 levels  . Esophagogastroduodenoscopy  05/2011    Dr. Vashti Hey at [facility]--s/p dilatation, o/w normal exam.  . Colonoscopy w/ biopsies  05/2009    Dr. Frederick Peers (bx showed benign colonic mucosa)  . Colonoscopy w/ biopsies  09/2002    Dr. Frederick Peers (  . Cardiovascular stress test  2002; 12/2008; 03/2012    2002: adenosine myoview NORMAL.  2010-Dr. Crenshaw: NORMAL adenosine myoview, no sign of scar  or ischemia, EF normal.  2013-Dr. Nishan: NORMAL Lexiscan stress test, normal LV fxn/EF, normal wall motion  . Cardiac catheterization  08/2007    nonobstructive dz except 75% obstruction in OM, EF 60%--med mgmt   Family History  Problem Relation Age of Onset  . Cancer Mother   . Heart attack Sister   . Cancer Sister   . Asthma Sister   . Diabetes Brother   . Heart attack Brother   . Asthma Brother   . Heart disease Brother    Social History  Substance Use Topics  . Smoking status: Current Every Day Smoker -- 1.50 packs/day    Types: Cigarettes  . Smokeless tobacco: Never Used     Comment: Using E-cigarette also.  Marland Kitchen Alcohol Use: No    Review of Systems ROS: Statement: All systems negative except as marked or noted in the HPI;  Constitutional: Negative for fever and chills. ; ; Eyes: Negative for eye pain, redness and discharge. ; ; ENMT: Negative for ear pain, hoarseness, +nasal congestion, sinus pressure and sore throat. ; ; Cardiovascular: Negative for palpitations, diaphoresis, dyspnea and peripheral edema. ; ; Respiratory: Negative for cough, wheezing and stridor. ; ; Gastrointestinal: Negative for nausea, vomiting, diarrhea, abdominal pain, blood in stool, hematemesis, jaundice and rectal bleeding. . ; ; Genitourinary: Negative for dysuria, flank pain and hematuria. ; ; Musculoskeletal: +CP, knee pain. Negative for back pain and neck pain. Negative for swelling and deformity.; ; Skin: Negative for pruritus, rash, abrasions, blisters, bruising and skin lesion.; ; Neuro: Negative for headache, lightheadedness and neck stiffness. Negative for weakness, altered level of consciousness , altered mental status, extremity weakness, paresthesias, involuntary movement, seizure and syncope.      Allergies  Dust mite extract; Januvia; Latex; Metformin; Metoclopramide hcl; Penicillins; and Sulfonamide derivatives  Home Medications   Prior to Admission medications   Medication Sig Start Date End Date Taking? Authorizing Provider  albuterol (PROVENTIL) (2.5 MG/3ML) 0.083% nebulizer solution Take 3 mLs (2.5 mg total) by nebulization every 6 (six) hours as needed for wheezing. 07/25/14   Kerrie Buffalo, NP  albuterol-ipratropium (COMBIVENT) 18-103 MCG/ACT inhaler Inhale 1-2 puffs into the lungs every 6 (six) hours as needed for wheezing or shortness of breath.  06/28/15   Historical Provider, MD  amLODipine (NORVASC) 10 MG tablet Take 10 mg by mouth daily.  06/28/15   Historical Provider, MD  aspirin 81 MG EC tablet Take 81 mg by mouth daily.  06/28/15   Historical Provider, MD  benazepril (LOTENSIN) 20 MG tablet TAKE ONE TABLET BY MOUTH DAILY FOR HIGH BLOOD PRESSURE. 06/28/15   Historical Provider, MD  benztropine (COGENTIN) 0.5 MG  tablet Take 1 tablet (0.5 mg total) by mouth 2 (two) times daily. 06/19/15   Samuella Cota, MD  carvedilol (COREG) 12.5 MG tablet Take 12.5 mg by mouth 2 (two) times daily with a meal.  04/10/15   Historical Provider, MD  clonazePAM (KLONOPIN) 1 MG tablet Take 1 mg by mouth 3 (three) times daily. 06/28/15   Historical Provider, MD  diclofenac sodium (VOLTAREN) 1 % GEL Apply 2g to affected joint up to 4 times daily as needed Patient taking differently: Apply 2 g topically 4 (four) times daily as needed. Apply 2g to affected joint up to 4 times daily as needed 08/30/14   Tammi Sou, MD  DULoxetine (CYMBALTA) 60 MG capsule TAKE ONE CAPSULE BY MOUTH DAILY FOR ANXIETY AND DEPRESSION. 01/04/15   Tammi Sou, MD  estradiol (ESTRACE) 0.1  MG/GM vaginal cream Insert one applicatorful vaginally once daily 06/28/15   Historical Provider, MD  fexofenadine (ALLEGRA) 60 MG tablet take one tablet by mouth twice daily as needed for nasal allergies 06/28/15   Historical Provider, MD  fish oil-omega-3 fatty acids 1000 MG capsule Take 2 capsules (2 g total) by mouth daily. For lipid control. 08/15/12   Ruben Im, PA-C  fluticasone (CUTIVATE) 0.05 % cream Apply 1 application topically 2 (two) times daily.  06/12/15   Historical Provider, MD  gabapentin (NEURONTIN) 300 MG capsule Take 600 mg by mouth 3 (three) times daily.  06/28/15 07/28/15  Historical Provider, MD  glimepiride (AMARYL) 2 MG tablet Take 2 mg by mouth daily with breakfast.  04/10/15   Historical Provider, MD  lamoTRIgine (LAMICTAL) 25 MG tablet TAKE 1 TABLET BY MOUTH TWICE A DAY. 10/18/14   Tammi Sou, MD  lidocaine (LIDODERM) 5 % Place 1 patch onto the skin daily.  06/06/15   Historical Provider, MD  Melatonin 3 MG TABS Take 6 mg by mouth at bedtime.  06/28/15   Historical Provider, MD  metoprolol succinate (TOPROL-XL) 50 MG 24 hr tablet TAKE ONE TABLET BY MOUTH EVERY DAY WITH OR IMMEDIATELY FOLLOWING A MEAL. 09/22/14   Tammi Sou,  MD  nitroGLYCERIN (NITROSTAT) 0.4 MG SL tablet Place 1 tablet (0.4 mg total) under the tongue every 5 (five) minutes as needed for chest pain. 07/25/14   Kerrie Buffalo, NP  OLANZapine zydis (ZYPREXA) 5 MG disintegrating tablet Take 1 tablet (5 mg total) by mouth at bedtime. 06/19/15   Samuella Cota, MD  omeprazole (PRILOSEC) 20 MG capsule TAKE (1) CAPSULE BY MOUTH TWICE DAILY. 09/22/14   Tammi Sou, MD  oxyCODONE-acetaminophen (PERCOCET) 10-325 MG tablet Take 2 tablets by mouth every 8 (eight) hours as needed for pain. Then take 1 tablet extra daily for break through pain. 05/15/15   Historical Provider, MD  pravastatin (PRAVACHOL) 80 MG tablet Take 1 tablet (80 mg total) by mouth daily. 07/25/14   Kerrie Buffalo, NP  traZODone (DESYREL) 50 MG tablet Take 50 mg by mouth at bedtime.  06/28/15 07/28/15  Historical Provider, MD  Vitamin D, Ergocalciferol, (DRISDOL) 50000 UNITS CAPS capsule Take 50,000 Units by mouth. 06/28/15   Historical Provider, MD   BP 130/80 mmHg  Pulse 99  Temp(Src) 97.5 F (36.4 C) (Oral)  Resp 16  Ht 5\' 5"  (1.651 m)  Wt 153 lb (69.4 kg)  BMI 25.46 kg/m2  SpO2 98% Physical Exam  2000: Physical examination:  Nursing notes reviewed; Vital signs and O2 SAT reviewed;  Constitutional: Well developed, Well nourished, Well hydrated, In no acute distress; Head:  Normocephalic, atraumatic; Eyes: EOMI, PERRL, No scleral icterus; ENMT: TM's clear bilat. +edemetous nasal turbinates bilat with clear rhinorrhea. Mouth and pharynx normal, Mucous membranes moist; Neck: Supple, Full range of motion, No lymphadenopathy; Cardiovascular: Regular rate and rhythm, No gallop; Respiratory: Breath sounds clear & equal bilaterally, No wheezes.  Speaking full sentences with ease, Normal respiratory effort/excursion; Chest: Nontender, Movement normal; Abdomen: Soft, Nontender, Nondistended, Normal bowel sounds; Genitourinary: No CVA tenderness; Extremities: Pulses normal, No deformity. No  edema, No calf edema or asymmetry. +FROM right knee, including able to lift extended RLE off stretcher, and extend right lower leg against resistance.  No ligamentous laxity.  No patellar or quad tendon step-offs.  NMS intact right foot, strong pedal pp. +plantarflexion of right foot w/calf squeeze.  No palpable gap right Achilles's tendon.  No proximal fibular head  tenderness.  No edema, erythema, warmth, ecchymosis or deformity.  No specific area of point tenderness. NT right hip/ankle/foot.;; Neuro: AA&Ox3, Major CN grossly intact. No facial droop. Speech clear. No gross focal motor or sensory deficits in extremities. Climbs on and off stretcher easily by herself. Gait steady.; Skin: Color normal, Warm, Dry.   ED Course  Procedures (including critical care time) Labs Review   Imaging Review  I have personally reviewed and evaluated these images and lab results as part of my medical decision-making.   EKG Interpretation   Date/Time:  Tuesday July 11 2015 16:44:04 EST Ventricular Rate:  104 PR Interval:  168 QRS Duration: 80 QT Interval:  364 QTC Calculation: 478 R Axis:   91 Text Interpretation:  Sinus tachycardia Rightward axis Borderline ECG No  significant change since last tracing Confirmed by [facility] MD, Corene Cornea  [phone]) on 07/11/2015 4:48:27 PM      MDM  MDM Reviewed: previous chart, nursing note and vitals Reviewed previous: labs and ECG Interpretation: labs, ECG and x-ray      Results for orders placed or performed during the hospital encounter of 07/11/15  Rapid strep screen  Result Value Ref Range   Streptococcus, Group A Screen (Direct) NEGATIVE NEGATIVE  Basic metabolic panel  Result Value Ref Range   Sodium 139 135 - 145 mmol/L   Potassium 3.9 3.5 - 5.1 mmol/L   Chloride 102 101 - 111 mmol/L   CO2 29 22 - 32 mmol/L   Glucose, Bld 106 (H) 65 - 99 mg/dL   BUN 7 6 - 20 mg/dL   Creatinine, Ser 0.68 0.44 - 1.00 mg/dL   Calcium 9.3 8.9 - 10.3 mg/dL   GFR  calc non Af Amer >60 >60 mL/min   GFR calc Af Amer >60 >60 mL/min   Anion gap 8 5 - 15  CBC  Result Value Ref Range   WBC 10.6 (H) 4.0 - 10.5 K/uL   RBC 4.99 3.87 - 5.11 MIL/uL   Hemoglobin 14.1 12.0 - 15.0 g/dL   HCT 42.6 36.0 - 46.0 %   MCV 85.4 78.0 - 100.0 fL   MCH 28.3 26.0 - 34.0 pg   MCHC 33.1 30.0 - 36.0 g/dL   RDW 15.5 11.5 - 15.5 %   Platelets 371 150 - 400 K/uL  Troponin I  Result Value Ref Range   Troponin I <0.03 <0.031 ng/mL    Dg Chest 2 View 07/11/2015  CLINICAL DATA:  Congestion, chest pain and shortness of breath. EXAM: CHEST - 2 VIEW COMPARISON:  07/08/2014 FINDINGS: The heart size and mediastinal contours are within normal limits. Stable focal area of scarring at the left lung base. There is no evidence of pulmonary edema, consolidation, pneumothorax, nodule or pleural fluid. Stable osteopenia and mild loss of height of a lower thoracic vertebral body. IMPRESSION: No active disease. Electronically Signed   By: Aletta Edouard M.D.   On: 07/11/2015 17:00    Dg Knee Complete 4 Views Right 07/11/2015  CLINICAL DATA:  Fall onto right knee with right knee pain. EXAM: RIGHT KNEE - COMPLETE 4+ VIEW COMPARISON:  None. FINDINGS: No acute fracture or dislocation. Mild tricompartmental osteophytes with preservation of joint spaces. Questionable intra-articular body posteriorly versus osteophyte. A fabella is present. The bones appear under mineralized. No joint effusion. 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 Electronically Signed   By: Jeb Levering M.D.   On: 07/11/2015 20:34    2100:  Workup reassuring. Doubt PE as cause  for symptoms with low risk [place].  Doubt ACS as cause for symptoms with normal troponin and unchanged EKG from previous after 1 week of constant symptoms. Pt strongly encouraged to f/u with her PMD to obtain her BP rx; pt verb understanding. Dx and testing d/w pt.  Questions answered.  Verb understanding, agreeable  to d/c home with outpt f/u.    Francine Graven, DO 07/13/15 [id number]

## 2015-07-11 NOTE — ED Notes (Signed)
Per pt she has been having chest pain in addition to her nasal congestion. Pt ran out of BP medication a week ago and began having chest pain then. Pt states she has had some dizziness.

## 2015-07-11 NOTE — ED Notes (Signed)
Pt comes in via EMS. Pt has nasal congestion and ear pain, starting 1 week ago. Denies n/v/d.

## 2015-07-11 NOTE — ED Notes (Signed)
Discharge instructions given, pt demonstrated teach back and verbal understanding. No concerns voiced.  

## 2015-07-14 LAB — CULTURE, GROUP A STREP: STREP A CULTURE: NEGATIVE

## 2015-12-06 ENCOUNTER — Other Ambulatory Visit: Payer: Self-pay | Admitting: Family Medicine

## 2015-12-18 ENCOUNTER — Other Ambulatory Visit: Payer: Self-pay | Admitting: Family Medicine

## 2016-01-03 ENCOUNTER — Other Ambulatory Visit: Payer: Self-pay | Admitting: Family Medicine

## 2016-01-09 ENCOUNTER — Other Ambulatory Visit: Payer: Self-pay | Admitting: Family Medicine

## 2016-01-12 ENCOUNTER — Other Ambulatory Visit: Payer: Self-pay | Admitting: Family Medicine

## 2016-02-13 ENCOUNTER — Other Ambulatory Visit: Payer: Self-pay | Admitting: Pain Medicine

## 2016-02-13 DIAGNOSIS — M542 Cervicalgia: Secondary | ICD-10-CM

## 2016-02-14 ENCOUNTER — Other Ambulatory Visit: Payer: Self-pay | Admitting: Family Medicine

## 2016-02-29 ENCOUNTER — Other Ambulatory Visit: Payer: Self-pay

## 2016-03-08 ENCOUNTER — Other Ambulatory Visit: Payer: Self-pay | Admitting: Family Medicine

## 2016-03-18 ENCOUNTER — Other Ambulatory Visit: Payer: Self-pay

## 2016-04-05 ENCOUNTER — Ambulatory Visit: Payer: Self-pay | Admitting: Gastroenterology

## 2016-04-05 ENCOUNTER — Telehealth: Payer: Self-pay | Admitting: Gastroenterology

## 2016-04-08 NOTE — Telephone Encounter (Signed)
Noted  

## 2016-05-07 ENCOUNTER — Ambulatory Visit: Payer: Self-pay | Admitting: Gastroenterology

## 2016-06-12 ENCOUNTER — Other Ambulatory Visit: Payer: Self-pay | Admitting: Family Medicine

## 2016-06-15 ENCOUNTER — Other Ambulatory Visit: Payer: Self-pay | Admitting: Family Medicine

## 2016-07-29 ENCOUNTER — Encounter: Payer: Self-pay | Admitting: Nurse Practitioner

## 2016-07-30 ENCOUNTER — Telehealth: Payer: Self-pay | Admitting: Emergency Medicine

## 2016-07-30 ENCOUNTER — Ambulatory Visit: Payer: Self-pay | Admitting: Nurse Practitioner

## 2016-07-30 NOTE — Telephone Encounter (Signed)
Pt wants to know if she can reestablish care with you? Please advise thanks.

## 2016-07-30 NOTE — Progress Notes (Deleted)
CARDIOLOGY OFFICE NOTE  Date:  07/30/2016    [name] Date of Birth: [birth year] Medical Record [id number]  PCP:  Lynne Logan, MD  Cardiologist:  Burt Knack  No chief complaint on file.   History of Present Illness: [name] is a 70 y.o. female who presents today for a follow up visit. Seen for Dr. Burt Knack.   Has not been here since 2013.   She has a history of history of CAD that has been managed medically, hypertension, hyperlipidemia, GERD, diabetes, chronic back pain, restless leg syndrome, anxiety and COPD.  Cardiac catheterization 08/2007: Proximal LAD 20-30%, OM to 75%, proximal RCA 20-30%, EF 60%.  Last Myoview 12/2008: No ischemia, EF 74%.  Last seen by Dr. Burt Knack 11/2010.    At her last visit - she had multiple complaints.  She had left-sided chest pain. She was lightheadedness.  She was having pain that radiates to her neck.  She was having bilateral arm pain.  She felt as though her cervical disc disease was getting worse. Smoking. Coughing. Asking for antibiotics. Myoview ordered and this looked ok.   Comes in today. Here with   Past Medical History:  Diagnosis Date  . Anxiety   . Asthma   . Bipolar disorder ([place])    Psych hosp admission 08/2012--manic episode.  Involuntary committment 10/12/14-manic/psychotic.  Marland Kitchen CAD (coronary artery disease)    Moderate disease of the left circumflex managed medically  . Chronic cystitis   . Chronic pain   . COPD (chronic obstructive pulmonary disease) ([place])   . DDD (degenerative disc disease), cervical   . DDD (degenerative disc disease), lumbar    MRI 12/2013: right foraminal disc protrusion L4-5, mild facet hypertrophy L5-S1--gets ESI's by Dr. Vira Blanco  . Diverticul disease small and large intestine, no perforati or abscess   . Fibromyalgia   . Gastroparesis    abnl gastric emptying scan 2010 (Dr. Deatra Ina)  . GERD (gastroesophageal reflux disease)   . Hyperlipidemia   . Hypertension   . Leukocytosis    chronic, mild, s/p eval 03/2013 by hematologist --reassured; likely secondary to ongoing smoking  . Muscle spasm   . Neurotic excoriations 01/28/2013  . Obesity   . Rectal prolapse   . Right knee pain Summer 2014   MRI 04/2013: large popliteal fossa ganglion cyst, mod medial compartment and patellofemoral osteoarth--referred to [facility] at that time (Dr. Berenice Primas)  . Seizure ([place])   . Skin cancer   . Tobacco user   . Type II or unspecified type diabetes mellitus with neurological manifestations, not stated as uncontrolled(250.60) 08/17/2008   Hx of diabetic foot ulcer.  . Urge and stress incontinence     Past Surgical History:  Procedure Laterality Date  . ABDOMINAL HYSTERECTOMY    . ANTERIOR CERVICAL DECOMP/DISCECTOMY FUSION     C5-C7 levels  . APPENDECTOMY    . BLADDER SURGERY    . CARDIAC CATHETERIZATION  08/2007   nonobstructive dz except 75% obstruction in OM, EF 60%--med mgmt  . CARDIOVASCULAR STRESS TEST  2002; 12/2008; 03/2012   2002: adenosine myoview NORMAL.  2010-Dr. Crenshaw: NORMAL adenosine myoview, no sign of scar or ischemia, EF normal.  2013-Dr. Nishan: NORMAL Lexiscan stress test, normal LV fxn/EF, normal wall motion  . CHOLECYSTECTOMY    . COLONOSCOPY W/ BIOPSIES  05/2009   Dr. Frederick Peers (bx showed benign colonic mucosa)  . COLONOSCOPY W/ BIOPSIES  09/2002   Dr. Frederick Peers (  . COSMETIC EAR SURGERY    .  ESOPHAGOGASTRODUODENOSCOPY  05/2011   Dr. Vashti Hey at [facility]--s/p dilatation, o/w normal exam.  . EYE SURGERY    . RECTOCELE REPAIR    . RIGHT FOOT SURGERY    . TUBAL LIGATION    . VAGIOCELE       Medications: Current Outpatient Prescriptions  Medication Sig Dispense Refill  . albuterol (PROVENTIL) (2.5 MG/3ML) 0.083% nebulizer solution Take 3 mLs (2.5 mg total) by nebulization every 6 (six) hours as needed for wheezing. 75 mL 6  . albuterol-ipratropium (COMBIVENT) 18-103 MCG/ACT inhaler Inhale 1-2 puffs into the lungs  every 6 (six) hours as needed for wheezing or shortness of breath.     Marland Kitchen amLODipine (NORVASC) 10 MG tablet Take 10 mg by mouth daily.     Marland Kitchen aspirin 81 MG EC tablet Take 81 mg by mouth daily.     . benazepril (LOTENSIN) 20 MG tablet TAKE ONE TABLET BY MOUTH DAILY FOR HIGH BLOOD PRESSURE.    . benztropine (COGENTIN) 0.5 MG tablet Take 1 tablet (0.5 mg total) by mouth 2 (two) times daily.    . carvedilol (COREG) 12.5 MG tablet Take 12.5 mg by mouth 2 (two) times daily with a meal.     . clonazePAM (KLONOPIN) 1 MG tablet Take 1 mg by mouth 3 (three) times daily.    . diclofenac sodium (VOLTAREN) 1 % GEL Apply 2g to affected joint up to 4 times daily as needed (Patient taking differently: Apply 2 g topically 4 (four) times daily as needed. Apply 2g to affected joint up to 4 times daily as needed) 100 g 3  . DULoxetine (CYMBALTA) 60 MG capsule TAKE ONE CAPSULE BY MOUTH DAILY FOR ANXIETY AND DEPRESSION. 90 capsule 0  . estradiol (ESTRACE) 0.1 MG/GM vaginal cream Insert one applicatorful vaginally once daily    . fexofenadine (ALLEGRA) 60 MG tablet take one tablet by mouth twice daily as needed for nasal allergies    . fish oil-omega-3 fatty acids 1000 MG capsule Take 2 capsules (2 g total) by mouth daily. For lipid control.    . fluticasone (CUTIVATE) 0.05 % cream Apply 1 application topically 2 (two) times daily.     Marland Kitchen glimepiride (AMARYL) 2 MG tablet Take 2 mg by mouth daily with breakfast.     . lamoTRIgine (LAMICTAL) 25 MG tablet TAKE 1 TABLET BY MOUTH TWICE A DAY. 60 tablet 3  . lidocaine (LIDODERM) 5 % Place 1 patch onto the skin daily.     . Melatonin 3 MG TABS Take 6 mg by mouth at bedtime.     . metoprolol succinate (TOPROL-XL) 50 MG 24 hr tablet TAKE ONE TABLET BY MOUTH EVERY DAY WITH OR IMMEDIATELY FOLLOWING A MEAL. 30 tablet 3  . nitroGLYCERIN (NITROSTAT) 0.4 MG SL tablet Place 1 tablet (0.4 mg total) under the tongue every 5 (five) minutes as needed for chest pain. 90 tablet 3  . OLANZapine  zydis (ZYPREXA) 5 MG disintegrating tablet Take 1 tablet (5 mg total) by mouth at bedtime.    Marland Kitchen omeprazole (PRILOSEC) 20 MG capsule TAKE (1) CAPSULE BY MOUTH TWICE DAILY. 60 capsule 3  . oxyCODONE-acetaminophen (PERCOCET) 10-325 MG tablet Take 2 tablets by mouth every 8 (eight) hours as needed for pain. Then take 1 tablet extra daily for break through pain.    . pravastatin (PRAVACHOL) 80 MG tablet Take 1 tablet (80 mg total) by mouth daily. 90 tablet 1  . traZODone (DESYREL) 50 MG tablet Take 50 mg by mouth at bedtime.     Marland Kitchen  Vitamin D, Ergocalciferol, (DRISDOL) 50000 UNITS CAPS capsule Take 50,000 Units by mouth every 7 (seven) days.      No current facility-administered medications for this visit.     Allergies: Allergies  Allergen Reactions  . Dust Mite Extract   . Januvia [Sitagliptin Phosphate] Nausea Only  . Latex   . Metformin Nausea And Vomiting    dizziness  . Metoclopramide Hcl     Unknown   . Penicillins     Has patient had a PCN reaction causing immediate rash, facial/tongue/throat swelling, SOB or lightheadedness with hypotension: Yes Has patient had a PCN reaction causing severe rash involving mucus membranes or skin necrosis: No Has patient had a PCN reaction that required hospitalization No Has patient had a PCN reaction occurring within the last 10 years: Yes If all of the above answers are "NO", then may proceed with Cephalosporin use.   . Sulfonamide Derivatives     REACTION: Reaction not known    Social History: The patient  reports that she has been smoking Cigarettes.  She has been smoking about 1.50 packs per day. She has never used smokeless tobacco. She reports that she does not drink alcohol or use drugs.   Family History: The patient's family history includes Asthma in her brother and sister; Cancer in her mother and sister; Diabetes in her brother; Heart attack in her brother and sister; Heart disease in her brother.   Review of Systems: Please see  the history of present illness.   Otherwise, the review of systems is positive for none.   All other systems are reviewed and negative.   Physical Exam: VS:  There were no vitals taken for this visit. Marland Kitchen  BMI There is no height or weight on file to calculate BMI.  Wt Readings from Last 3 Encounters:  07/11/15 153 lb (69.4 kg)  07/04/15 160 lb (72.6 kg)  06/16/15 150 lb (68 kg)    General: Pleasant. Well developed, well nourished and in no acute distress.   HEENT: Normal.  Neck: Supple, no JVD, carotid bruits, or masses noted.  Cardiac: Regular rate and rhythm. No murmurs, rubs, or gallops. No edema.  Respiratory:  Lungs are clear to auscultation bilaterally with normal work of breathing.  GI: Soft and nontender.  MS: No deformity or atrophy. Gait and ROM intact.  Skin: Warm and dry. Color is normal.  Neuro:  Strength and sensation are intact and no gross focal deficits noted.  Psych: Alert, appropriate and with normal affect.   LABORATORY DATA:  EKG:  EKG is ordered today. This demonstrates .  Lab Results  Component Value Date   WBC 10.6 (H) 07/11/2015   HGB 14.1 07/11/2015   HCT 42.6 07/11/2015   PLT 371 07/11/2015   GLUCOSE 106 (H) 07/11/2015   CHOL 137 07/19/2014   TRIG 144 07/19/2014   HDL 37 (L) 07/19/2014   LDLDIRECT 133.1 10/31/2006   LDLCALC 71 07/19/2014   ALT 10 (L) 06/17/2015   AST 18 06/17/2015   NA 139 07/11/2015   K 3.9 07/11/2015   CL 102 07/11/2015   CREATININE 0.68 07/11/2015   BUN 7 07/11/2015   CO2 29 07/11/2015   TSH 0.986 07/19/2014   INR 1.1 (H) 05/08/2011   HGBA1C 5.4 07/19/2014   MICROALBUR 3.6 (H) 03/24/2014    BNP (last 3 results) No results for input(s): BNP in the last 8760 hours.  ProBNP (last 3 results) No results for input(s): PROBNP in the last 8760 hours.  Other Studies Reviewed Today:  Assessment/Plan:  1.  Chest pain Atypical.  It has been almost 3 years since her last assessment for ischemia.  It is reasonable to  proceed with a followup stress test.  Arrange a Lexiscan Myoview. She also continues to smoke.  She has a cough.  I will also check a chest x-ray.  She is actually requesting antibiotics today.  If she has an infiltrate on her chest x-ray, I will place her on antibiotics.  2.  Dyspnea Obtain Myoview and chest x-ray as noted.  3.  Fatigue She describes fatigue and decreased exercise tolerance with her symptoms. Obtain Myoview and chest x-ray as noted. I will also obtain labs: Basic metabolic panel, CBC and TSH.  4.  Chronic pain She is requesting a letter to go to her insurance company to request 24-hour care while her husband is away being evaluated in [facility]. I explained that it is more appropriate for this to come from her primary care physician.  5.  Onychomycosis She is requesting treatment for me.  I explained to her that this really should be prescribed by her primary care provider.  6.  Hypertension Controlled.  7.  Hyperlipidemia Managed by her PCP.  8.  COPD She knows that she needs to quit smoking.  Current medicines are reviewed with the patient today.  The patient does not have concerns regarding medicines other than what has been noted above.  The following changes have been made:  See above.  Labs/ tests ordered today include:   No orders of the defined types were placed in this encounter.    Disposition:   FU with *** in {gen number [id number] {Days to years:10300}.   Patient is agreeable to this plan and will call if any problems develop in the interim.   Signed: Burtis Junes, RN, ANP-C 07/30/2016 3:41 PM  [facility] [address] [place], [place]  [address] Phone: [phone] Fax: [phone]

## 2016-07-30 NOTE — Telephone Encounter (Signed)
Very sorry, am unable to accomodate at this time

## 2016-07-31 NOTE — Telephone Encounter (Signed)
Called patient and left VM letting patient know DR Jenny Reichmann is unable to except her as a new patient but to call back if she would like to see someone else thanks.

## 2016-08-15 ENCOUNTER — Encounter: Payer: Self-pay | Admitting: Nurse Practitioner

## 2016-08-29 IMAGING — DX DG KNEE COMPLETE 4+V*R*
4 series · 4 of 4 positions shown · non-contrast
Comparison: None.

CLINICAL DATA: Fall onto right knee with right knee pain.

EXAM:
RIGHT KNEE - COMPLETE 4+ VIEW

[knee ap (1 of 3)]
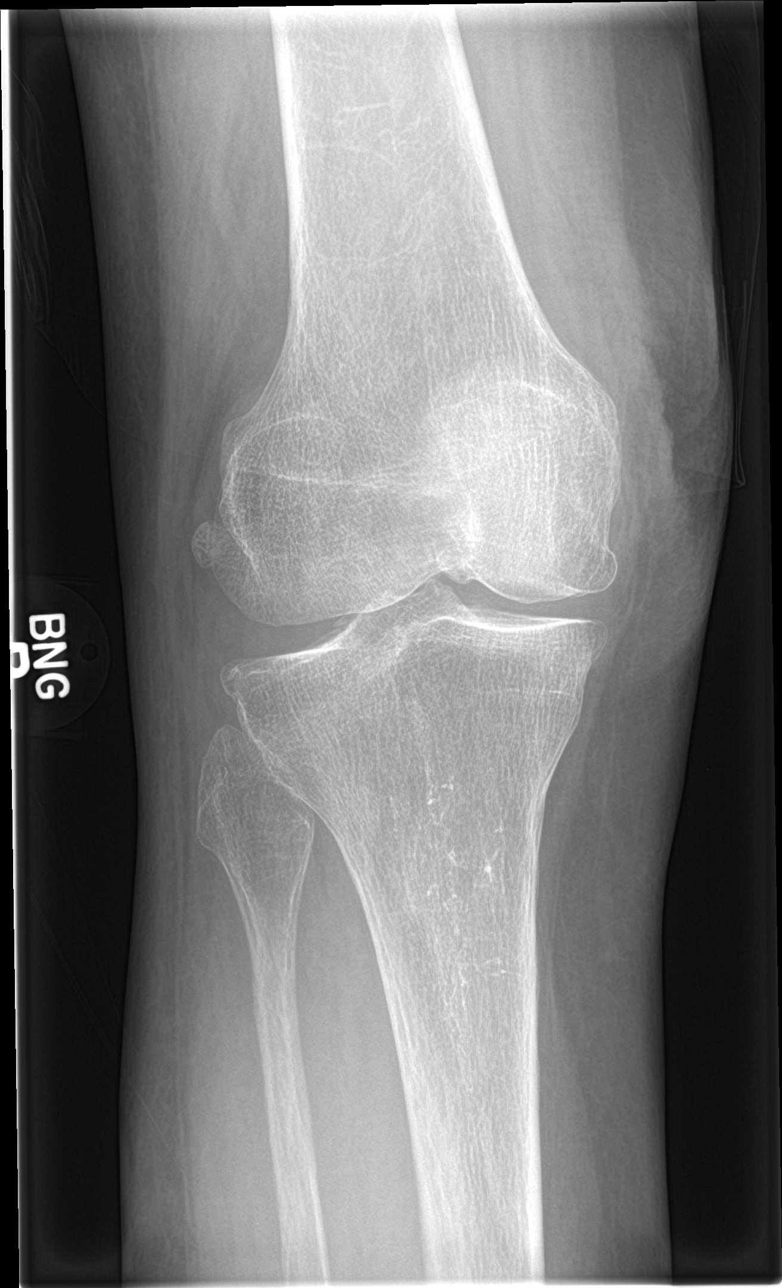

[knee lat]
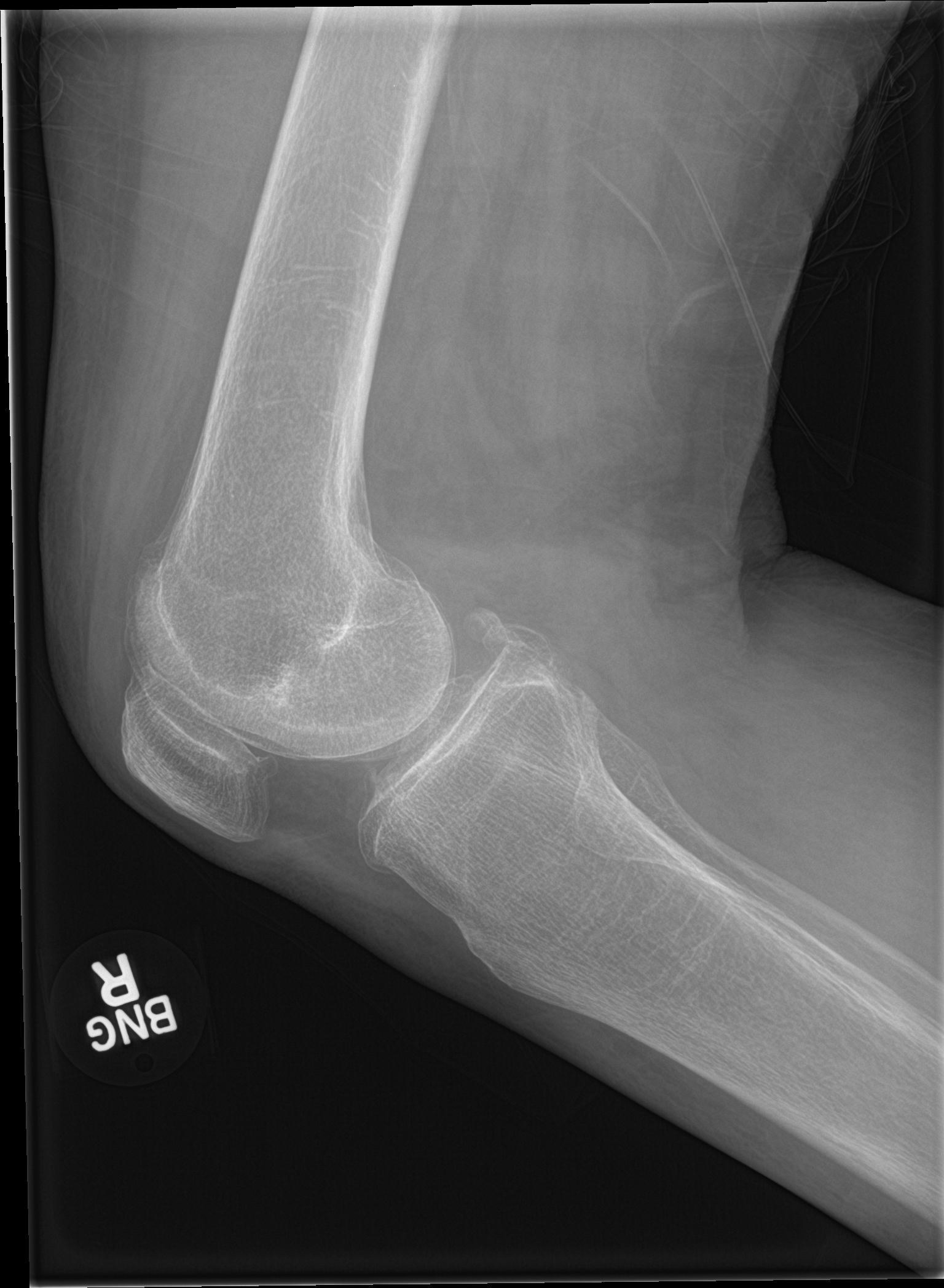

[knee ap (2 of 3)]
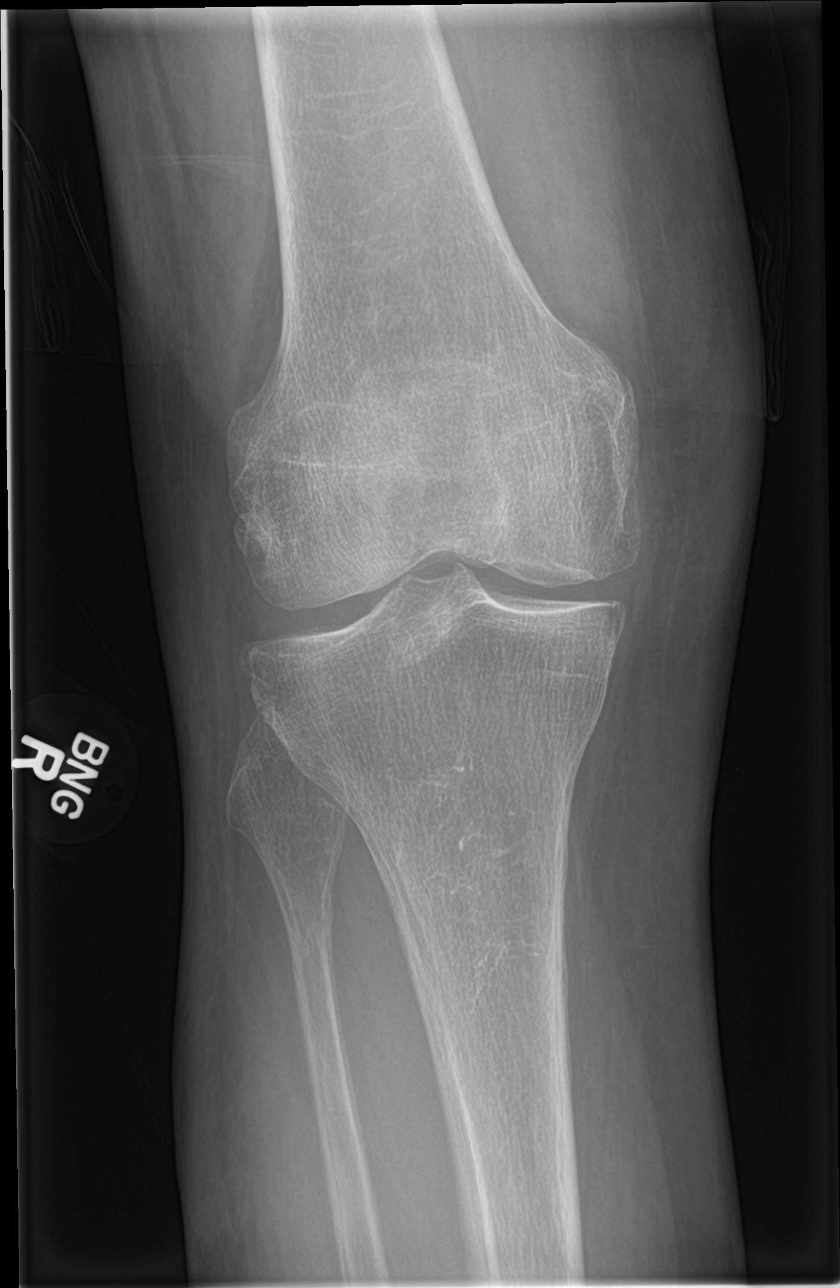

[knee ap (3 of 3)]
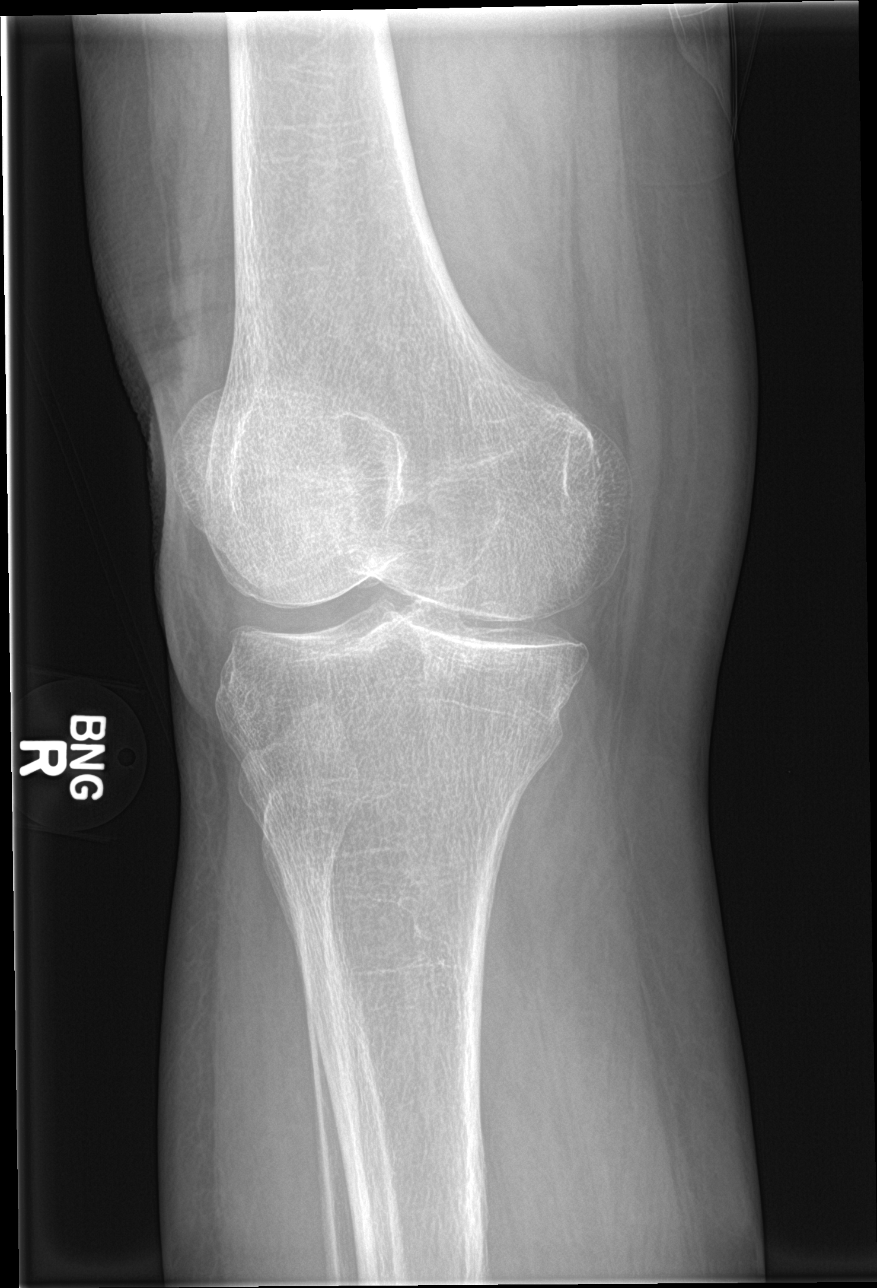

[4 of 4 positions shown; findings below may reference images not displayed]

FINDINGS: No acute fracture or dislocation. Mild tricompartmental osteophytes
with preservation of joint spaces. Questionable intra-articular body
posteriorly versus osteophyte. A fabella is present. The bones
appear under mineralized. No joint effusion.
IMPRESSION: Tricompartmental osteoarthritis without acute fracture or
dislocation.

## 2016-08-29 IMAGING — DX DG CHEST 2V
2 series · 2 of 2 positions shown · non-contrast
Comparison: 07/08/2014

CLINICAL DATA: Congestion, chest pain and shortness of breath.

EXAM:
CHEST - 2 VIEW

[chest pa]
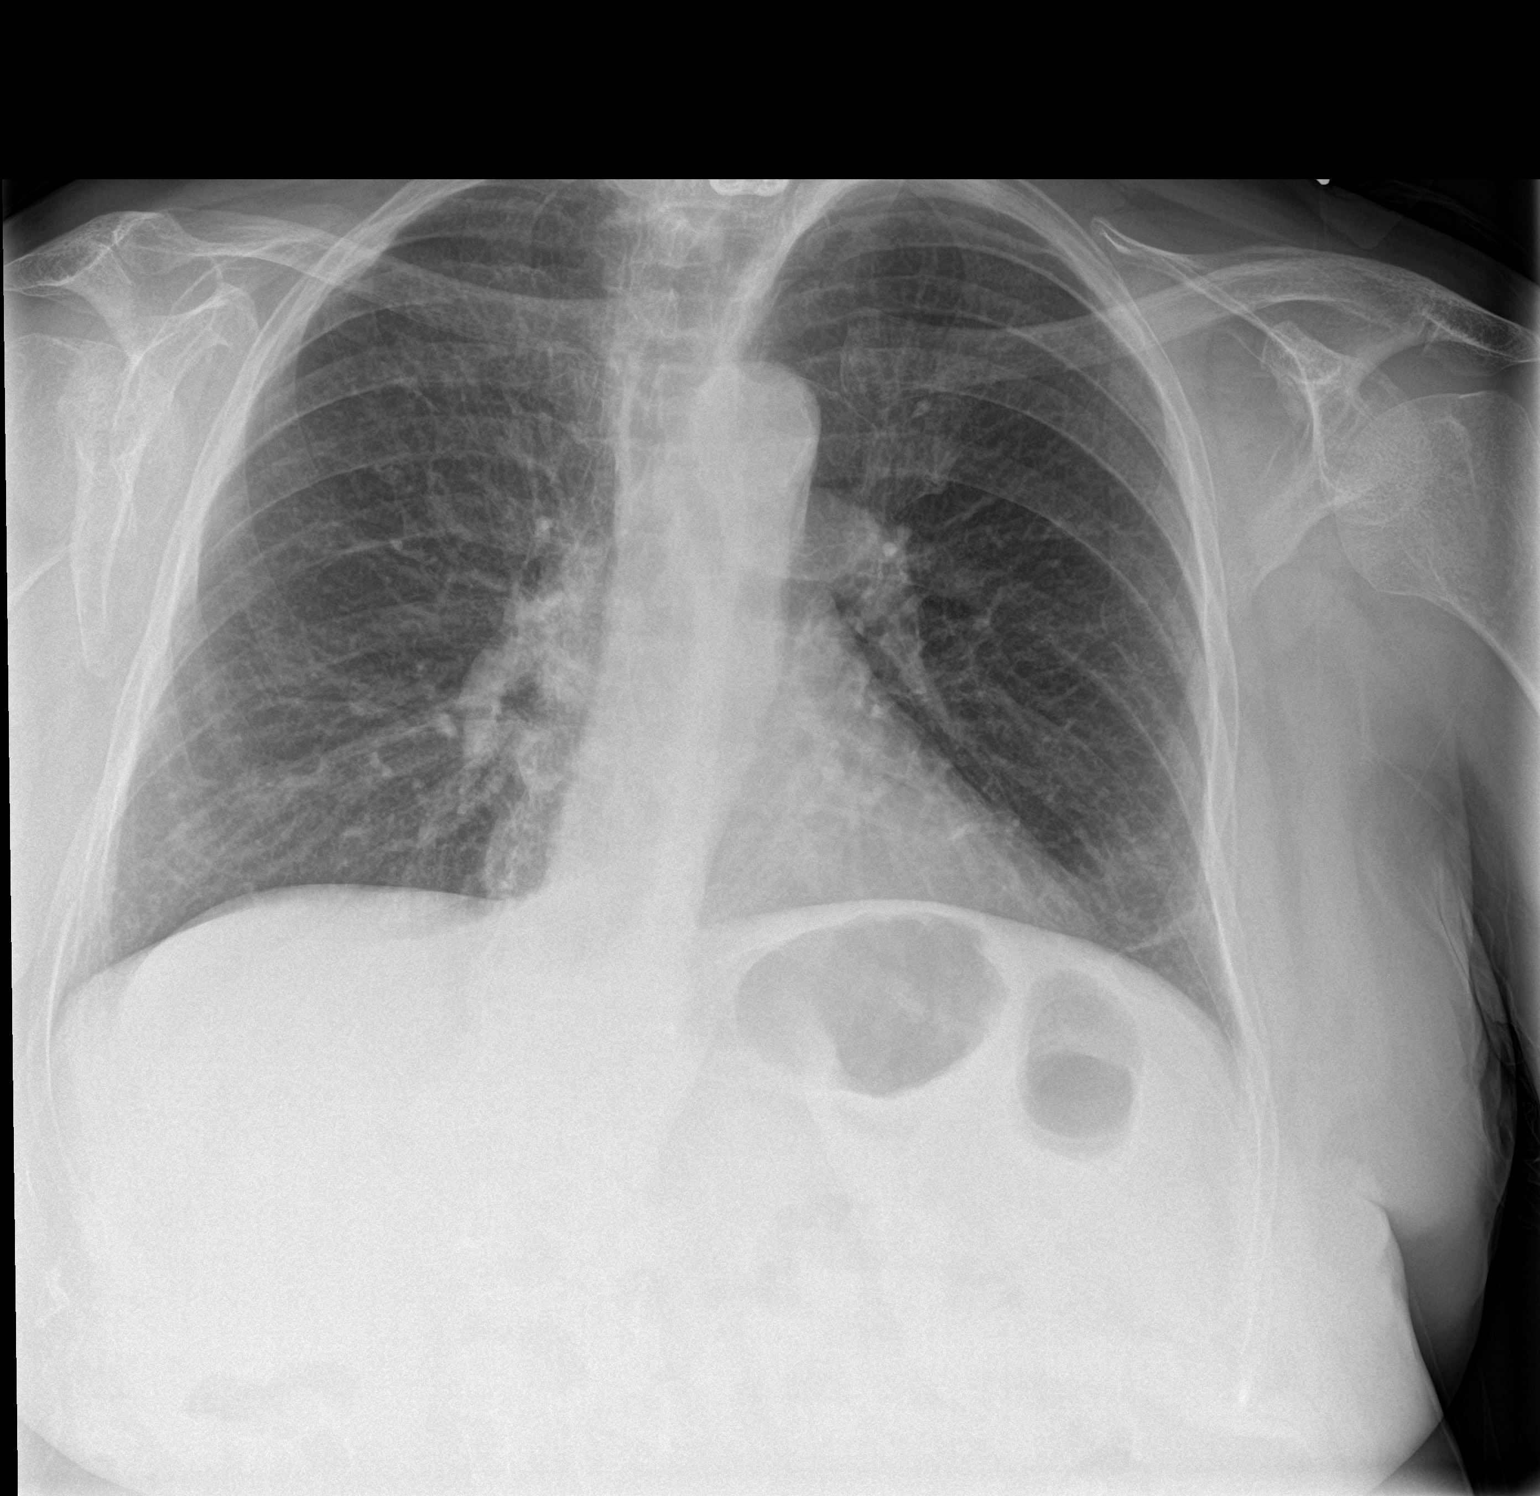

[chest lat]
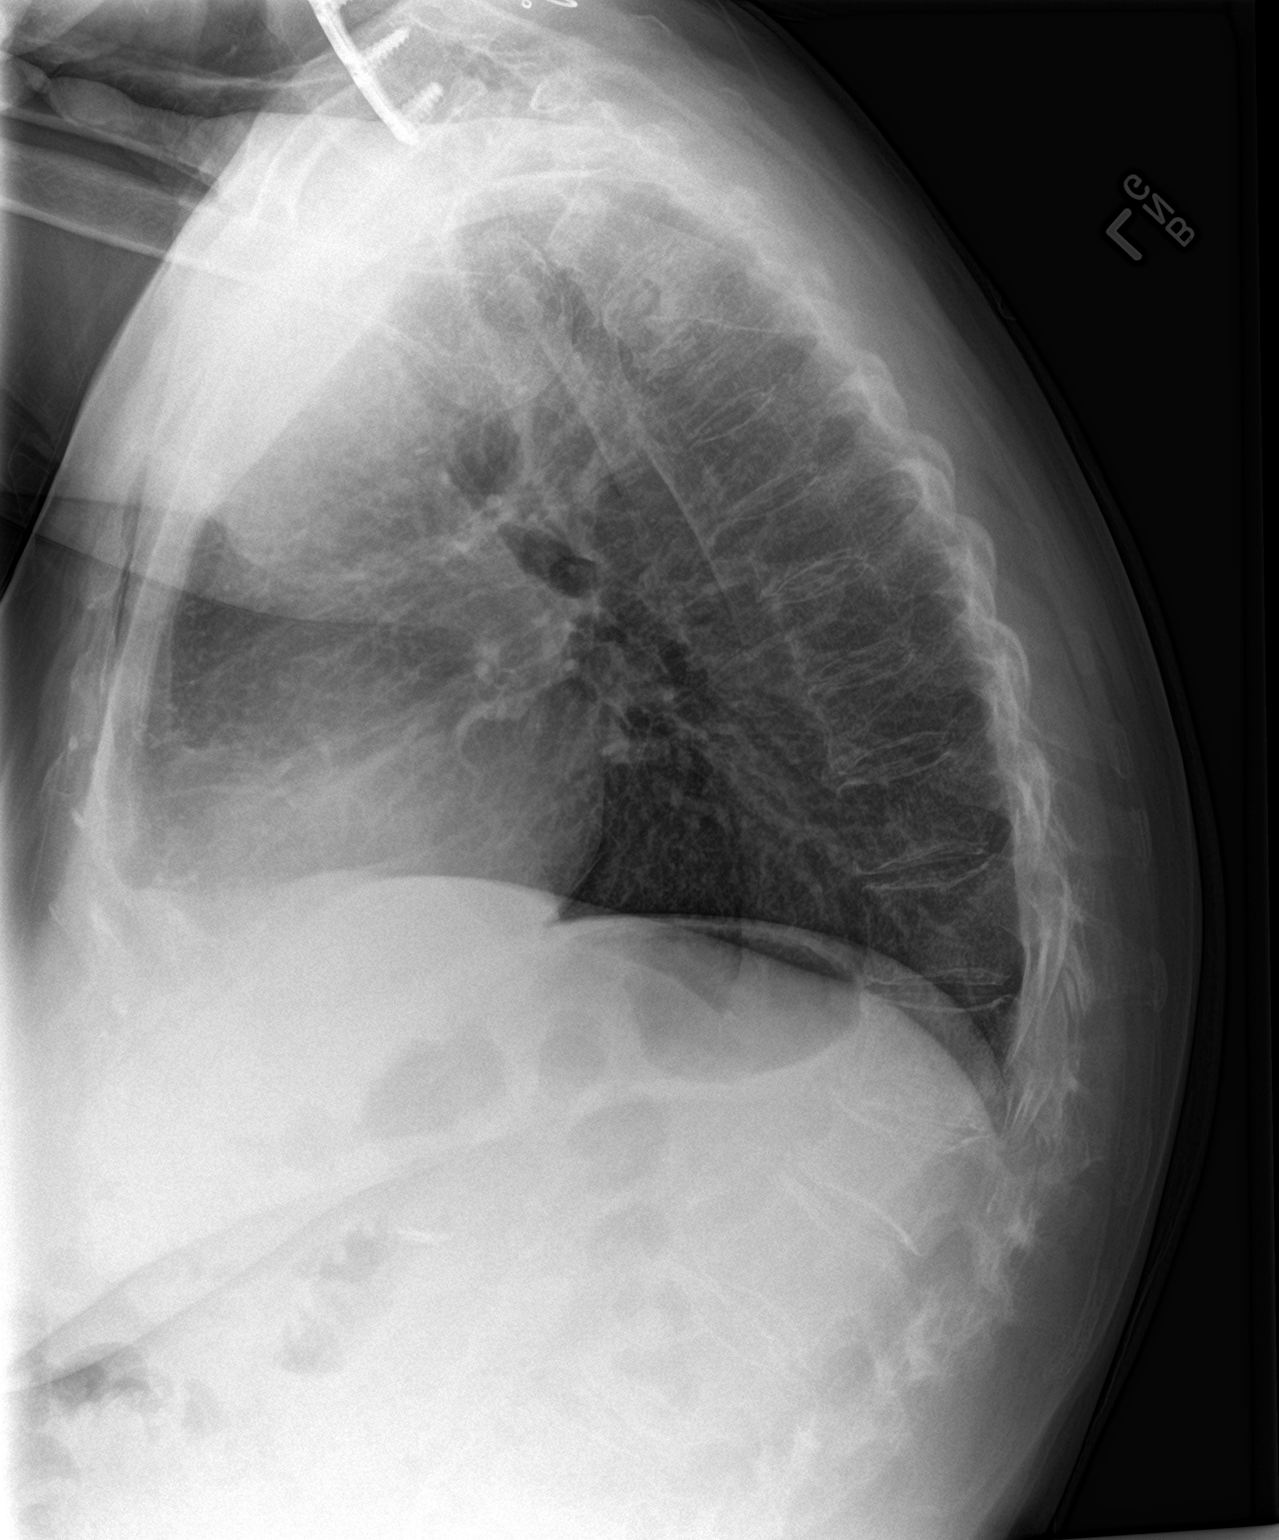

[2 of 2 positions shown; findings below may reference images not displayed]

FINDINGS: The heart size and mediastinal contours are within normal limits.
Stable focal area of scarring at the left lung base. There is no
evidence of pulmonary edema, consolidation, pneumothorax, nodule or
pleural fluid. Stable osteopenia and mild loss of height of a lower
thoracic vertebral body.
IMPRESSION: No active disease.

## 2016-09-01 ENCOUNTER — Emergency Department (HOSPITAL_COMMUNITY): Payer: Medicare Other

## 2016-09-01 ENCOUNTER — Emergency Department (HOSPITAL_COMMUNITY)
Admission: EM | Admit: 2016-09-01 | Discharge: 2016-09-01 | Disposition: A | Discharge status: Home / Self Care | Payer: Medicare Other | Attending: Emergency Medicine | Admitting: Emergency Medicine

## 2016-09-01 ENCOUNTER — Encounter (HOSPITAL_COMMUNITY): Payer: Self-pay | Admitting: *Deleted

## 2016-09-01 DIAGNOSIS — R05 Cough: Secondary | ICD-10-CM | POA: Diagnosis present

## 2016-09-01 DIAGNOSIS — J45909 Unspecified asthma, uncomplicated: Secondary | ICD-10-CM | POA: Insufficient documentation

## 2016-09-01 DIAGNOSIS — I10 Essential (primary) hypertension: Secondary | ICD-10-CM | POA: Diagnosis not present

## 2016-09-01 DIAGNOSIS — R110 Nausea: Secondary | ICD-10-CM | POA: Diagnosis not present

## 2016-09-01 DIAGNOSIS — R531 Weakness: Secondary | ICD-10-CM

## 2016-09-01 DIAGNOSIS — R509 Fever, unspecified: Secondary | ICD-10-CM | POA: Diagnosis not present

## 2016-09-01 DIAGNOSIS — J441 Chronic obstructive pulmonary disease with (acute) exacerbation: Secondary | ICD-10-CM | POA: Diagnosis not present

## 2016-09-01 DIAGNOSIS — I2510 Atherosclerotic heart disease of native coronary artery without angina pectoris: Secondary | ICD-10-CM | POA: Diagnosis not present

## 2016-09-01 DIAGNOSIS — R5383 Other fatigue: Secondary | ICD-10-CM | POA: Insufficient documentation

## 2016-09-01 DIAGNOSIS — F17210 Nicotine dependence, cigarettes, uncomplicated: Secondary | ICD-10-CM | POA: Diagnosis not present

## 2016-09-01 DIAGNOSIS — R059 Cough, unspecified: Secondary | ICD-10-CM

## 2016-09-01 DIAGNOSIS — Z7982 Long term (current) use of aspirin: Secondary | ICD-10-CM | POA: Insufficient documentation

## 2016-09-01 DIAGNOSIS — Z79899 Other long term (current) drug therapy: Secondary | ICD-10-CM | POA: Diagnosis not present

## 2016-09-01 DIAGNOSIS — E119 Type 2 diabetes mellitus without complications: Secondary | ICD-10-CM | POA: Diagnosis not present

## 2016-09-01 LAB — COMPREHENSIVE METABOLIC PANEL
ALT: 16 U/L (ref 14–54)
ANION GAP: 10 (ref 5–15)
AST: 18 U/L (ref 15–41)
Albumin: 3.9 g/dL (ref 3.5–5.0)
Alkaline Phosphatase: 58 U/L (ref 38–126)
BUN: 11 mg/dL (ref 6–20)
CHLORIDE: 97 mmol/L — AB (ref 101–111)
CO2: 27 mmol/L (ref 22–32)
Calcium: 9.4 mg/dL (ref 8.9–10.3)
Creatinine, Ser: 0.7 mg/dL (ref 0.44–1.00)
GFR calc non Af Amer: >60 mL/min (ref >60)
Glucose, Bld: 177 mg/dL — ABNORMAL HIGH (ref 65–99)
Potassium: 3.3 mmol/L — ABNORMAL LOW (ref 3.5–5.1)
SODIUM: 134 mmol/L — AB (ref 135–145)
Total Bilirubin: 0.6 mg/dL (ref 0.3–1.2)
Total Protein: 7.8 g/dL (ref 6.5–8.1)

## 2016-09-01 LAB — CBC WITH DIFFERENTIAL/PLATELET
Basophils Absolute: 0.1 10*3/uL (ref 0.0–0.1)
Basophils Relative: 0 %
EOS ABS: 0.1 10*3/uL (ref 0.0–0.7)
EOS PCT: 1 %
HCT: 44.9 % (ref 36.0–46.0)
Hemoglobin: 15.1 g/dL — ABNORMAL HIGH (ref 12.0–15.0)
LYMPHS ABS: 3.4 10*3/uL (ref 0.7–4.0)
Lymphocytes Relative: 22 %
MCH: 29.8 pg (ref 26.0–34.0)
MCHC: 33.6 g/dL (ref 30.0–36.0)
MCV: 88.6 fL (ref 78.0–100.0)
Monocytes Absolute: 0.8 10*3/uL (ref 0.1–1.0)
Monocytes Relative: 5 %
Neutro Abs: 11.1 10*3/uL — ABNORMAL HIGH (ref 1.7–7.7)
Neutrophils Relative %: 72 %
PLATELETS: 380 10*3/uL (ref 150–400)
RBC: 5.07 MIL/uL (ref 3.87–5.11)
RDW: 14.2 % (ref 11.5–15.5)
WBC: 15.5 10*3/uL — AB (ref 4.0–10.5)

## 2016-09-01 LAB — LIPASE, BLOOD: Lipase: 22 U/L (ref 11–51)

## 2016-09-01 MED ORDER — SODIUM CHLORIDE 0.9 % IV SOLN
1000.0000 mL | INTRAVENOUS | Status: DC
  Administered 2016-09-01: 1000 mL via INTRAVENOUS

## 2016-09-01 MED ORDER — ONDANSETRON HCL 4 MG/2ML IJ SOLN
4.0000 mg | Freq: Once | INTRAMUSCULAR | Status: AC
  Administered 2016-09-01: 4 mg via INTRAVENOUS
  Filled 2016-09-01: qty 2

## 2016-09-01 MED ORDER — ALBUTEROL SULFATE HFA 108 (90 BASE) MCG/ACT IN AERS: 2.0000 | INHALATION_SPRAY | RESPIRATORY_TRACT | 0 refills | Status: AC | PRN

## 2016-09-01 MED ORDER — SODIUM CHLORIDE 0.9 % IV SOLN
1000.0000 mL | Freq: Once | INTRAVENOUS | Status: AC
  Administered 2016-09-01: 1000 mL via INTRAVENOUS

## 2016-09-01 NOTE — ED Triage Notes (Signed)
Pt comes in with cough and congestion for 3 days. Pt had shortness of breath upon walking to her room. No shortness of breath at rest.

## 2016-09-01 NOTE — ED Notes (Signed)
ED Provider at bedside. 

## 2016-09-01 NOTE — ED Provider Notes (Signed)
[facility] DEPT Provider Note   CSN: [id number] Arrival date & time: 09/01/16  [id number]  By signing my name below, I, Sonum Patel, attest that this documentation has been prepared under the direction and in the presence of Jola Schmidt, MD. Electronically Signed: Sonum Patel, Education administrator. 09/01/16. 10:54 AM.  History   Chief Complaint Chief Complaint  Patient presents with  . Cough    The history is provided by the patient. No language interpreter was used.    HPI Comments: [name] is a 70 y.o. female who presents to the Emergency Department complaining of unchanged generalized weakness with associated fatigue and stumbling that has been ongoing for the past 1 month. She also reports cough that is productive of sputum, subjective fever, and chills that has been ongoing for the past 3-4 months. She reports recent nausea for which she takes promethazine. She denies vomiting, current SOB, recent weight loss.   Past Medical History:  Diagnosis Date  . Anxiety   . Asthma   . Bipolar disorder ([place])    Psych hosp admission 08/2012--manic episode.  Involuntary committment 10/12/14-manic/psychotic.  Marland Kitchen CAD (coronary artery disease)    Moderate disease of the left circumflex managed medically  . Chronic cystitis   . Chronic pain   . COPD (chronic obstructive pulmonary disease) ([place])   . DDD (degenerative disc disease), cervical   . DDD (degenerative disc disease), lumbar    MRI 12/2013: right foraminal disc protrusion L4-5, mild facet hypertrophy L5-S1--gets ESI's by Dr. Vira Blanco  . Diverticul disease small and large intestine, no perforati or abscess   . Fibromyalgia   . Gastroparesis    abnl gastric emptying scan 2010 (Dr. Deatra Ina)  . GERD (gastroesophageal reflux disease)   . Hyperlipidemia   . Hypertension   . Leukocytosis    chronic, mild, s/p eval 03/2013 by hematologist --reassured; likely secondary to ongoing smoking  . Muscle spasm   . Neurotic excoriations 01/28/2013  .  Obesity   . Rectal prolapse   . Right knee pain Summer 2014   MRI 04/2013: large popliteal fossa ganglion cyst, mod medial compartment and patellofemoral osteoarth--referred to [facility] at that time (Dr. Berenice Primas)  . Seizure ([place])   . Skin cancer   . Tobacco user   . Type II or unspecified type diabetes mellitus with neurological manifestations, not stated as uncontrolled(250.60) 08/17/2008   Hx of diabetic foot ulcer.  . Urge and stress incontinence     Patient Active Problem List   Diagnosis Date Noted  . Bipolar 1 disorder, mixed, moderate ([place]) 06/17/2015  . Acute encephalopathy 06/16/2015  . Hallucination 06/16/2015  . Bipolar I disorder, most recent episode depressed ([place]) 07/18/2014  . Psychosis 07/17/2014  . Hypokalemia   . Fibromyalgia 07/09/2014  . Restless leg syndrome 07/09/2014  . Chest pain 07/09/2014  . Altered mental status 07/08/2014  . Personal history of diabetic foot ulcer 04/15/2014  . Palpitations 03/24/2014  . Chronic nausea 03/24/2014  . Dysuria 03/24/2014  . Chronic pain syndrome 10/21/2013  . HTN (hypertension), benign 10/21/2013  . Chronic instability of right knee 08/02/2013  . COPD with acute exacerbation ([place]) 07/26/2013  . Chronic pain of right knee 07/12/2013  . Acute pain of right knee 01/28/2013  . Lymphocytosis 01/28/2013  . Contusion, hip 01/28/2013  . Atrophic vaginitis 01/28/2013  . Neurotic excoriations 01/28/2013  . COPD (chronic obstructive pulmonary disease) ([place]) 03/03/2012  . Elevated fasting blood sugar 11/24/2011  . Chronic pain 09/13/2011  . Cervical osteoarthritis 09/13/2011  .  H/O surgical procedure 09/13/2011  . H/O: hysterectomy 09/13/2011  . History of cholecystectomy 09/13/2011  . BP (high blood pressure) 09/13/2011  . HLD (hyperlipidemia) 09/13/2011  . LBP (low back pain) 09/13/2011  . Post-operative state 09/13/2011  . Constipation 05/08/2011  . NECK PAIN, CHRONIC 08/30/2010  . FULL INCONTINENCE OF FECES  04/03/2010  . Anxiety state 11/30/2009  . GASTROPARESIS 11/30/2009  . CYSTITIS, CHRONIC 11/30/2009  . OVERACTIVE BLADDER 11/30/2009  . BREAST MASS, LEFT 11/30/2009  . URINARY INCONTINENCE, STRESS, FEMALE 11/30/2009  . URGE INCONTINENCE 11/30/2009  . ABDOMINAL PAIN-MULTIPLE SITES 03/17/2009  . SKIN RASH, ALLERGIC 10/27/2008  . Type II or unspecified type diabetes mellitus with neurological manifestations, not stated as uncontrolled(250.60) 08/17/2008  . MORBID OBESITY 07/07/2008  . ASTHMA UNSPECIFIED WITH EXACERBATION 06/22/2008  . CERVICAL RADICULOPATHY 04/18/2008  . LEG PAIN, BILATERAL 04/18/2008  . DIZZINESS 04/18/2008  . RASH-NONVESICULAR 03/02/2008  . Other and unspecified hyperlipidemia 11/12/2007  . OBESITY 11/12/2007  . SINUSITIS, CHRONIC 11/12/2007  . ESOPHAGITIS 11/12/2007  . RECTOCELE WITHOUT MENTION OF UTERINE PROLAPSE 11/12/2007  . ARTHRITIS 11/12/2007  . Diarrhea 11/12/2007  . CAD 09/01/2007  . LOW BACK PAIN, CHRONIC 08/13/2007  . HYPONATREMIA 07/06/2007  . IRRITABLE BOWEL SYNDROME 07/06/2007  . FIBROMYALGIA 07/06/2007  . TOBACCO ABUSE 04/03/2007  . DEPRESSION 04/03/2007  . Essential hypertension 04/03/2007  . ASTHMA 04/03/2007  . GERD 04/03/2007  . SEIZURE DISORDER 04/03/2007    Past Surgical History:  Procedure Laterality Date  . ABDOMINAL HYSTERECTOMY    . ANTERIOR CERVICAL DECOMP/DISCECTOMY FUSION     C5-C7 levels  . APPENDECTOMY    . BLADDER SURGERY    . CARDIAC CATHETERIZATION  08/2007   nonobstructive dz except 75% obstruction in OM, EF 60%--med mgmt  . CARDIOVASCULAR STRESS TEST  2002; 12/2008; 03/2012   2002: adenosine myoview NORMAL.  2010-Dr. Crenshaw: NORMAL adenosine myoview, no sign of scar or ischemia, EF normal.  2013-Dr. Nishan: NORMAL Lexiscan stress test, normal LV fxn/EF, normal wall motion  . CHOLECYSTECTOMY    . CHOLECYSTECTOMY    . COLONOSCOPY W/ BIOPSIES  05/2009   Dr. Frederick Peers (bx showed benign colonic mucosa)  .  COLONOSCOPY W/ BIOPSIES  09/2002   Dr. Frederick Peers (  . COSMETIC EAR SURGERY    . ESOPHAGOGASTRODUODENOSCOPY  05/2011   Dr. Vashti Hey at [place] [facility]--s/p dilatation, o/w normal exam.  . EYE SURGERY    . RECTOCELE REPAIR    . RIGHT FOOT SURGERY    . TUBAL LIGATION    . VAGIOCELE      OB History    No data available       Home Medications    Prior to Admission medications   Medication Sig Start Date End Date Taking? Authorizing Provider  albuterol (PROVENTIL) (2.5 MG/3ML) 0.083% nebulizer solution Take 3 mLs (2.5 mg total) by nebulization every 6 (six) hours as needed for wheezing. 07/25/14  Yes Kerrie Buffalo, NP  albuterol-ipratropium (COMBIVENT) 18-103 MCG/ACT inhaler Inhale 1-2 puffs into the lungs every 6 (six) hours as needed for wheezing or shortness of breath.  06/28/15  Yes Historical Provider, MD  amLODipine (NORVASC) 10 MG tablet Take 10 mg by mouth daily.  06/28/15  Yes Historical Provider, MD  aspirin 81 MG EC tablet Take 81 mg by mouth daily.  06/28/15   Historical Provider, MD  benazepril (LOTENSIN) 20 MG tablet TAKE ONE TABLET BY MOUTH DAILY FOR HIGH BLOOD PRESSURE. 06/28/15   Historical Provider, MD  benztropine (COGENTIN) 0.5 MG tablet Take 1  tablet (0.5 mg total) by mouth 2 (two) times daily. 06/19/15   Samuella Cota, MD  carvedilol (COREG) 12.5 MG tablet Take 12.5 mg by mouth 2 (two) times daily with a meal.  04/10/15   Historical Provider, MD  clonazePAM (KLONOPIN) 1 MG tablet Take 1 mg by mouth 3 (three) times daily. 06/28/15   Historical Provider, MD  diclofenac sodium (VOLTAREN) 1 % GEL Apply 2g to affected joint up to 4 times daily as needed Patient taking differently: Apply 2 g topically 4 (four) times daily as needed. Apply 2g to affected joint up to 4 times daily as needed 08/30/14   Tammi Sou, MD  DULoxetine (CYMBALTA) 60 MG capsule TAKE ONE CAPSULE BY MOUTH DAILY FOR ANXIETY AND DEPRESSION. 01/04/15   Tammi Sou, MD  estradiol  (ESTRACE) 0.1 MG/GM vaginal cream Insert one applicatorful vaginally once daily 06/28/15   Historical Provider, MD  fexofenadine (ALLEGRA) 60 MG tablet take one tablet by mouth twice daily as needed for nasal allergies 06/28/15   Historical Provider, MD  fish oil-omega-3 fatty acids 1000 MG capsule Take 2 capsules (2 g total) by mouth daily. For lipid control. 08/15/12   Ruben Im, PA-C  fluticasone (CUTIVATE) 0.05 % cream Apply 1 application topically 2 (two) times daily.  06/12/15   Historical Provider, MD  glimepiride (AMARYL) 2 MG tablet Take 2 mg by mouth daily with breakfast.  04/10/15   Historical Provider, MD  lamoTRIgine (LAMICTAL) 25 MG tablet TAKE 1 TABLET BY MOUTH TWICE A DAY. 10/18/14   Tammi Sou, MD  lidocaine (LIDODERM) 5 % Place 1 patch onto the skin daily.  06/06/15   Historical Provider, MD  Melatonin 3 MG TABS Take 6 mg by mouth at bedtime.  06/28/15   Historical Provider, MD  metoprolol succinate (TOPROL-XL) 50 MG 24 hr tablet TAKE ONE TABLET BY MOUTH EVERY DAY WITH OR IMMEDIATELY FOLLOWING A MEAL. 09/22/14   Tammi Sou, MD  nitroGLYCERIN (NITROSTAT) 0.4 MG SL tablet Place 1 tablet (0.4 mg total) under the tongue every 5 (five) minutes as needed for chest pain. 07/25/14   Kerrie Buffalo, NP  OLANZapine zydis (ZYPREXA) 5 MG disintegrating tablet Take 1 tablet (5 mg total) by mouth at bedtime. 06/19/15   Samuella Cota, MD  omeprazole (PRILOSEC) 20 MG capsule TAKE (1) CAPSULE BY MOUTH TWICE DAILY. 09/22/14   Tammi Sou, MD  oxyCODONE-acetaminophen (PERCOCET) 10-325 MG tablet Take 2 tablets by mouth every 8 (eight) hours as needed for pain. Then take 1 tablet extra daily for break through pain. 05/15/15   Historical Provider, MD  pravastatin (PRAVACHOL) 80 MG tablet Take 1 tablet (80 mg total) by mouth daily. 07/25/14   Kerrie Buffalo, NP  traZODone (DESYREL) 50 MG tablet Take 50 mg by mouth at bedtime.  06/28/15 07/28/15  Historical Provider, MD  Vitamin D,  Ergocalciferol, (DRISDOL) 50000 UNITS CAPS capsule Take 50,000 Units by mouth every 7 (seven) days.  06/28/15   Historical Provider, MD    Family History Family History  Problem Relation Age of Onset  . Heart attack Sister   . Cancer Sister   . Asthma Sister   . Cancer Mother   . Diabetes Brother   . Heart attack Brother   . Asthma Brother   . Heart disease Brother     Social History Social History  Substance Use Topics  . Smoking status: Current Every Day Smoker    Packs/day: 1.50    Types:  Cigarettes  . Smokeless tobacco: Never Used     Comment: Using E-cigarette also.  Marland Kitchen Alcohol use No     Allergies   Dust mite extract; Januvia [sitagliptin phosphate]; Latex; Metformin; Metoclopramide hcl; Penicillins; and Sulfonamide derivatives   Review of Systems Review of Systems  Constitutional: Positive for chills, fatigue and fever. Negative for unexpected weight change.  Respiratory: Positive for cough. Negative for shortness of breath.   Gastrointestinal: Positive for nausea. Negative for vomiting.  Neurological: Positive for weakness.  All other systems reviewed and are negative.    Physical Exam Updated Vital Signs BP 152/88 (BP Location: Left Arm)   Pulse 113   Temp 97.5 F (36.4 C) (Oral)   Resp 20   Ht 5\' 4"  (1.626 m)   Wt 172 lb (78 kg)   SpO2 91%   BMI 29.52 kg/m   Physical Exam  Constitutional: She is oriented to person, place, and time. She appears well-developed and well-nourished. No distress.  HENT:  Head: Normocephalic and atraumatic.  Eyes: EOM are normal.  Neck: Normal range of motion.  Cardiovascular: Regular rhythm and normal heart sounds.  Tachycardia present.   Pulmonary/Chest: Effort normal and breath sounds normal.  Abdominal: Soft. She exhibits no distension. There is no tenderness.  Musculoskeletal: Normal range of motion.  Neurological: She is alert and oriented to person, place, and time.  Skin: Skin is warm and dry.  Psychiatric:  Judgment normal. Her mood appears anxious.  Nursing note and vitals reviewed.    ED Treatments / Results  DIAGNOSTIC STUDIES: Oxygen Saturation is 91% on RA, low by my interpretation.    COORDINATION OF CARE: 10:40 AM Discussed treatment plan with pt at bedside and pt agreed to plan.   Labs (all labs ordered are listed, but only abnormal results are displayed) Labs Reviewed  CBC WITH DIFFERENTIAL/PLATELET - Abnormal; Notable for the following:       Result Value   WBC 15.5 (*)    Hemoglobin 15.1 (*)    Neutro Abs 11.1 (*)    All other components within normal limits  COMPREHENSIVE METABOLIC PANEL - Abnormal; Notable for the following:    Sodium 134 (*)    Potassium 3.3 (*)    Chloride 97 (*)    Glucose, Bld 177 (*)    All other components within normal limits  LIPASE, BLOOD  URINALYSIS, ROUTINE W REFLEX MICROSCOPIC    EKG  EKG Interpretation None       Radiology Dg Chest 2 View  Result Date: 09/01/2016 CLINICAL DATA:  Cough.  Chills. EXAM: CHEST  2 VIEW COMPARISON:  07/17/2015 and 07/11/2015 FINDINGS: The heart size and mediastinal contours are within normal limits. Both lungs are clear. Old compression deformities of T3 and L1. IMPRESSION: No active cardiopulmonary disease. Electronically Signed   By: Lorriane Shire M.D.   On: 09/01/2016 11:30    Procedures Procedures (including critical care time)  Medications Ordered in ED Medications  0.9 %  sodium chloride infusion (1,000 mLs Intravenous New Bag/Given 09/01/16 1058)    Followed by  0.9 %  sodium chloride infusion (1,000 mLs Intravenous New Bag/Given 09/01/16 1050)  ondansetron (ZOFRAN) injection 4 mg (4 mg Intravenous Given 09/01/16 1058)     Initial Impression / Assessment and Plan / ED Course  I have reviewed the triage vital signs and the nursing notes.  Pertinent labs & imaging results that were available during my care of the patient were reviewed by me and considered in my medical  decision making  (see chart for details).  Clinical Course     Patient is overall well-appearing.  She has a multitude of complaints.  Her vital signs are without significant abnormality.  She's hydrated in the emergency department.  Labs and chest x-ray without significant abnormality.  Primary care follow-up.  Patient understands to return to the ER for new or worsening symptoms  Final Clinical Impressions(s) / ED Diagnoses   Final diagnoses:  Cough  Weakness    New Prescriptions New Prescriptions   No medications on file   I personally performed the services described in this documentation, which was scribed in my presence. The recorded information has been reviewed and is accurate.        Jola Schmidt, MD 09/01/16 1230

## 2016-09-01 NOTE — ED Notes (Signed)
Pt was given a gingerale.

## 2016-10-14 ENCOUNTER — Other Ambulatory Visit: Payer: Self-pay | Admitting: Family Medicine

## 2017-04-01 ENCOUNTER — Emergency Department (HOSPITAL_COMMUNITY): Payer: Medicare Other

## 2017-04-01 ENCOUNTER — Encounter (HOSPITAL_COMMUNITY): Payer: Self-pay

## 2017-04-01 ENCOUNTER — Inpatient Hospital Stay (HOSPITAL_COMMUNITY)
Admission: EM | Admit: 2017-04-01 | Discharge: 2017-04-03 | DRG: 394 | Disposition: A | Discharge status: Home / Self Care | Payer: Medicare Other | Attending: Nephrology | Admitting: Nephrology

## 2017-04-01 DIAGNOSIS — K621 Rectal polyp: Secondary | ICD-10-CM | POA: Diagnosis present

## 2017-04-01 DIAGNOSIS — M549 Dorsalgia, unspecified: Secondary | ICD-10-CM | POA: Diagnosis not present

## 2017-04-01 DIAGNOSIS — K922 Gastrointestinal hemorrhage, unspecified: Secondary | ICD-10-CM

## 2017-04-01 DIAGNOSIS — Z9049 Acquired absence of other specified parts of digestive tract: Secondary | ICD-10-CM

## 2017-04-01 DIAGNOSIS — Z8249 Family history of ischemic heart disease and other diseases of the circulatory system: Secondary | ICD-10-CM

## 2017-04-01 DIAGNOSIS — Z91040 Latex allergy status: Secondary | ICD-10-CM

## 2017-04-01 DIAGNOSIS — G2581 Restless legs syndrome: Secondary | ICD-10-CM | POA: Diagnosis not present

## 2017-04-01 DIAGNOSIS — E785 Hyperlipidemia, unspecified: Secondary | ICD-10-CM | POA: Diagnosis not present

## 2017-04-01 DIAGNOSIS — F3189 Other bipolar disorder: Secondary | ICD-10-CM | POA: Diagnosis present

## 2017-04-01 DIAGNOSIS — Z833 Family history of diabetes mellitus: Secondary | ICD-10-CM

## 2017-04-01 DIAGNOSIS — N3020 Other chronic cystitis without hematuria: Secondary | ICD-10-CM | POA: Diagnosis present

## 2017-04-01 DIAGNOSIS — K559 Vascular disorder of intestine, unspecified: Principal | ICD-10-CM | POA: Diagnosis present

## 2017-04-01 DIAGNOSIS — M5030 Other cervical disc degeneration, unspecified cervical region: Secondary | ICD-10-CM | POA: Diagnosis present

## 2017-04-01 DIAGNOSIS — R1310 Dysphagia, unspecified: Secondary | ICD-10-CM | POA: Diagnosis present

## 2017-04-01 DIAGNOSIS — E1143 Type 2 diabetes mellitus with diabetic autonomic (poly)neuropathy: Secondary | ICD-10-CM | POA: Diagnosis present

## 2017-04-01 DIAGNOSIS — R197 Diarrhea, unspecified: Secondary | ICD-10-CM | POA: Diagnosis present

## 2017-04-01 DIAGNOSIS — K588 Other irritable bowel syndrome: Secondary | ICD-10-CM

## 2017-04-01 DIAGNOSIS — I10 Essential (primary) hypertension: Secondary | ICD-10-CM | POA: Diagnosis present

## 2017-04-01 DIAGNOSIS — Z882 Allergy status to sulfonamides status: Secondary | ICD-10-CM

## 2017-04-01 DIAGNOSIS — I2510 Atherosclerotic heart disease of native coronary artery without angina pectoris: Secondary | ICD-10-CM | POA: Diagnosis present

## 2017-04-01 DIAGNOSIS — F411 Generalized anxiety disorder: Secondary | ICD-10-CM

## 2017-04-01 DIAGNOSIS — E669 Obesity, unspecified: Secondary | ICD-10-CM | POA: Diagnosis present

## 2017-04-01 DIAGNOSIS — Z880 Allergy status to penicillin: Secondary | ICD-10-CM

## 2017-04-01 DIAGNOSIS — F17200 Nicotine dependence, unspecified, uncomplicated: Secondary | ICD-10-CM

## 2017-04-01 DIAGNOSIS — K219 Gastro-esophageal reflux disease without esophagitis: Secondary | ICD-10-CM | POA: Diagnosis not present

## 2017-04-01 DIAGNOSIS — M5136 Other intervertebral disc degeneration, lumbar region: Secondary | ICD-10-CM | POA: Diagnosis present

## 2017-04-01 DIAGNOSIS — Z85828 Personal history of other malignant neoplasm of skin: Secondary | ICD-10-CM

## 2017-04-01 DIAGNOSIS — Z825 Family history of asthma and other chronic lower respiratory diseases: Secondary | ICD-10-CM

## 2017-04-01 DIAGNOSIS — K5730 Diverticulosis of large intestine without perforation or abscess without bleeding: Secondary | ICD-10-CM | POA: Diagnosis present

## 2017-04-01 DIAGNOSIS — K5903 Drug induced constipation: Secondary | ICD-10-CM | POA: Diagnosis not present

## 2017-04-01 DIAGNOSIS — Z6828 Body mass index (BMI) 28.0-28.9, adult: Secondary | ICD-10-CM

## 2017-04-01 DIAGNOSIS — Z7982 Long term (current) use of aspirin: Secondary | ICD-10-CM

## 2017-04-01 DIAGNOSIS — T40605A Adverse effect of unspecified narcotics, initial encounter: Secondary | ICD-10-CM | POA: Diagnosis present

## 2017-04-01 DIAGNOSIS — Z888 Allergy status to other drugs, medicaments and biological substances status: Secondary | ICD-10-CM

## 2017-04-01 DIAGNOSIS — M797 Fibromyalgia: Secondary | ICD-10-CM | POA: Diagnosis present

## 2017-04-01 DIAGNOSIS — Z90710 Acquired absence of both cervix and uterus: Secondary | ICD-10-CM

## 2017-04-01 DIAGNOSIS — G40909 Epilepsy, unspecified, not intractable, without status epilepticus: Secondary | ICD-10-CM | POA: Diagnosis not present

## 2017-04-01 DIAGNOSIS — N179 Acute kidney failure, unspecified: Secondary | ICD-10-CM

## 2017-04-01 DIAGNOSIS — K3184 Gastroparesis: Secondary | ICD-10-CM | POA: Diagnosis present

## 2017-04-01 DIAGNOSIS — J449 Chronic obstructive pulmonary disease, unspecified: Secondary | ICD-10-CM | POA: Diagnosis present

## 2017-04-01 DIAGNOSIS — R21 Rash and other nonspecific skin eruption: Secondary | ICD-10-CM | POA: Diagnosis present

## 2017-04-01 DIAGNOSIS — G894 Chronic pain syndrome: Secondary | ICD-10-CM | POA: Diagnosis present

## 2017-04-01 DIAGNOSIS — Z79899 Other long term (current) drug therapy: Secondary | ICD-10-CM

## 2017-04-01 DIAGNOSIS — F17210 Nicotine dependence, cigarettes, uncomplicated: Secondary | ICD-10-CM | POA: Diagnosis not present

## 2017-04-01 DIAGNOSIS — F419 Anxiety disorder, unspecified: Secondary | ICD-10-CM | POA: Diagnosis present

## 2017-04-01 DIAGNOSIS — Z981 Arthrodesis status: Secondary | ICD-10-CM

## 2017-04-01 LAB — CBC WITH DIFFERENTIAL/PLATELET
BASOS PCT: 0 %
BASOS PCT: 0 %
Basophils Absolute: 0 10*3/uL (ref 0.0–0.1)
Basophils Absolute: 0 10*3/uL (ref 0.0–0.1)
EOS ABS: 0 10*3/uL (ref 0.0–0.7)
EOS ABS: 0.1 10*3/uL (ref 0.0–0.7)
EOS PCT: 0 %
Eosinophils Relative: 1 %
HCT: 48.2 % — ABNORMAL HIGH (ref 36.0–46.0)
HCT: 41.8 % (ref 36.0–46.0)
HEMOGLOBIN: 16.3 g/dL — AB (ref 12.0–15.0)
HEMOGLOBIN: 13.9 g/dL (ref 12.0–15.0)
LYMPHS PCT: 7 %
Lymphocytes Relative: 19 %
Lymphs Abs: 1.9 10*3/uL (ref 0.7–4.0)
Lymphs Abs: 3.5 10*3/uL (ref 0.7–4.0)
MCH: 29.3 pg (ref 26.0–34.0)
MCH: 28.5 pg (ref 26.0–34.0)
MCHC: 33.8 g/dL (ref 30.0–36.0)
MCHC: 33.3 g/dL (ref 30.0–36.0)
MCV: 86.7 fL (ref 78.0–100.0)
MCV: 85.7 fL (ref 78.0–100.0)
MONO ABS: 1.4 10*3/uL — AB (ref 0.1–1.0)
MONO ABS: 0.9 10*3/uL (ref 0.1–1.0)
MONOS PCT: 5 %
Monocytes Relative: 5 %
NEUTROS PCT: 75 %
Neutro Abs: 24.4 10*3/uL — ABNORMAL HIGH (ref 1.7–7.7)
Neutro Abs: 13.9 10*3/uL — ABNORMAL HIGH (ref 1.7–7.7)
Neutrophils Relative %: 88 %
PLATELETS: 382 10*3/uL (ref 150–400)
PLATELETS: 333 10*3/uL (ref 150–400)
RBC: 5.56 MIL/uL — ABNORMAL HIGH (ref 3.87–5.11)
RBC: 4.88 MIL/uL (ref 3.87–5.11)
RDW: 14.2 % (ref 11.5–15.5)
RDW: 14 % (ref 11.5–15.5)
WBC: 27.7 10*3/uL — AB (ref 4.0–10.5)
WBC: 18.4 10*3/uL — ABNORMAL HIGH (ref 4.0–10.5)

## 2017-04-01 LAB — POC OCCULT BLOOD, ED: FECAL OCCULT BLD: POSITIVE — AB

## 2017-04-01 LAB — URINALYSIS, ROUTINE W REFLEX MICROSCOPIC
BILIRUBIN URINE: NEGATIVE
Glucose, UA: NEGATIVE mg/dL
Hgb urine dipstick: NEGATIVE
Ketones, ur: NEGATIVE mg/dL
LEUKOCYTES UA: NEGATIVE
Nitrite: NEGATIVE
PH: 5 (ref 5.0–8.0)
Protein, ur: 30 mg/dL — AB
SPECIFIC GRAVITY, URINE: 1.021 (ref 1.005–1.030)

## 2017-04-01 LAB — COMPREHENSIVE METABOLIC PANEL
ALBUMIN: 3 g/dL — AB (ref 3.5–5.0)
ALK PHOS: 48 U/L (ref 38–126)
ALT: 11 U/L — AB (ref 14–54)
AST: 19 U/L (ref 15–41)
Anion gap: 11 (ref 5–15)
BUN: 20 mg/dL (ref 6–20)
CHLORIDE: 98 mmol/L — AB (ref 101–111)
CO2: 22 mmol/L (ref 22–32)
CREATININE: 2.28 mg/dL — AB (ref 0.44–1.00)
Calcium: 8.5 mg/dL — ABNORMAL LOW (ref 8.9–10.3)
GFR calc non Af Amer: 21 mL/min — ABNORMAL LOW (ref >60)
GFR, EST AFRICAN AMERICAN: 24 mL/min — AB (ref >60)
Glucose, Bld: 159 mg/dL — ABNORMAL HIGH (ref 65–99)
Potassium: 5.2 mmol/L — ABNORMAL HIGH (ref 3.5–5.1)
SODIUM: 131 mmol/L — AB (ref 135–145)
Total Bilirubin: 0.4 mg/dL (ref 0.3–1.2)
Total Protein: 6.1 g/dL — ABNORMAL LOW (ref 6.5–8.1)

## 2017-04-01 LAB — I-STAT TROPONIN, ED: Troponin i, poc: 0.02 ng/mL (ref 0.00–0.08)

## 2017-04-01 LAB — I-STAT CG4 LACTIC ACID, ED
Lactic Acid, Venous: 2.15 mmol/L (ref 0.5–1.9)
Lactic Acid, Venous: 1.15 mmol/L (ref 0.5–1.9)

## 2017-04-01 LAB — LIPASE, BLOOD: LIPASE: 36 U/L (ref 11–51)

## 2017-04-01 MED ORDER — SODIUM CHLORIDE 0.9 % IV BOLUS (SEPSIS)
1000.0000 mL | Freq: Once | INTRAVENOUS | Status: AC
  Administered 2017-04-01: 1000 mL via INTRAVENOUS

## 2017-04-01 MED ORDER — LAMOTRIGINE 25 MG PO TABS
25.0000 mg | ORAL_TABLET | Freq: Two times a day (BID) | ORAL | Status: DC
  Administered 2017-04-01 – 2017-04-03 (×4): 25 mg via ORAL
  Filled 2017-04-01 (×4): qty 1

## 2017-04-01 MED ORDER — AMLODIPINE BESYLATE 10 MG PO TABS
10.0000 mg | ORAL_TABLET | Freq: Every day | ORAL | Status: DC
  Administered 2017-04-01 – 2017-04-03 (×3): 10 mg via ORAL
  Filled 2017-04-01 (×3): qty 1

## 2017-04-01 MED ORDER — PANTOPRAZOLE SODIUM 40 MG IV SOLR
40.0000 mg | INTRAVENOUS | Status: DC
  Administered 2017-04-01 – 2017-04-02 (×2): 40 mg via INTRAVENOUS
  Filled 2017-04-01 (×2): qty 40

## 2017-04-01 MED ORDER — LIDOCAINE 5 % EX PTCH
1.0000 | MEDICATED_PATCH | CUTANEOUS | Status: DC
  Administered 2017-04-01 – 2017-04-02 (×2): 1 via TRANSDERMAL
  Filled 2017-04-01 (×2): qty 1

## 2017-04-01 MED ORDER — DULOXETINE HCL 60 MG PO CPEP
60.0000 mg | ORAL_CAPSULE | Freq: Every day | ORAL | Status: DC
  Administered 2017-04-01 – 2017-04-03 (×3): 60 mg via ORAL
  Filled 2017-04-01 (×3): qty 1

## 2017-04-01 MED ORDER — OMEGA-3-ACID ETHYL ESTERS 1 G PO CAPS
2.0000 g | ORAL_CAPSULE | Freq: Every day | ORAL | Status: DC
  Administered 2017-04-02 – 2017-04-03 (×2): 2 g via ORAL
  Filled 2017-04-01 (×3): qty 2

## 2017-04-01 MED ORDER — MORPHINE SULFATE (PF) 4 MG/ML IV SOLN
8.0000 mg | Freq: Once | INTRAVENOUS | Status: AC
  Administered 2017-04-01: 8 mg via INTRAVENOUS
  Filled 2017-04-01: qty 2

## 2017-04-01 MED ORDER — OLANZAPINE 5 MG PO TBDP
5.0000 mg | ORAL_TABLET | Freq: Every day | ORAL | Status: DC
  Filled 2017-04-01: qty 1

## 2017-04-01 MED ORDER — ONDANSETRON HCL 4 MG/2ML IJ SOLN
INTRAMUSCULAR | Status: AC
  Administered 2017-04-01: 4 mg via INTRAVENOUS
  Filled 2017-04-01: qty 2

## 2017-04-01 MED ORDER — FLUTICASONE PROPIONATE 0.005 % EX OINT
1.0000 "application " | TOPICAL_OINTMENT | Freq: Two times a day (BID) | CUTANEOUS | Status: DC
  Administered 2017-04-01 – 2017-04-02 (×3): 1 via TOPICAL
  Filled 2017-04-01: qty 30

## 2017-04-01 MED ORDER — METRONIDAZOLE IN NACL 5-0.79 MG/ML-% IV SOLN
500.0000 mg | Freq: Once | INTRAVENOUS | Status: AC
  Administered 2017-04-01: 500 mg via INTRAVENOUS
  Filled 2017-04-01: qty 100

## 2017-04-01 MED ORDER — HYDROMORPHONE HCL 1 MG/ML IJ SOLN
1.0000 mg | Freq: Once | INTRAMUSCULAR | Status: AC
  Administered 2017-04-01: 1 mg via INTRAVENOUS

## 2017-04-01 MED ORDER — HYDROMORPHONE HCL 1 MG/ML IJ SOLN
1.0000 mg | INTRAMUSCULAR | Status: DC | PRN
  Administered 2017-04-01 – 2017-04-02 (×3): 1 mg via INTRAVENOUS
  Filled 2017-04-01 (×3): qty 1

## 2017-04-01 MED ORDER — ONDANSETRON HCL 4 MG/2ML IJ SOLN
4.0000 mg | Freq: Once | INTRAMUSCULAR | Status: AC
  Administered 2017-04-01: 4 mg via INTRAVENOUS

## 2017-04-01 MED ORDER — PRAVASTATIN SODIUM 40 MG PO TABS
80.0000 mg | ORAL_TABLET | Freq: Every day | ORAL | Status: DC
  Administered 2017-04-02 – 2017-04-03 (×2): 80 mg via ORAL
  Filled 2017-04-01 (×2): qty 2

## 2017-04-01 MED ORDER — CLONAZEPAM 1 MG PO TABS: 1.0000 mg | ORAL_TABLET | Freq: Three times a day (TID) | ORAL | Status: DC

## 2017-04-01 MED ORDER — WHITE PETROLATUM GEL
Status: AC
  Administered 2017-04-01: 21:00:00
  Filled 2017-04-01: qty 1

## 2017-04-01 MED ORDER — BENZTROPINE MESYLATE 0.5 MG PO TABS
0.5000 mg | ORAL_TABLET | Freq: Two times a day (BID) | ORAL | Status: DC
  Administered 2017-04-01 – 2017-04-03 (×4): 0.5 mg via ORAL
  Filled 2017-04-01 (×4): qty 1

## 2017-04-01 MED ORDER — PREGABALIN 75 MG PO CAPS
75.0000 mg | ORAL_CAPSULE | Freq: Two times a day (BID) | ORAL | Status: DC
  Administered 2017-04-01 – 2017-04-03 (×4): 75 mg via ORAL
  Filled 2017-04-01 (×4): qty 1

## 2017-04-01 MED ORDER — ALBUTEROL SULFATE HFA 108 (90 BASE) MCG/ACT IN AERS: 2.0000 | INHALATION_SPRAY | RESPIRATORY_TRACT | Status: DC | PRN

## 2017-04-01 MED ORDER — METRONIDAZOLE IN NACL 5-0.79 MG/ML-% IV SOLN: 500.0000 mg | Freq: Three times a day (TID) | INTRAVENOUS | Status: DC

## 2017-04-01 MED ORDER — CARVEDILOL 12.5 MG PO TABS
12.5000 mg | ORAL_TABLET | Freq: Two times a day (BID) | ORAL | Status: DC
  Administered 2017-04-01 – 2017-04-02 (×2): 12.5 mg via ORAL
  Filled 2017-04-01 (×2): qty 1

## 2017-04-01 MED ORDER — HYDROMORPHONE HCL 1 MG/ML IJ SOLN
INTRAMUSCULAR | Status: AC
  Filled 2017-04-01: qty 1

## 2017-04-01 MED ORDER — LEVOFLOXACIN IN D5W 750 MG/150ML IV SOLN
750.0000 mg | Freq: Once | INTRAVENOUS | Status: AC
  Administered 2017-04-01: 750 mg via INTRAVENOUS
  Filled 2017-04-01: qty 150

## 2017-04-01 MED ORDER — MELATONIN 3 MG PO TABS: 6.0000 mg | ORAL_TABLET | Freq: Every day | ORAL | Status: DC

## 2017-04-01 MED ORDER — ALBUTEROL SULFATE (2.5 MG/3ML) 0.083% IN NEBU: 2.5000 mg | INHALATION_SOLUTION | Freq: Four times a day (QID) | RESPIRATORY_TRACT | Status: DC | PRN

## 2017-04-01 MED ORDER — NITROGLYCERIN 0.4 MG SL SUBL: 0.4000 mg | SUBLINGUAL_TABLET | SUBLINGUAL | Status: DC | PRN

## 2017-04-01 MED ORDER — METRONIDAZOLE IN NACL 5-0.79 MG/ML-% IV SOLN
500.0000 mg | Freq: Three times a day (TID) | INTRAVENOUS | Status: DC
  Administered 2017-04-01 – 2017-04-02 (×3): 500 mg via INTRAVENOUS
  Filled 2017-04-01 (×5): qty 100

## 2017-04-01 MED ORDER — LEVOFLOXACIN IN D5W 500 MG/100ML IV SOLN: 500.0000 mg | INTRAVENOUS | Status: DC

## 2017-04-01 MED ORDER — LORATADINE 10 MG PO TABS
10.0000 mg | ORAL_TABLET | Freq: Every day | ORAL | Status: DC
  Administered 2017-04-02 – 2017-04-03 (×2): 10 mg via ORAL
  Filled 2017-04-01 (×2): qty 1

## 2017-04-01 NOTE — Progress Notes (Signed)
Patient arrived to 6n08 from [facility] and oriented, moderate pain will medicate per orders, two IV's noted and saline locked, belongings with patient. No visitors at the moment, oriented to room and staff, will continue to monitor.

## 2017-04-01 NOTE — Progress Notes (Signed)
GI PRELIMINARY NOTE  I am aware of patient and will be seeing her a little bit later this evening. In the meantime, call if immediate input is needed.  Cleotis Nipper, M.D. Pager [phone] If no answer or after 5 PM call [phone]

## 2017-04-01 NOTE — ED Triage Notes (Signed)
Pt reports N/V/D x one week with blood in stool and emesis that started 2 hours ago. EMS reports pt had dry heaves when arriving on scene that resolved with Zofran. EMS reports evidence of diarrhea on scene with no visual evidence of blood in stool or emesis. Pt reports pain "all over".

## 2017-04-01 NOTE — H&P (Signed)
History and Physical    MATA ROWEN NTI:[id number] DOB: Jan 11, 1946 DOA: 04/01/2017  PCP: Precious Gilding, PA  Patient coming from:  Home.    Chief Complaint:   Severe abdominal pain and bloody diarrhea.   HPI: [name] is an 71 y.o. female with multiple medical problems including bipolar disorder, anxiety, asthma, COPD and actively smoking, chronic back pain on percocet 10mg  TID, HTN, hx of leukocytosis, seizure hx, DM 2, presented to the ER with severe abdominal, bloody diarrhea, and weakness.  She has no nausea or vomiting, recently on Bactrim, but no steroids.  She had chronic intermittent leukocytosis in the past, and had seen hematology, felt to be due to ongoing cigarettes.  Evaluation in the ER included a Cr of 2.5, baseline normal, along with CT of the abdomen without contrast, failed to show any etiology for her symptoms, marked leukocytosis with WBC of 27K, and lactic acid of 2.1.  She was not hypotensive, but was having severe abdominal pain.  GI was consulted, and recommended that is ischemic colitis is a consideration, that she be transferred to [facility] for potential interventional radiology support.  Hospitalist was asked to admit her.  Dr Bouchini of GI at [facility] was also made aware of her admission.    ED Course:  See above.  Rewiew of Systems:  Constitutional: Negative for malaise, fever and chills. No significant weight loss or weight gain Eyes: Negative for eye pain, redness and discharge, diplopia, visual changes, or flashes of light. ENMT: Negative for ear pain, hoarseness, nasal congestion, sinus pressure and sore throat. No headaches; tinnitus, drooling, or problem swallowing. Cardiovascular: Negative for chest pain, palpitations, diaphoresis, dyspnea and peripheral edema. ; No orthopnea, PND Respiratory: Negative for cough, hemoptysis, wheezing and stridor. No pleuritic chestpain. Gastrointestinal: Negative for diarrhea, constipation,  melena, blood in stool,  hematemesis, jaundice and rectal bleeding.    Genitourinary: Negative for frequency, dysuria, incontinence,flank pain and hematuria; Musculoskeletal: Negative for back pain and neck pain. Negative for swelling and trauma.;  Skin: . Negative for pruritus, rash, abrasions, bruising and skin lesion.; ulcerations Neuro: Negative for headache, lightheadedness and neck stiffness. Negative for weakness, altered level of consciousness , altered mental status, extremity weakness, burning feet, involuntary movement, seizure and syncope.  Psych: negative for anxiety, depression, insomnia, tearfulness, panic attacks, hallucinations, paranoia, suicidal or homicidal ideation   Past Medical History:  Diagnosis Date  . Anxiety   . Asthma   . Bipolar disorder ([place])    Psych hosp admission 08/2012--manic episode.  Involuntary committment 10/12/14-manic/psychotic.  Marland Kitchen CAD (coronary artery disease)    Moderate disease of the left circumflex managed medically  . Chronic cystitis   . Chronic pain   . COPD (chronic obstructive pulmonary disease) ([place])   . DDD (degenerative disc disease), cervical   . DDD (degenerative disc disease), lumbar    MRI 12/2013: right foraminal disc protrusion L4-5, mild facet hypertrophy L5-S1--gets ESI's by Dr. Vira Blanco  . Diverticul disease small and large intestine, no perforati or abscess   . Fibromyalgia   . Gastroparesis    abnl gastric emptying scan 2010 (Dr. Deatra Ina)  . GERD (gastroesophageal reflux disease)   . Hyperlipidemia   . Hypertension   . Leukocytosis    chronic, mild, s/p eval 03/2013 by hematologist --reassured; likely secondary to ongoing smoking  . Muscle spasm   . Neurotic excoriations 01/28/2013  . Obesity   . Rectal prolapse   . Right knee pain Summer 2014   MRI 04/2013: large popliteal  fossa ganglion cyst, mod medial compartment and patellofemoral osteoarth--referred to [facility] at that time (Dr. Berenice Primas)  . Seizure ([place])   . Skin cancer   . Tobacco  user   . Type II or unspecified type diabetes mellitus with neurological manifestations, not stated as uncontrolled(250.60) 08/17/2008   Hx of diabetic foot ulcer.  . Urge and stress incontinence     Rewiew of Systems:  Constitutional: Negative for malaise, fever and chills. No significant weight loss or weight gain Eyes: Negative for eye pain, redness and discharge, diplopia, visual changes, or flashes of light. ENMT: Negative for ear pain, hoarseness, nasal congestion, sinus pressure and sore throat. No headaches; tinnitus, drooling, or problem swallowing. Cardiovascular: Negative for chest pain, palpitations, diaphoresis, dyspnea and peripheral edema. ; No orthopnea, PND Respiratory: Negative for cough, hemoptysis, wheezing and stridor. No pleuritic chestpain. Gastrointestinal: Negative for nausea, vomiting, diarrhea, constipation, abdominal pain, melena, blood in stool, hematemesis, jaundice and rectal bleeding.    Genitourinary: Negative for frequency, dysuria, incontinence,flank pain and hematuria; Musculoskeletal: Negative for back pain and neck pain. Negative for swelling and trauma.;  Skin: . Negative for pruritus, rash, abrasions, bruising and skin lesion.; ulcerations Neuro: Negative for headache, lightheadedness and neck stiffness. Negative for weakness, altered level of consciousness , altered mental status, extremity weakness, burning feet, involuntary movement, seizure and syncope.  Psych: negative for anxiety, depression, insomnia, tearfulness, panic attacks, hallucinations, paranoia, suicidal or homicidal ideation   Past Surgical History:  Procedure Laterality Date  . ABDOMINAL HYSTERECTOMY    . ANTERIOR CERVICAL DECOMP/DISCECTOMY FUSION     C5-C7 levels  . APPENDECTOMY    . BLADDER SURGERY    . CARDIAC CATHETERIZATION  08/2007   nonobstructive dz except 75% obstruction in OM, EF 60%--med mgmt  . CARDIOVASCULAR STRESS TEST  2002; 12/2008; 03/2012   2002: adenosine myoview  NORMAL.  2010-Dr. Crenshaw: NORMAL adenosine myoview, no sign of scar or ischemia, EF normal.  2013-Dr. Nishan: NORMAL Lexiscan stress test, normal LV fxn/EF, normal wall motion  . CHOLECYSTECTOMY    . CHOLECYSTECTOMY    . COLONOSCOPY W/ BIOPSIES  05/2009   Dr. Frederick Peers (bx showed benign colonic mucosa)  . COLONOSCOPY W/ BIOPSIES  09/2002   Dr. Frederick Peers (  . COSMETIC EAR SURGERY    . ESOPHAGOGASTRODUODENOSCOPY  05/2011   Dr. Vashti Hey at [facility]--s/p dilatation, o/w normal exam.  . EYE SURGERY    . RECTOCELE REPAIR    . RIGHT FOOT SURGERY    . TUBAL LIGATION    . VAGIOCELE       reports that she has been smoking Cigarettes.  She has been smoking about 1.50 packs per day. She has never used smokeless tobacco. She reports that she does not drink alcohol or use drugs.  Allergies  Allergen Reactions  . Dust Mite Extract   . Januvia [Sitagliptin Phosphate] Nausea Only  . Latex   . Metformin Nausea And Vomiting    dizziness  . Metoclopramide Hcl     Unknown   . Penicillins     Has patient had a PCN reaction causing immediate rash, facial/tongue/throat swelling, SOB or lightheadedness with hypotension: Yes Has patient had a PCN reaction causing severe rash involving mucus membranes or skin necrosis: No Has patient had a PCN reaction that required hospitalization No Has patient had a PCN reaction occurring within the last 10 years: Yes If all of the above answers are "NO", then may proceed with Cephalosporin use.   . Sulfonamide Derivatives  REACTION: Reaction not known    Family History  Problem Relation Age of Onset  . Heart attack Sister   . Cancer Sister   . Asthma Sister   . Cancer Mother   . Diabetes Brother   . Heart attack Brother   . Asthma Brother   . Heart disease Brother      Prior to Admission medications   Medication Sig Start Date End Date Taking? Authorizing Provider  albuterol (PROVENTIL HFA;VENTOLIN HFA) 108 (90 Base)  MCG/ACT inhaler Inhale 2 puffs into the lungs every 4 (four) hours as needed for wheezing or shortness of breath. 09/01/16  Yes Jola Schmidt, MD  albuterol (PROVENTIL) (2.5 MG/3ML) 0.083% nebulizer solution Take 3 mLs (2.5 mg total) by nebulization every 6 (six) hours as needed for wheezing. 07/25/14  Yes Kerrie Buffalo, NP  albuterol-ipratropium (COMBIVENT) 18-103 MCG/ACT inhaler Inhale 1-2 puffs into the lungs every 6 (six) hours as needed for wheezing or shortness of breath.  06/28/15  Yes [provider]  amLODipine (NORVASC) 10 MG tablet Take 10 mg by mouth daily.  06/28/15  Yes [provider]  aspirin 81 MG EC tablet Take 81 mg by mouth daily.  06/28/15  Yes [provider]  benazepril (LOTENSIN) 20 MG tablet TAKE ONE TABLET BY MOUTH DAILY FOR HIGH BLOOD PRESSURE. 06/28/15  Yes [provider]  benztropine (COGENTIN) 0.5 MG tablet Take 1 tablet (0.5 mg total) by mouth 2 (two) times daily. 06/19/15  Yes Samuella Cota, MD  carvedilol (COREG) 12.5 MG tablet Take 12.5 mg by mouth 2 (two) times daily with a meal.  04/10/15  Yes [provider]  diclofenac sodium (VOLTAREN) 1 % GEL Apply 2g to affected joint up to 4 times daily as needed Patient taking differently: Apply 2 g topically 4 (four) times daily as needed. Apply 2g to affected joint up to 4 times daily as needed 08/30/14  Yes McGowen, Adrian Blackwater, MD  DULoxetine (CYMBALTA) 60 MG capsule TAKE ONE CAPSULE BY MOUTH DAILY FOR ANXIETY AND DEPRESSION. 01/04/15  Yes McGowen, Adrian Blackwater, MD  estradiol (ESTRACE) 0.1 MG/GM vaginal cream Insert one applicatorful vaginally once daily 06/28/15  Yes [provider]  fexofenadine (ALLEGRA) 60 MG tablet take one tablet by mouth twice daily as needed for nasal allergies 06/28/15  Yes [provider]  fish oil-omega-3 fatty acids 1000 MG capsule Take 2 capsules (2 g total) by mouth daily. For lipid control. 08/15/12  Yes Mashburn, Milta Deiters T, PA-C    fluticasone (CUTIVATE) 0.05 % cream Apply 1 application topically 2 (two) times daily.  06/12/15  Yes [provider]  lamoTRIgine (LAMICTAL) 25 MG tablet TAKE 1 TABLET BY MOUTH TWICE A DAY. 10/18/14  Yes McGowen, Adrian Blackwater, MD  LYRICA 75 MG capsule Take 1 capsule by mouth 2 (two) times daily. 03/14/17  Yes [provider]  oxyCODONE-acetaminophen (PERCOCET) 10-325 MG tablet Take 2 tablets by mouth every 8 (eight) hours as needed for pain. Then take 1 tablet extra daily for break through pain. 05/15/15  Yes [provider]  pravastatin (PRAVACHOL) 80 MG tablet Take 1 tablet (80 mg total) by mouth daily. 07/25/14  Yes Kerrie Buffalo, NP  Vitamin D, Ergocalciferol, (DRISDOL) 50000 UNITS CAPS capsule Take 50,000 Units by mouth every 7 (seven) days.  06/28/15  Yes [provider]  clonazePAM (KLONOPIN) 1 MG tablet Take 1 mg by mouth 3 (three) times daily. 06/28/15   [provider]  glimepiride (AMARYL) 2 MG tablet Take  2 mg by mouth daily with breakfast.  04/10/15   [provider]  lidocaine (LIDODERM) 5 % Place 1 patch onto the skin daily.  06/06/15   [provider]  Melatonin 3 MG TABS Take 6 mg by mouth at bedtime.  06/28/15   [provider]  metoprolol succinate (TOPROL-XL) 50 MG 24 hr tablet TAKE ONE TABLET BY MOUTH EVERY DAY WITH OR IMMEDIATELY FOLLOWING A MEAL. Patient not taking: Reported on 04/01/2017 09/22/14   Tammi Sou, MD  nitroGLYCERIN (NITROSTAT) 0.4 MG SL tablet Place 1 tablet (0.4 mg total) under the tongue every 5 (five) minutes as needed for chest pain. 07/25/14   Kerrie Buffalo, NP  OLANZapine zydis (ZYPREXA) 5 MG disintegrating tablet Take 1 tablet (5 mg total) by mouth at bedtime. Patient not taking: Reported on 04/01/2017 06/19/15   Samuella Cota, MD  omeprazole (PRILOSEC) 20 MG capsule TAKE (1) CAPSULE BY MOUTH TWICE DAILY. 09/22/14   McGowen, Adrian Blackwater, MD  traZODone (DESYREL) 50 MG tablet Take 50 mg  by mouth at bedtime.  06/28/15 07/28/15  [provider]    Physical Exam: Vitals:   04/01/17 0803 04/01/17 0830 04/01/17 0900 04/01/17 0930  BP: (!) 155/61 136/67 (!) 149/65 (!) 120/57  Pulse: 96 97 95 99  Resp: (!) 25 17 16 18   Temp:      TempSrc:      SpO2: 93% 94% 95% 96%  Weight:      Height:       Constitutional: NAD, calm, comfortable Vitals:   04/01/17 0803 04/01/17 0830 04/01/17 0900 04/01/17 0930  BP: (!) 155/61 136/67 (!) 149/65 (!) 120/57  Pulse: 96 97 95 99  Resp: (!) 25 17 16 18   Temp:      TempSrc:      SpO2: 93% 94% 95% 96%  Weight:      Height:       Eyes: PERRL, lids and conjunctivae normal ENMT: Mucous membranes are moist. Posterior pharynx clear of any exudate or lesions.Normal dentition.  Neck: normal, supple, no masses, no thyromegaly Respiratory: clear to auscultation bilaterally, no wheezing, no crackles. Normal respiratory effort. No accessory muscle use.  Cardiovascular: Regular rate and rhythm, no murmurs / rubs / gallops. No extremity edema. 2+ pedal pulses. No carotid bruits.  Abdomen: no tenderness, no masses palpated. No hepatosplenomegaly. Bowel sounds positive.  Musculoskeletal: no clubbing / cyanosis. No joint deformity upper and lower extremities. Good ROM, no contractures. Normal muscle tone.  Skin: no rashes, lesions, ulcers. No induration Neurologic: CN 2-12 grossly intact. Sensation intact, DTR normal. Strength 5/5 in all 4.  Psychiatric: Normal judgment and insight. Alert and oriented x 3. Normal mood.    Labs on Admission: I have personally reviewed following labs and imaging studies  CBC:  Recent Labs Lab 04/01/17 0735  WBC 27.7*  NEUTROABS 24.4*  HGB 16.3*  HCT 48.2*  MCV 86.7  PLT 599   Basic Metabolic Panel:  Recent Labs Lab 04/01/17 0735  NA 131*  K 5.2*  CL 98*  CO2 22  GLUCOSE 159*  BUN 20  CREATININE 2.28*  CALCIUM 8.5*   GFR: Estimated Creatinine Clearance: 24.2 mL/min (A) (by C-G formula  based on SCr of 2.28 mg/dL (H)). Liver Function Tests:  Recent Labs Lab 04/01/17 0735  AST 19  ALT 11*  ALKPHOS 48  BILITOT 0.4  PROT 6.1*  ALBUMIN 3.0*    Recent Labs Lab 04/01/17 0735  LIPASE 36   Urine analysis:  Component Value Date/Time   COLORURINE YELLOW 06/16/2015 0835   APPEARANCEUR CLEAR 06/16/2015 0835   LABSPEC 1.010 06/16/2015 0835   PHURINE 7.0 06/16/2015 0835   GLUCOSEU NEGATIVE 06/16/2015 0835   GLUCOSEU NEGATIVE 05/30/2009 1437   HGBUR NEGATIVE 06/16/2015 0835   BILIRUBINUR NEGATIVE 06/16/2015 0835   BILIRUBINUR small 03/24/2014 1455   [place] 06/16/2015 0835   PROTEINUR NEGATIVE 06/16/2015 0835   UROBILINOGEN 0.2 06/16/2015 0835   NITRITE NEGATIVE 06/16/2015 0835   LEUKOCYTESUR NEGATIVE 06/16/2015 0835    Recent Results (from the past 240 hour(s))  Blood Culture (routine x 2)     Status: None (Preliminary result)   Collection Time: 04/01/17  8:41 AM  Result Value Ref Range Status   Specimen Description BLOOD LEFT HAND  Final   Special Requests   Final    BOTTLES DRAWN AEROBIC AND ANAEROBIC Blood Culture results may not be optimal due to an inadequate volume of blood received in culture bottles   Culture PENDING  Incomplete   Report Status PENDING  Incomplete  Blood Culture (routine x 2)     Status: None (Preliminary result)   Collection Time: 04/01/17  8:50 AM  Result Value Ref Range Status   Specimen Description BLOOD RIGHT HAND  Final   Special Requests   Final    BOTTLES DRAWN AEROBIC AND ANAEROBIC Blood Culture adequate volume   Culture PENDING  Incomplete   Report Status PENDING  Incomplete     Radiological Exams on Admission: Ct Abdomen Pelvis Wo Contrast  Result Date: 04/01/2017 CLINICAL DATA:  Lower abdominal pain, back pain EXAM: CT ABDOMEN AND PELVIS WITHOUT CONTRAST TECHNIQUE: Multidetector CT imaging of the abdomen and pelvis was performed following the standard protocol without IV contrast. COMPARISON:  06/16/2015  FINDINGS: Lower chest: Lung bases are clear. No effusions. Heart is normal size. Hepatobiliary: Prior cholecystectomy. No focal hepatic abnormality. Common bile duct dilatation to 16 mm, likely related to post cholecystectomy state, unchanged since prior study. Pancreas: No focal abnormality or ductal dilatation. Spleen: No focal abnormality.  Normal size. Adrenals/Urinary Tract: No adrenal abnormality. No focal renal abnormality. No stones or hydronephrosis. Urinary bladder is unremarkable. Stomach/Bowel: Few scattered sigmoid diverticula. No active diverticulitis. Stomach, large and small bowel grossly unremarkable. Vascular/Lymphatic: Diffuse aortic and iliac calcifications. No aneurysm or adenopathy. Reproductive: Prior hysterectomy.  No adnexal masses. Other: No free fluid or free air. Musculoskeletal: No acute bony abnormality. IMPRESSION: No acute findings in the abdomen or pelvis. Stable biliary ductal dilatation following cholecystectomy. Aortoiliac atherosclerosis. Sigmoid diverticulosis.  No active diverticulitis. Electronically Signed   By: Rolm Baptise M.D.   On: 04/01/2017 09:45    EKG: Independently reviewed.   Assessment/Plan Principal Problem:   Ischemic colitis ([place]) Active Problems:   OBESITY   TOBACCO ABUSE   Essential hypertension   Chronic pain syndrome   Restless leg syndrome   Bloody diarrhea   AKI (acute kidney injury) ([place])   PLAN:   Bloody diarrhea:  She was given Bactrim, and with marked leukocytosis, she could have C diff.  Will check for it.  With hemorrhagic diarrhea and AKI, she could have hemorrhagic E coli infection.  Most likely, however, is ischemic colitis, given her pain being out of proportion with her exam, and that she is still smoking.  Will give IVF, NPO, and give some pain medication.  Have consulted GI for further recommendation.    AKI;  Suspect prerenal.  She could have E Coli O157 causing AKi with hemorrhagic diarrhea  as well.  Give fluid.  Avoid  nephrotoxic drug.  Follow daily Cr.   Chronic narcotic use:  For back pain.  Noted.  HTN:  Hold ACE I.  Hold some BP meds.  Continue with BB.  Tobacco abuse:  Advise stop.    DVT prophylaxis: SCD.  Code Status: FULL CODE>  Family Communication: None at bedside.  Disposition Plan: Home.  Consults called: GI.  Admission status: Inpatient.    Harlo Fabela MD FACP. [facility]  If 7PM-7AM, please contact night-coverage [email or web address] Password [email or web address]  04/01/2017, 10:59 AM

## 2017-04-01 NOTE — Progress Notes (Signed)
Pharmacy Note:  Initial antibiotics for Flagyl and Levaquin ordered by EDP for intra-abdominal infection.  Estimated Creatinine Clearance: 24.2 mL/min (A) (by C-G formula based on SCr of 2.28 mg/dL (H)).   Allergies  Allergen Reactions  . Dust Mite Extract   . Januvia [Sitagliptin Phosphate] Nausea Only  . Latex   . Metformin Nausea And Vomiting    dizziness  . Metoclopramide Hcl     Unknown   . Penicillins     Has patient had a PCN reaction causing immediate rash, facial/tongue/throat swelling, SOB or lightheadedness with hypotension: Yes Has patient had a PCN reaction causing severe rash involving mucus membranes or skin necrosis: No Has patient had a PCN reaction that required hospitalization No Has patient had a PCN reaction occurring within the last 10 years: Yes If all of the above answers are "NO", then may proceed with Cephalosporin use.   . Sulfonamide Derivatives     REACTION: Reaction not known    Vitals:   04/01/17 1230 04/01/17 1300  BP: 118/75 127/70  Pulse: (!) 101 100  Resp: 15 17  Temp:      Anti-infectives    Start     Dose/Rate Route Frequency Ordered Stop   04/01/17 1800  metroNIDAZOLE (FLAGYL) IVPB 500 mg     500 mg 100 mL/hr over 60 Minutes Intravenous Every 8 hours 04/01/17 1017     04/01/17 0845  levofloxacin (LEVAQUIN) IVPB 750 mg     750 mg 100 mL/hr over 90 Minutes Intravenous  Once 04/01/17 0834 04/01/17 1030   04/01/17 0845  metroNIDAZOLE (FLAGYL) IVPB 500 mg     500 mg 100 mL/hr over 60 Minutes Intravenous  Once 04/01/17 0834 04/01/17 1000     Plan: Initial doses of Levaquin 750mg  x 1 and Flagyl 500mg  IV q8h has been ordered. F/U admission orders for further dosing if therapy continued.  Ena Dawley, [facility] 04/01/2017 2:23 PM

## 2017-04-01 NOTE — ED Provider Notes (Signed)
[facility] DEPT Provider Note   CSN: [id number] Arrival date & time: 04/01/17  1540     History   Chief Complaint Chief Complaint  Patient presents with  . N/V/D and pain all over    HPI [name] is a 71 y.o. female.  HPI Pt with hx of CAD, COPD, diverticulosis comes in with cc of abd pain. Pt smokes 1.5 ppd and has been a smoker for more than 50 years. Pt reports that she started having pain 2 days ago. Her pain is in her abd pain, back, and rectal region. Pt has had loose BM, she noted blood this morning. Pt has no chest pain or dib. Pt has chronic back pain and continues to have back pain. Pt has new associated numbness, weakness, urinary incontinence, urinary retention, bowel incontinence, pins and needle sensation in the perineal area. She does indicate intermittent numbness in her legs and tongue over the course of last several days.    Past Medical History:  Diagnosis Date  . Anxiety   . Asthma   . Bipolar disorder ([place])    Psych hosp admission 08/2012--manic episode.  Involuntary committment 10/12/14-manic/psychotic.  Marland Kitchen CAD (coronary artery disease)    Moderate disease of the left circumflex managed medically  . Chronic cystitis   . Chronic pain   . COPD (chronic obstructive pulmonary disease) ([place])   . DDD (degenerative disc disease), cervical   . DDD (degenerative disc disease), lumbar    MRI 12/2013: right foraminal disc protrusion L4-5, mild facet hypertrophy L5-S1--gets ESI's by Dr. Vira Blanco  . Diverticul disease small and large intestine, no perforati or abscess   . Fibromyalgia   . Gastroparesis    abnl gastric emptying scan 2010 (Dr. Deatra Ina)  . GERD (gastroesophageal reflux disease)   . Hyperlipidemia   . Hypertension   . Leukocytosis    chronic, mild, s/p eval 03/2013 by hematologist --reassured; likely secondary to ongoing smoking  . Muscle spasm   . Neurotic excoriations 01/28/2013  . Obesity   . Rectal prolapse   . Right knee pain Summer  2014   MRI 04/2013: large popliteal fossa ganglion cyst, mod medial compartment and patellofemoral osteoarth--referred to [facility] at that time (Dr. Berenice Primas)  . Seizure ([place])   . Skin cancer   . Tobacco user   . Type II or unspecified type diabetes mellitus with neurological manifestations, not stated as uncontrolled(250.60) 08/17/2008   Hx of diabetic foot ulcer.  . Urge and stress incontinence     Patient Active Problem List   Diagnosis Date Noted  . Bipolar 1 disorder, mixed, moderate ([place]) 06/17/2015  . Acute encephalopathy 06/16/2015  . Hallucination 06/16/2015  . Bipolar I disorder, most recent episode depressed ([place]) 07/18/2014  . Psychosis 07/17/2014  . Hypokalemia   . Fibromyalgia 07/09/2014  . Restless leg syndrome 07/09/2014  . Chest pain 07/09/2014  . Altered mental status 07/08/2014  . Personal history of diabetic foot ulcer 04/15/2014  . Palpitations 03/24/2014  . Chronic nausea 03/24/2014  . Dysuria 03/24/2014  . Chronic pain syndrome 10/21/2013  . HTN (hypertension), benign 10/21/2013  . Chronic instability of right knee 08/02/2013  . COPD with acute exacerbation ([place]) 07/26/2013  . Chronic pain of right knee 07/12/2013  . Acute pain of right knee 01/28/2013  . Lymphocytosis 01/28/2013  . Contusion, hip 01/28/2013  . Atrophic vaginitis 01/28/2013  . Neurotic excoriations 01/28/2013  . COPD (chronic obstructive pulmonary disease) ([place]) 03/03/2012  . Elevated fasting blood sugar 11/24/2011  .  Chronic pain 09/13/2011  . Cervical osteoarthritis 09/13/2011  . H/O surgical procedure 09/13/2011  . H/O: hysterectomy 09/13/2011  . History of cholecystectomy 09/13/2011  . BP (high blood pressure) 09/13/2011  . HLD (hyperlipidemia) 09/13/2011  . LBP (low back pain) 09/13/2011  . Post-operative state 09/13/2011  . Constipation 05/08/2011  . NECK PAIN, CHRONIC 08/30/2010  . FULL INCONTINENCE OF FECES 04/03/2010  . Anxiety state 11/30/2009  . GASTROPARESIS  11/30/2009  . CYSTITIS, CHRONIC 11/30/2009  . OVERACTIVE BLADDER 11/30/2009  . BREAST MASS, LEFT 11/30/2009  . URINARY INCONTINENCE, STRESS, FEMALE 11/30/2009  . URGE INCONTINENCE 11/30/2009  . ABDOMINAL PAIN-MULTIPLE SITES 03/17/2009  . SKIN RASH, ALLERGIC 10/27/2008  . Type II or unspecified type diabetes mellitus with neurological manifestations, not stated as uncontrolled(250.60) 08/17/2008  . MORBID OBESITY 07/07/2008  . ASTHMA UNSPECIFIED WITH EXACERBATION 06/22/2008  . CERVICAL RADICULOPATHY 04/18/2008  . LEG PAIN, BILATERAL 04/18/2008  . DIZZINESS 04/18/2008  . RASH-NONVESICULAR 03/02/2008  . Other and unspecified hyperlipidemia 11/12/2007  . OBESITY 11/12/2007  . SINUSITIS, CHRONIC 11/12/2007  . ESOPHAGITIS 11/12/2007  . RECTOCELE WITHOUT MENTION OF UTERINE PROLAPSE 11/12/2007  . ARTHRITIS 11/12/2007  . Diarrhea 11/12/2007  . CAD 09/01/2007  . LOW BACK PAIN, CHRONIC 08/13/2007  . HYPONATREMIA 07/06/2007  . IRRITABLE BOWEL SYNDROME 07/06/2007  . FIBROMYALGIA 07/06/2007  . TOBACCO ABUSE 04/03/2007  . DEPRESSION 04/03/2007  . Essential hypertension 04/03/2007  . ASTHMA 04/03/2007  . GERD 04/03/2007  . SEIZURE DISORDER 04/03/2007    Past Surgical History:  Procedure Laterality Date  . ABDOMINAL HYSTERECTOMY    . ANTERIOR CERVICAL DECOMP/DISCECTOMY FUSION     C5-C7 levels  . APPENDECTOMY    . BLADDER SURGERY    . CARDIAC CATHETERIZATION  08/2007   nonobstructive dz except 75% obstruction in OM, EF 60%--med mgmt  . CARDIOVASCULAR STRESS TEST  2002; 12/2008; 03/2012   2002: adenosine myoview NORMAL.  2010-Dr. Crenshaw: NORMAL adenosine myoview, no sign of scar or ischemia, EF normal.  2013-Dr. Nishan: NORMAL Lexiscan stress test, normal LV fxn/EF, normal wall motion  . CHOLECYSTECTOMY    . CHOLECYSTECTOMY    . COLONOSCOPY W/ BIOPSIES  05/2009   Dr. Frederick Peers (bx showed benign colonic mucosa)  . COLONOSCOPY W/ BIOPSIES  09/2002   Dr. Frederick Peers  (  . COSMETIC EAR SURGERY    . ESOPHAGOGASTRODUODENOSCOPY  05/2011   Dr. Vashti Hey at [facility]--s/p dilatation, o/w normal exam.  . EYE SURGERY    . RECTOCELE REPAIR    . RIGHT FOOT SURGERY    . TUBAL LIGATION    . VAGIOCELE      OB History    No data available       Home Medications    Prior to Admission medications   Medication Sig Start Date End Date Taking? Authorizing Provider  albuterol (PROVENTIL HFA;VENTOLIN HFA) 108 (90 Base) MCG/ACT inhaler Inhale 2 puffs into the lungs every 4 (four) hours as needed for wheezing or shortness of breath. 09/01/16  Yes Jola Schmidt, MD  albuterol (PROVENTIL) (2.5 MG/3ML) 0.083% nebulizer solution Take 3 mLs (2.5 mg total) by nebulization every 6 (six) hours as needed for wheezing. 07/25/14  Yes Kerrie Buffalo, NP  albuterol-ipratropium (COMBIVENT) 18-103 MCG/ACT inhaler Inhale 1-2 puffs into the lungs every 6 (six) hours as needed for wheezing or shortness of breath.  06/28/15  Yes [provider]  amLODipine (NORVASC) 10 MG tablet Take 10 mg by mouth daily.  06/28/15  Yes [provider]  aspirin 81 MG EC  tablet Take 81 mg by mouth daily.  06/28/15  Yes [provider]  benazepril (LOTENSIN) 20 MG tablet TAKE ONE TABLET BY MOUTH DAILY FOR HIGH BLOOD PRESSURE. 06/28/15  Yes [provider]  benztropine (COGENTIN) 0.5 MG tablet Take 1 tablet (0.5 mg total) by mouth 2 (two) times daily. 06/19/15  Yes Samuella Cota, MD  carvedilol (COREG) 12.5 MG tablet Take 12.5 mg by mouth 2 (two) times daily with a meal.  04/10/15  Yes [provider]  diclofenac sodium (VOLTAREN) 1 % GEL Apply 2g to affected joint up to 4 times daily as needed Patient taking differently: Apply 2 g topically 4 (four) times daily as needed. Apply 2g to affected joint up to 4 times daily as needed 08/30/14  Yes McGowen, Adrian Blackwater, MD  DULoxetine (CYMBALTA) 60 MG capsule TAKE ONE CAPSULE BY MOUTH DAILY FOR ANXIETY AND  DEPRESSION. 01/04/15  Yes McGowen, Adrian Blackwater, MD  estradiol (ESTRACE) 0.1 MG/GM vaginal cream Insert one applicatorful vaginally once daily 06/28/15  Yes [provider]  fexofenadine (ALLEGRA) 60 MG tablet take one tablet by mouth twice daily as needed for nasal allergies 06/28/15  Yes [provider]  fish oil-omega-3 fatty acids 1000 MG capsule Take 2 capsules (2 g total) by mouth daily. For lipid control. 08/15/12  Yes Mashburn, Milta Deiters T, PA-C  fluticasone (CUTIVATE) 0.05 % cream Apply 1 application topically 2 (two) times daily.  06/12/15  Yes [provider]  lamoTRIgine (LAMICTAL) 25 MG tablet TAKE 1 TABLET BY MOUTH TWICE A DAY. 10/18/14  Yes McGowen, Adrian Blackwater, MD  LYRICA 75 MG capsule Take 1 capsule by mouth 2 (two) times daily. 03/14/17  Yes [provider]  oxyCODONE-acetaminophen (PERCOCET) 10-325 MG tablet Take 2 tablets by mouth every 8 (eight) hours as needed for pain. Then take 1 tablet extra daily for break through pain. 05/15/15  Yes [provider]  pravastatin (PRAVACHOL) 80 MG tablet Take 1 tablet (80 mg total) by mouth daily. 07/25/14  Yes Kerrie Buffalo, NP  Vitamin D, Ergocalciferol, (DRISDOL) 50000 UNITS CAPS capsule Take 50,000 Units by mouth every 7 (seven) days.  06/28/15  Yes [provider]  clonazePAM (KLONOPIN) 1 MG tablet Take 1 mg by mouth 3 (three) times daily. 06/28/15   [provider]  glimepiride (AMARYL) 2 MG tablet Take 2 mg by mouth daily with breakfast.  04/10/15   [provider]  lidocaine (LIDODERM) 5 % Place 1 patch onto the skin daily.  06/06/15   [provider]  Melatonin 3 MG TABS Take 6 mg by mouth at bedtime.  06/28/15   [provider]  metoprolol succinate (TOPROL-XL) 50 MG 24 hr tablet TAKE ONE TABLET BY MOUTH EVERY DAY WITH OR IMMEDIATELY FOLLOWING A MEAL. Patient not taking: Reported on 04/01/2017 09/22/14   Tammi Sou, MD  nitroGLYCERIN (NITROSTAT) 0.4 MG SL  tablet Place 1 tablet (0.4 mg total) under the tongue every 5 (five) minutes as needed for chest pain. 07/25/14   Kerrie Buffalo, NP  OLANZapine zydis (ZYPREXA) 5 MG disintegrating tablet Take 1 tablet (5 mg total) by mouth at bedtime. Patient not taking: Reported on 04/01/2017 06/19/15   Samuella Cota, MD  omeprazole (PRILOSEC) 20 MG capsule TAKE (1) CAPSULE BY MOUTH TWICE DAILY. 09/22/14   McGowen, Adrian Blackwater, MD  traZODone (DESYREL) 50 MG tablet Take 50 mg by mouth at bedtime.  06/28/15 07/28/15  [provider]    Family History Family History  Problem Relation Age of Onset  . Heart attack Sister   . Cancer Sister   . Asthma Sister   . Cancer Mother   . Diabetes Brother   . Heart attack Brother   . Asthma Brother   . Heart disease Brother     Social History Social History  Substance Use Topics  . Smoking status: Current Every Day Smoker    Packs/day: 1.50    Types: Cigarettes  . Smokeless tobacco: Never Used     Comment: Using E-cigarette also.  Marland Kitchen Alcohol use No     Allergies   Dust mite extract; Januvia [sitagliptin phosphate]; Latex; Metformin; Metoclopramide hcl; Penicillins; and Sulfonamide derivatives   Review of Systems Review of Systems  Constitutional: Positive for activity change.  Respiratory: Negative for shortness of breath.   Cardiovascular: Negative for chest pain.  Gastrointestinal: Positive for abdominal pain and diarrhea.  Genitourinary: Negative for dysuria.  Musculoskeletal: Positive for back pain.     Physical Exam Updated Vital Signs BP (!) 120/57   Pulse 99   Temp 97.7 F (36.5 C) (Oral)   Resp 18   Ht 5\' 5"  (1.651 m)   Wt 77.1 kg (170 lb)   SpO2 96%   BMI 28.29 kg/m   Physical Exam  Constitutional: She is oriented to person, place, and time. She appears well-developed.  HENT:  Head: Normocephalic and atraumatic.  Eyes: EOM are normal.  Neck: Normal range of motion. Neck supple.  Cardiovascular: Normal rate and  intact distal pulses.   Distal radial and femoral pulse are equal  Pulmonary/Chest: Effort normal.  Abdominal: Soft. Bowel sounds are normal. She exhibits no mass. There is tenderness. There is guarding. There is no rebound. No hernia.  Neurological: She is alert and oriented to person, place, and time.  Skin: Skin is warm and dry.  Nursing note and vitals reviewed.    ED Treatments / Results  Labs (all labs ordered are listed, but only abnormal results are displayed) Labs Reviewed  CBC WITH DIFFERENTIAL/PLATELET - Abnormal; Notable for the following:       Result Value   WBC 27.7 (*)    RBC 5.56 (*)    Hemoglobin 16.3 (*)    HCT 48.2 (*)    Neutro Abs 24.4 (*)    Monocytes Absolute 1.4 (*)    All other components within normal limits  COMPREHENSIVE METABOLIC PANEL - Abnormal; Notable for the following:    Sodium 131 (*)    Potassium 5.2 (*)    Chloride 98 (*)    Glucose, Bld 159 (*)    Creatinine, Ser 2.28 (*)    Calcium 8.5 (*)    Total Protein 6.1 (*)    Albumin 3.0 (*)    ALT 11 (*)    GFR calc non Af Amer 21 (*)    GFR calc Af Amer 24 (*)    All other components within normal limits  I-STAT CG4 LACTIC ACID, ED - Abnormal; Notable for the following:    Lactic Acid, Venous 2.15 (*)    All other components within normal limits  POC OCCULT BLOOD, ED - Abnormal; Notable for the following:    Fecal Occult Bld POSITIVE (*)    All other components within normal limits  CULTURE, BLOOD (ROUTINE X 2)  CULTURE, BLOOD (ROUTINE X 2)  URINE CULTURE  LIPASE, BLOOD  URINALYSIS, ROUTINE W REFLEX MICROSCOPIC  I-STAT TROPONIN, ED  POC OCCULT BLOOD, ED    EKG  EKG Interpretation  Date/Time:  Tuesday April 01 2017 07:19:58 EDT Ventricular Rate:  100 PR Interval:    QRS Duration: 86 QT Interval:  346 QTC Calculation: 447 R Axis:   73 Text Interpretation:  Sinus tachycardia Multiple ventricular premature complexes No acute changes No significant change since last tracing  Confirmed by Varney Biles [id number]) on 04/01/2017 7:50:35 AM       Radiology Ct Abdomen Pelvis Wo Contrast  Result Date: 04/01/2017 CLINICAL DATA:  Lower abdominal pain, back pain EXAM: CT ABDOMEN AND PELVIS WITHOUT CONTRAST TECHNIQUE: Multidetector CT imaging of the abdomen and pelvis was performed following the standard protocol without IV contrast. COMPARISON:  06/16/2015 FINDINGS: Lower chest: Lung bases are clear. No effusions. Heart is normal size. Hepatobiliary: Prior cholecystectomy. No focal hepatic abnormality. Common bile duct dilatation to 16 mm, likely related to post cholecystectomy state, unchanged since prior study. Pancreas: No focal abnormality or ductal dilatation. Spleen: No focal abnormality.  Normal size. Adrenals/Urinary Tract: No adrenal abnormality. No focal renal abnormality. No stones or hydronephrosis. Urinary bladder is unremarkable. Stomach/Bowel: Few scattered sigmoid diverticula. No active diverticulitis. Stomach, large and small bowel grossly unremarkable. Vascular/Lymphatic: Diffuse aortic and iliac calcifications. No aneurysm or adenopathy. Reproductive: Prior hysterectomy.  No adnexal masses. Other: No free fluid or free air. Musculoskeletal: No acute bony abnormality. IMPRESSION: No acute findings in the abdomen or pelvis. Stable biliary ductal dilatation following cholecystectomy. Aortoiliac atherosclerosis. Sigmoid diverticulosis.  No active diverticulitis. Electronically Signed   By: Rolm Baptise M.D.   On: 04/01/2017 09:45    Procedures Procedures (including critical care time)  CRITICAL CARE Performed by: Varney Biles   Total critical care time: 52 minutes severe sepsis with AKI vs. Mesenteric ischemia  Critical care time was exclusive of separately billable procedures and treating other patients.  Critical care was necessary to treat or prevent imminent or life-threatening deterioration.  Critical care was time spent personally by me on the  following activities: development of treatment plan with patient and/or surrogate as well as nursing, discussions with consultants, evaluation of patient's response to treatment, examination of patient, obtaining history from patient or surrogate, ordering and performing treatments and interventions, ordering and review of laboratory studies, ordering and review of radiographic studies, pulse oximetry and re-evaluation of patient's condition.   Medications Ordered in ED Medications  sodium chloride 0.9 % bolus 1,000 mL (1,000 mLs Intravenous New Bag/Given 04/01/17 0955)  metroNIDAZOLE (FLAGYL) IVPB 500 mg (not administered)  sodium chloride 0.9 % bolus 1,000 mL (0 mLs Intravenous Stopped 04/01/17 0954)  morphine 4 MG/ML injection 8 mg (8 mg Intravenous Given 04/01/17 0752)  levofloxacin (LEVAQUIN) IVPB 750 mg (750 mg Intravenous New Bag/Given 04/01/17 0900)  metroNIDAZOLE (FLAGYL) IVPB 500 mg (0 mg Intravenous Stopped 04/01/17 1000)     Initial Impression / Assessment and Plan / ED Course  I have reviewed the triage vital signs and the nursing notes.  Pertinent labs & imaging results that were available during my care of the patient were reviewed by me and considered in my medical decision making (see chart for details).  Clinical Course as of Apr 02 1035  Tue Apr 01, 2017  0932 AKI noted. CT changed to non contrasted imaging. BUN is normal, so unlikely to be pre-renal. Creatinine: (!) 2.28 [AN]  0832 CT pending WBC: (!) 27.7 [AN]  1029 DRE showed grossly BRBPR. PT has diverticulosis, so this could be diverticular bleed. However, with pain out of proportion, elevated WC, extensive smoking hx - mesenteric ischemia remains in the  ddx. Spoke with Dr. Oneida Alar, GI - who recommends that pt should be admitted to [facility] if there is any concern of ischemia and the hospitalist concurs after his evaluation. Pt is stable at this time. Repeat vascular exam is still the same and I did a complete neuro exam and pt  has LLE numbness, but otherwise no focal deficits.   [AN]  1031 No acute findings. CT Abdomen Pelvis Wo Contrast [AN]  56 Dr. Wallis Mart with GI aware. Requests Hospitalist to let him know when patient arrives.  [AN]    Clinical Course User Index [AN] Varney Biles, MD    Pt comes in with cc of abd pain. Pt is noted to have generalized abd tenderness, with guarding mostly over the lower quadrants. Pt has hx of extensive smoking and diverticulosis. No recent advanced imaging. DDX:  Diverticulitis AAA Mesenteric ischemia (lactate ordered, low suspocion, pt is non toxic appearing).  We will get DRE as well.  Final Clinical Impressions(s) / ED Diagnoses   Final diagnoses:  AKI (acute kidney injury) ([place])  Acute lower GI bleeding  Mesenteric ischemia [facility])    New Prescriptions New Prescriptions   No medications on file     Varney Biles, MD 04/01/17 1036
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
## 2017-04-01 NOTE — ED Notes (Signed)
Paged GI to Dr Kathrynn Humble @ [phone]

## 2017-04-02 ENCOUNTER — Encounter (HOSPITAL_COMMUNITY)
Admission: EM | Disposition: A | Discharge status: Home / Self Care | Payer: Self-pay | Source: Home / Self Care | Attending: Nephrology

## 2017-04-02 ENCOUNTER — Inpatient Hospital Stay (HOSPITAL_COMMUNITY): Payer: Medicare Other | Admitting: Anesthesiology

## 2017-04-02 ENCOUNTER — Encounter (HOSPITAL_COMMUNITY): Payer: Self-pay

## 2017-04-02 DIAGNOSIS — K559 Vascular disorder of intestine, unspecified: Principal | ICD-10-CM

## 2017-04-02 HISTORY — PX: COLONOSCOPY WITH PROPOFOL: SHX5780

## 2017-04-02 LAB — CBC WITH DIFFERENTIAL/PLATELET
BASOS ABS: 0 10*3/uL (ref 0.0–0.1)
BASOS PCT: 0 %
Basophils Absolute: 0 10*3/uL (ref 0.0–0.1)
Basophils Absolute: 0 10*3/uL (ref 0.0–0.1)
Basophils Relative: 0 %
Basophils Relative: 0 %
EOS ABS: 0.2 10*3/uL (ref 0.0–0.7)
EOS PCT: 1 %
EOS PCT: 1 %
EOS PCT: 1 %
Eosinophils Absolute: 0.1 10*3/uL (ref 0.0–0.7)
Eosinophils Absolute: 0.2 10*3/uL (ref 0.0–0.7)
HCT: 38.3 % (ref 36.0–46.0)
HCT: 41 % (ref 36.0–46.0)
HCT: 36.5 % (ref 36.0–46.0)
HEMOGLOBIN: 12.6 g/dL (ref 12.0–15.0)
Hemoglobin: 13.2 g/dL (ref 12.0–15.0)
Hemoglobin: 11.9 g/dL — ABNORMAL LOW (ref 12.0–15.0)
LYMPHS ABS: 2.7 10*3/uL (ref 0.7–4.0)
LYMPHS ABS: 3 10*3/uL (ref 0.7–4.0)
LYMPHS PCT: 17 %
LYMPHS PCT: 18 %
Lymphocytes Relative: 16 %
Lymphs Abs: 3.1 10*3/uL (ref 0.7–4.0)
MCH: 28.3 pg (ref 26.0–34.0)
MCH: 28 pg (ref 26.0–34.0)
MCH: 28.7 pg (ref 26.0–34.0)
MCHC: 32.9 g/dL (ref 30.0–36.0)
MCHC: 32.2 g/dL (ref 30.0–36.0)
MCHC: 32.6 g/dL (ref 30.0–36.0)
MCV: 86.1 fL (ref 78.0–100.0)
MCV: 87 fL (ref 78.0–100.0)
MCV: 88 fL (ref 78.0–100.0)
MONO ABS: 1 10*3/uL (ref 0.1–1.0)
MONO ABS: 0.8 10*3/uL (ref 0.1–1.0)
Monocytes Absolute: 0.7 10*3/uL (ref 0.1–1.0)
Monocytes Relative: 4 %
Monocytes Relative: 5 %
Monocytes Relative: 5 %
Neutro Abs: 12.7 10*3/uL — ABNORMAL HIGH (ref 1.7–7.7)
Neutro Abs: 15.4 10*3/uL — ABNORMAL HIGH (ref 1.7–7.7)
Neutro Abs: 12.4 10*3/uL — ABNORMAL HIGH (ref 1.7–7.7)
Neutrophils Relative %: 78 %
Neutrophils Relative %: 78 %
Neutrophils Relative %: 76 %
PLATELETS: 303 10*3/uL (ref 150–400)
PLATELETS: 316 10*3/uL (ref 150–400)
PLATELETS: 270 10*3/uL (ref 150–400)
RBC: 4.45 MIL/uL (ref 3.87–5.11)
RBC: 4.71 MIL/uL (ref 3.87–5.11)
RBC: 4.15 MIL/uL (ref 3.87–5.11)
RDW: 14.2 % (ref 11.5–15.5)
RDW: 14.3 % (ref 11.5–15.5)
RDW: 14.7 % (ref 11.5–15.5)
WBC: 16.2 10*3/uL — AB (ref 4.0–10.5)
WBC: 19.7 10*3/uL — ABNORMAL HIGH (ref 4.0–10.5)
WBC: 16.3 10*3/uL — ABNORMAL HIGH (ref 4.0–10.5)

## 2017-04-02 LAB — BASIC METABOLIC PANEL
Anion gap: 5 (ref 5–15)
BUN: 6 mg/dL (ref 6–20)
CO2: 23 mmol/L (ref 22–32)
CREATININE: 0.81 mg/dL (ref 0.44–1.00)
Calcium: 8.2 mg/dL — ABNORMAL LOW (ref 8.9–10.3)
Chloride: 105 mmol/L (ref 101–111)
GFR calc Af Amer: >60 mL/min (ref >60)
GLUCOSE: 98 mg/dL (ref 65–99)
POTASSIUM: 4.1 mmol/L (ref 3.5–5.1)
Sodium: 133 mmol/L — ABNORMAL LOW (ref 135–145)

## 2017-04-02 SURGERY — COLONOSCOPY WITH PROPOFOL
Anesthesia: Monitor Anesthesia Care

## 2017-04-02 MED ORDER — HYDROMORPHONE HCL 1 MG/ML IJ SOLN
0.5000 mg | INTRAMUSCULAR | Status: DC | PRN
  Administered 2017-04-02 – 2017-04-03 (×6): 1 mg via INTRAVENOUS
  Filled 2017-04-02 (×7): qty 1

## 2017-04-02 MED ORDER — SODIUM CHLORIDE 0.9% FLUSH
3.0000 mL | Freq: Two times a day (BID) | INTRAVENOUS | Status: DC
  Administered 2017-04-02 – 2017-04-03 (×3): 3 mL via INTRAVENOUS

## 2017-04-02 MED ORDER — PHENYLEPHRINE HCL 10 MG/ML IJ SOLN
INTRAMUSCULAR | Status: DC | PRN
  Administered 2017-04-02: 40 ug via INTRAVENOUS

## 2017-04-02 MED ORDER — LIDOCAINE 2% (20 MG/ML) 5 ML SYRINGE
INTRAMUSCULAR | Status: DC | PRN
  Administered 2017-04-02: 50 mg via INTRAVENOUS

## 2017-04-02 MED ORDER — LACTATED RINGERS IV SOLN
INTRAVENOUS | Status: DC | PRN
  Administered 2017-04-02: 14:00:00 via INTRAVENOUS

## 2017-04-02 MED ORDER — LEVOFLOXACIN IN D5W 500 MG/100ML IV SOLN
500.0000 mg | INTRAVENOUS | Status: DC
  Administered 2017-04-02: 500 mg via INTRAVENOUS
  Filled 2017-04-02: qty 100

## 2017-04-02 MED ORDER — PROMETHAZINE HCL 25 MG/ML IJ SOLN
12.5000 mg | Freq: Four times a day (QID) | INTRAMUSCULAR | Status: DC | PRN
  Administered 2017-04-02: 12.5 mg via INTRAVENOUS
  Filled 2017-04-02: qty 1

## 2017-04-02 MED ORDER — PROPOFOL 10 MG/ML IV BOLUS
INTRAVENOUS | Status: DC | PRN
  Administered 2017-04-02 (×4): 40 mg via INTRAVENOUS
  Administered 2017-04-02: 20 mg via INTRAVENOUS
  Administered 2017-04-02: 50 mg via INTRAVENOUS

## 2017-04-02 MED ORDER — SODIUM CHLORIDE 0.9% FLUSH: 3.0000 mL | INTRAVENOUS | Status: DC | PRN

## 2017-04-02 SURGICAL SUPPLY — 22 items

## 2017-04-02 NOTE — Progress Notes (Signed)
Patient's colonoscopy showed changes consistent with ischemic colitis in the distal transverse colon (the classic "watershed" area), and in the sigmoid region. The colitis is mild to moderate in severity, and appears to be resolving. There was no blood whatsoever in the colonic lumen.  Recommendations:  1. Patient still has leukocytosis, but no fever. Based on the colonoscopic findings, I see no mandate to continue antibiotic therapy, so would consider stopping Levaquin and Flagyl.  2. Will allow clear liquid diet; couldn't advance to solid food tomorrow if doing okay.  3. Depending on patient's clinical evolution, discharge tomorrow might be possible. However, this patient has a lot of chronic an underlying symptoms, including chronic IBS, chronic back pain, chronic abdominal pain, and so forth, so it may be difficult to sort out what is residual symptomatology from this acute illness, versus her chronic underlying symptoms.  Cleotis Nipper, M.D. Pager [phone] If no answer or after 5 PM call [phone]

## 2017-04-02 NOTE — Progress Notes (Signed)
Pharmacy Antibiotic Note  [name] is a 71 y.o. female admitted on 04/01/2017 with possible ischemic colitis.  Pharmacy has been consulted for Levaquin and Flagyl dosing.  Continues on abx for possible ischemic colitis. Colonoscopy planned for 8/1. Afebrile, WBC elevated at 16.2. SCr improved drastically.  Plan: Increase Levaquin 500mg  IV to Q24h Continue Flagyl 500mg  IV Q8h Monitor clinical picture, renal function F/U C&S, abx deescalation / LOT F/U colonoscopy today  Height: 5\' 5"  (165.1 cm) Weight: 170 lb (77.1 kg) IBW/kg (Calculated) : 57  Temp (24hrs), Avg:98.6 F (37 C), Min:98.1 F (36.7 C), Max:98.9 F (37.2 C)   Recent Labs Lab 04/01/17 0735 04/01/17 0750 04/01/17 1206 04/01/17 1946 04/02/17 0224  WBC 27.7*  --   --  18.4* 16.2*  CREATININE 2.28*  --   --   --  0.81  LATICACIDVEN  --  2.15* 1.15  --   --     Estimated Creatinine Clearance: 68.2 mL/min (by C-G formula based on SCr of 0.81 mg/dL).    Allergies  Allergen Reactions  . Dust Mite Extract   . Januvia [Sitagliptin Phosphate] Nausea Only  . Latex   . Metformin Nausea And Vomiting    dizziness  . Metoclopramide Hcl     Unknown   . Penicillins     Has patient had a PCN reaction causing immediate rash, facial/tongue/throat swelling, SOB or lightheadedness with hypotension: Yes Has patient had a PCN reaction causing severe rash involving mucus membranes or skin necrosis: No Has patient had a PCN reaction that required hospitalization No Has patient had a PCN reaction occurring within the last 10 years: Yes If all of the above answers are "NO", then may proceed with Cephalosporin use.   . Sulfonamide Derivatives     REACTION: Reaction not known    Thank you for allowing pharmacy to be a part of this patient's care.  Reginia Naas 04/02/2017 9:32 AM

## 2017-04-02 NOTE — Anesthesia Preprocedure Evaluation (Signed)
Anesthesia Evaluation  Patient identified by MRN, date of birth, ID band Patient awake    Reviewed: Allergy & Precautions, NPO status , Patient's Chart, lab work & pertinent test results, reviewed documented beta blocker date and time   History of Anesthesia Complications Negative for: history of anesthetic complications  Airway Mallampati: II  TM Distance: >3 FB Neck ROM: Full    Dental  (+) Edentulous Upper, Edentulous Lower   Pulmonary COPD,  COPD inhaler and oxygen dependent, Current Smoker,    breath sounds clear to auscultation       Cardiovascular hypertension, Pt. on medications and Pt. on home beta blockers (-) angina Rhythm:Regular Rate:Normal  '13 stress test: normal   Neuro/Psych Seizures -,  chronic back pain    GI/Hepatic Neg liver ROS, GERD  ,  Endo/Other  diabetes (no longer requires meds)  Renal/GU ARFRenal disease     Musculoskeletal   Abdominal   Peds  Hematology negative hematology ROS (+)   Anesthesia Other Findings   Reproductive/Obstetrics                             Anesthesia Physical Anesthesia Plan  ASA: III  Anesthesia Plan: MAC   Post-op Pain Management:    Induction:   PONV Risk Score and Plan: 1 and Ondansetron  Airway Management Planned: Natural Airway and Nasal Cannula  Additional Equipment:   Intra-op Plan:   Post-operative Plan:   Informed Consent: I have reviewed the patients History and Physical, chart, labs and discussed the procedure including the risks, benefits and alternatives for the proposed anesthesia with the patient or authorized representative who has indicated his/her understanding and acceptance.     Plan Discussed with: CRNA and Surgeon  Anesthesia Plan Comments: (Plan routine monitors, MAC)        Anesthesia Quick Evaluation

## 2017-04-02 NOTE — Op Note (Signed)
[facility] Patient Name: [name] Procedure Date : 04/02/2017 MRN: [id number] Attending MD: Ronald Lobo , MD Date of Birth: 12/26/1945 CSN: [id number] Age: 71 Admit Type: Inpatient Procedure:                Colonoscopy Indications:              Generalized abdominal pain, Hematochezia with                            diarrhea in a patient with chronic IBS, multiple                            risk factors for ischemia Providers:                Ronald Lobo, MD, Cleda Daub, RN, Corliss Parish, Technician Referring MD:              Medicines:                Monitored Anesthesia Care Complications:            No immediate complications. Estimated Blood Loss:     Estimated blood loss was minimal. Procedure:                Pre-Anesthesia Assessment:                           - Prior to the procedure, a History and Physical                            was performed, and patient medications and                            allergies were reviewed. The patient's tolerance of                            previous anesthesia was also reviewed. The risks                            and benefits of the procedure and the sedation                            options and risks were discussed with the patient.                            All questions were answered, and informed consent                            was obtained. Prior Anticoagulants: The patient has                            taken aspirin, last dose was 2 days prior to  procedure. ASA Grade Assessment: III - A patient                            with severe systemic disease. After reviewing the                            risks and benefits, the patient was deemed in                            satisfactory condition to undergo the procedure.                           After obtaining informed consent, the colonoscope                            was passed under  direct vision. Throughout the                            procedure, the patient's blood pressure, pulse, and                            oxygen saturations were monitored continuously. The                            [id number]) scope was introduced through                            the anus and advanced to the the cecum, identified                            by transillumination, absence of further lumen,                            typical capacious cecal appearance, and what                            appeared to be the ileocecal valve, although                            landmarks were obscured by liquid stool. The                            colonoscopy was somewhat difficult due to poor                            bowel prep and significant looping. Successful                            completion of the procedure was aided by using                            manual pressure. The patient tolerated the  procedure well. The quality of the bowel                            preparation was adequate (this procedure was done                            unprepped to look for the distribution of blood                            within the colon, and in view of the fact she had                            been having diarrhea). The rectum was photographed. Scope In: 2:15:05 PM Scope Out: 2:40:30 PM Scope Withdrawal Time: 0 hours 14 minutes 55 seconds  Total Procedure Duration: 0 hours 25 minutes 25 seconds  Findings:      A scattered area of moderately congested, inflamed and ulcerated mucosa       was found in the distal transverse colon. Biopsies were taken with a       cold forceps for histology. Estimated blood loss was minimal.      A segmental area of mildly congested, erythematous and granular mucosa       was found in the distal sigmoid colon. Biopsies were taken with a cold       forceps for histology. Estimated blood loss was minimal.      Multiple  medium-mouthed diverticula were found in the sigmoid colon.      A 4 mm polyp was found in the rectum (benign-appearing lesion). The       polyp was sessile. The polyp was removed with a cold snare. Resection       and retrieval were complete. Estimated blood loss was minimal.      The exam was otherwise normal throughout the examined colon. No blood       within the colonic lumen at this time.      The retroflexed view of the distal rectum and anal verge was normal and       showed no anal or rectal abnormalities. Impression:               - The overall picture is most consistent with                            mild-to-moderate, resolving ischemic colitis.                           - Congested, inflamed and ulcerated mucosa in the                            distal transverse colon. Biopsied.                           - Congested, erythematous and granular mucosa in                            the distal sigmoid colon. Biopsied.                           -  Diverticulosis in the sigmoid colon.                           - One benign appearing 4 mm polyp in the rectum,                            removed with a cold snare. Resected and retrieved.                           - The distal rectum and anal verge are normal on                            retroflexion view. Moderate Sedation:      This patient was sedated with monitored anesthesia care, not moderate       sedation. Recommendation:           - Await pathology results.                           - Clear liquid diet today.                           - Continue present medications.                           - Repeat colonoscopy date will be determined after                            pathology results from today's exam become                            available for review. Procedure Code(s):        --- Professional ---                           [phone], Colonoscopy, flexible; with removal of                            tumor(s), polyp(s), or  other lesion(s) by snare                            technique                           [id number], 59, Colonoscopy, flexible; with biopsy,                            single or multiple Diagnosis Code(s):        --- Professional ---                           K63.3, Ulcer of intestine                           K62.1, Rectal polyp  K92.1, Melena (includes Hematochezia)                           R10.84, Generalized abdominal pain CPT copyright 2016 American Medical Association. All rights reserved. The codes documented in this report are preliminary and upon coder review may  be revised to meet current compliance requirements. Ronald Lobo, MD 04/02/2017 2:58:27 PM This report has been signed electronically. Number of Addenda: 0

## 2017-04-02 NOTE — Interval H&P Note (Signed)
History and Physical Interval Note:  04/02/2017 1:55 PM  [name]  has presented today for surgery, with the diagnosis of Rectal bleeding  The various methods of treatment have been discussed with the patient and family. After consideration of risks, benefits and other options for treatment, the patient has consented to  Procedure(s) with comments: COLONOSCOPY WITH PROPOFOL (N/A) - The exam will be done unprepped. Ultraslim colonoscope, please. as a surgical intervention .  The patient's history has been reviewed, patient examined, no change in status, stable for surgery.  I have reviewed the patient's chart and labs.  Questions were answered to the patient's satisfaction.     Cleotis Nipper
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
## 2017-04-02 NOTE — Progress Notes (Signed)
[facility] Progress Note  Subjective: had diarrheal stool this am, not bloody.  Worried about recurrent skin eruption, legs/ back/ lower lip. "Nobody ever found out what it is".    Vitals:   04/01/17 1500 04/01/17 1624 04/01/17 2116 04/02/17 0617  BP: (!) 114/59 (!) 126/57 (!) 120/51 (!) 116/44  Pulse: 98 97 93 64  Resp: 14 16 16 16   Temp:  98.1 F (36.7 C) 98.9 F (37.2 C) 98.7 F (37.1 C)  TempSrc:  Axillary Oral Oral  SpO2: 93% 98% 97% 94%  Weight:      Height:        Inpatient medications: . amLODipine  10 mg Oral Daily  . benztropine  0.5 mg Oral BID  . carvedilol  12.5 mg Oral BID WC  . DULoxetine  60 mg Oral Daily  . fluticasone  1 application Topical BID  . lamoTRIgine  25 mg Oral BID  . lidocaine  1 patch Transdermal Q24H  . loratadine  10 mg Oral Daily  . omega-3 acid ethyl esters  2 g Oral Daily  . pantoprazole (PROTONIX) IV  40 mg Intravenous Q24H  . pravastatin  80 mg Oral Daily  . pregabalin  75 mg Oral BID   . levofloxacin (LEVAQUIN) IV    . metronidazole Stopped (04/02/17 0227)   albuterol, HYDROmorphone (DILAUDID) injection, nitroGLYCERIN  Exam: Alert, no distress, calm No jvd Chest dec'd BS throughout, some mild crackles bilat bases, no wheezing RRR no mrg ABd soft, mild tenderness mid abd, +BS Ext skin lesions scattered about LE's and back/ lower lip, small 1-24mm w central eschar/ surrounding erythema, no blistering or drainage Neuro NF, anxious  Home meds > albuterol, norvasc, asa, lotensin, cogentin, coreg, cymbalta, estrace, allegra, lamictal, lyrica, pravachol, klonopin, amaryl, toprol-XL, NTG SL, Zyprexa, prilosec, desyrel       Impression: 1. Abd pain / bloody diarrhea - suspected colitis, per GI possible ischemic colitis. On IV abx levaquin and flagyl, WBC 27K > 16k today. Abd benign today.  For unprepped colonoscopy today.  Cont abx.  2. AKI - resolving w/ IVF's alone 3. Chronic back pain - polypharmacy 4. HTN -  holding ACEi. BP's somewhat low, dc coreg for now.  5. Tobacco use 6. Skin eruption - not sure impetigo? 7. DVT proph - SCD's  Plan - as above   Kelly Splinter MD [facility] pager [phone]   04/02/2017, 10:16 AM    Recent Labs Lab 04/01/17 0735 04/02/17 0224  NA 131* 133*  K 5.2* 4.1  CL 98* 105  CO2 22 23  GLUCOSE 159* 98  BUN 20 6  CREATININE 2.28* 0.81  CALCIUM 8.5* 8.2*    Recent Labs Lab 04/01/17 0735  AST 19  ALT 11*  ALKPHOS 48  BILITOT 0.4  PROT 6.1*  ALBUMIN 3.0*    Recent Labs Lab 04/01/17 0735 04/01/17 1946 04/02/17 0224  WBC 27.7* 18.4* 16.2*  NEUTROABS 24.4* 13.9* 12.7*  HGB 16.3* 13.9 12.6  HCT 48.2* 41.8 38.3  MCV 86.7 85.7 86.1  PLT 382 333 303   Iron/TIBC/Ferritin/ %Sat    Component Value Date/Time   IRON 53 05/30/2009 1437   IRONPCTSAT 12.3 (L) 05/30/2009 1437

## 2017-04-02 NOTE — Progress Notes (Signed)
Patient had complained of "extreme pain" in her right knee radiating to her right foot.  Has multiple complaints, including old scabs from skin eruptions on her face/lips, upper back, and upper legs.  Dilaudid given as requested and ordered at 1134.  Patient in sound sleep after 15 minutes, but stated it "didn't help at all".  Complained of nausea / phenergan given.

## 2017-04-02 NOTE — Transfer of Care (Signed)
Immediate Anesthesia Transfer of Care Note  Patient: [name]  Procedure(s) Performed: Procedure(s) with comments: COLONOSCOPY WITH PROPOFOL (N/A) - The exam will be done unprepped. Ultraslim colonoscope, please.  Patient Location: PACU and Endoscopy Unit  Anesthesia Type:MAC  Level of Consciousness: awake, alert  and patient cooperative  Airway & Oxygen Therapy: Patient Spontanous Breathing and Patient connected to nasal cannula oxygen  Post-op Assessment: Report given to RN, Post -op Vital signs reviewed and stable and Patient moving all extremities  Post vital signs: Reviewed and stable  Last Vitals:  Vitals:   04/02/17 0617 04/02/17 1327  BP: (!) 116/44 (!) 110/45  Pulse: 64 80  Resp: 16 14  Temp: 37.1 C 37 C    Last Pain:  Vitals:   04/02/17 1327  TempSrc: Oral  PainSc:       Patients Stated Pain Goal: 1 (75/10/25 8527)  Complications: No apparent anesthesia complications

## 2017-04-02 NOTE — Anesthesia Postprocedure Evaluation (Signed)
Anesthesia Post Note  Patient: [name]  Procedure(s) Performed: Procedure(s) (LRB): COLONOSCOPY WITH PROPOFOL (N/A)     Patient location during evaluation: Endoscopy Anesthesia Type: MAC Level of consciousness: awake and alert, patient cooperative and oriented Pain management: pain level controlled Vital Signs Assessment: post-procedure vital signs reviewed and stable Respiratory status: spontaneous breathing, nonlabored ventilation, respiratory function stable and patient connected to nasal cannula oxygen Cardiovascular status: blood pressure returned to baseline and stable Postop Assessment: no signs of nausea or vomiting Anesthetic complications: no    Last Vitals:  Vitals:   04/02/17 1500 04/02/17 1510  BP: (!) 95/33 (!) 110/44  Pulse: 90 86  Resp: 18 13  Temp:      Last Pain:  Vitals:   04/02/17 1450  TempSrc: Oral  PainSc:                  Janin Kozlowski,[employer]

## 2017-04-03 DIAGNOSIS — K922 Gastrointestinal hemorrhage, unspecified: Secondary | ICD-10-CM

## 2017-04-03 DIAGNOSIS — R197 Diarrhea, unspecified: Secondary | ICD-10-CM

## 2017-04-03 DIAGNOSIS — N179 Acute kidney failure, unspecified: Secondary | ICD-10-CM

## 2017-04-03 DIAGNOSIS — F17200 Nicotine dependence, unspecified, uncomplicated: Secondary | ICD-10-CM

## 2017-04-03 DIAGNOSIS — K588 Other irritable bowel syndrome: Secondary | ICD-10-CM

## 2017-04-03 DIAGNOSIS — G2581 Restless legs syndrome: Secondary | ICD-10-CM

## 2017-04-03 DIAGNOSIS — G894 Chronic pain syndrome: Secondary | ICD-10-CM

## 2017-04-03 DIAGNOSIS — I10 Essential (primary) hypertension: Secondary | ICD-10-CM

## 2017-04-03 LAB — CBC WITH DIFFERENTIAL/PLATELET
BASOS ABS: 0 10*3/uL (ref 0.0–0.1)
BASOS PCT: 0 %
EOS ABS: 0.2 10*3/uL (ref 0.0–0.7)
EOS PCT: 2 %
HCT: 37.4 % (ref 36.0–46.0)
Hemoglobin: 12.2 g/dL (ref 12.0–15.0)
Lymphocytes Relative: 20 %
Lymphs Abs: 2.6 10*3/uL (ref 0.7–4.0)
MCH: 28.2 pg (ref 26.0–34.0)
MCHC: 32.6 g/dL (ref 30.0–36.0)
MCV: 86.4 fL (ref 78.0–100.0)
MONO ABS: 0.7 10*3/uL (ref 0.1–1.0)
Monocytes Relative: 6 %
Neutro Abs: 9.3 10*3/uL — ABNORMAL HIGH (ref 1.7–7.7)
Neutrophils Relative %: 72 %
PLATELETS: 260 10*3/uL (ref 150–400)
RBC: 4.33 MIL/uL (ref 3.87–5.11)
RDW: 14 % (ref 11.5–15.5)
WBC: 12.8 10*3/uL — AB (ref 4.0–10.5)

## 2017-04-03 LAB — BASIC METABOLIC PANEL
Anion gap: 7 (ref 5–15)
BUN: <5 mg/dL — ABNORMAL LOW (ref 6–20)
CALCIUM: 8.1 mg/dL — AB (ref 8.9–10.3)
CO2: 26 mmol/L (ref 22–32)
CREATININE: 0.56 mg/dL (ref 0.44–1.00)
Chloride: 105 mmol/L (ref 101–111)
GFR calc non Af Amer: >60 mL/min (ref >60)
GLUCOSE: 94 mg/dL (ref 65–99)
Potassium: 3.4 mmol/L — ABNORMAL LOW (ref 3.5–5.1)
Sodium: 138 mmol/L (ref 135–145)

## 2017-04-03 LAB — CBC
HCT: 34.3 % — ABNORMAL LOW (ref 36.0–46.0)
Hemoglobin: 11 g/dL — ABNORMAL LOW (ref 12.0–15.0)
MCH: 27.6 pg (ref 26.0–34.0)
MCHC: 32.1 g/dL (ref 30.0–36.0)
MCV: 86.2 fL (ref 78.0–100.0)
PLATELETS: 263 10*3/uL (ref 150–400)
RBC: 3.98 MIL/uL (ref 3.87–5.11)
RDW: 14 % (ref 11.5–15.5)
WBC: 12.4 10*3/uL — ABNORMAL HIGH (ref 4.0–10.5)

## 2017-04-03 LAB — URINE CULTURE: CULTURE: NO GROWTH

## 2017-04-03 MED ORDER — BENAZEPRIL HCL 20 MG PO TABS: 20.0000 mg | ORAL_TABLET | Freq: Every day | ORAL | Status: AC

## 2017-04-03 MED ORDER — PHENOL 1.4 % MT LIQD: 1.0000 | OROMUCOSAL | Status: DC | PRN

## 2017-04-03 MED ORDER — PHENOL 1.4 % MT LIQD: 1.0000 | OROMUCOSAL | 1 refills | Status: AC | PRN

## 2017-04-03 MED ORDER — CARVEDILOL 12.5 MG PO TABS: 12.5000 mg | ORAL_TABLET | Freq: Two times a day (BID) | ORAL | Status: AC

## 2017-04-03 NOTE — Discharge Instructions (Addendum)
We are making you an appt with Dr Cristina Gong , the GI doctor , for f/u of your ischemic colitis.  Ischemic colilitis is a condition where the lining of the large intestines gets damaged, in this case mild damage w/ pain and bleeding, due to poor blood flow/ arterial blockages in the vessels that feed the intestine.  There is no medication for this, it usually will just run its course. DR Buccinni advised that you could f/u with him in the office.  See elsewhere for details f/u visit.  Resume your medications as in the AVS sheet.

## 2017-04-03 NOTE — Care Management Note (Signed)
Case Management Note  Patient Details  Name: [name] MRN: [id number] Date of Birth: 09-May-1946  Subjective/Objective:                    Action/Plan:  Patient has no transportation home to [place]. SW unable to provide cab voucher.   Spoke with patient confirmed address. Patient states she has no way home , has friends be none drive.Patient aware she will receive bill for ambulance transportation home and is agreeable for same. [facility] called and transportation arranged. Bedside nurse aware. Expected Discharge Date:  04/03/17               Expected Discharge Plan:  Home/Self Care  In-House Referral:     Discharge planning Services  CM Consult  Post Acute Care Choice:    Choice offered to:  Patient  DME Arranged:    DME Agency:     HH Arranged:    [place] Agency:     Status of Service:  Completed, signed off  If discussed at [employer] of Stay Meetings, dates discussed:    Additional Comments:  Marilu Favre, RN 04/03/2017, 12:11 PM

## 2017-04-03 NOTE — Progress Notes (Signed)
Discharge teaching complete. Meds, diet, activity, follow up appointments reviewed and all questions answered. Copy of instructions given to patient.  

## 2017-04-03 NOTE — Discharge Summary (Signed)
Physician Discharge Summary  Patient ID: [name] MRN: [id number] DOB/AGE: 04-Mar-1946 71 y.o.  Admit date: 04/01/2017 Discharge date: 04/03/2017  Admission Diagnoses: Principal Problem:   Ischemic colitis ([place]) Active Problems:   OBESITY   TOBACCO ABUSE   Essential hypertension   Chronic pain syndrome   Restless leg syndrome  Discharge Diagnoses:  Principal Problem:   Ischemic colitis ([place]) Active Problems:   OBESITY   TOBACCO ABUSE   Essential hypertension   Chronic pain syndrome   Restless leg syndrome   Discharged Condition: good  Hospital Course:  1.  Rectal bleeding/ ischemic colitis - pt presented with 2 days of bloody diarrhea, severe abd pain and gen'd weakness. CT abd didn't show any cause for abd pain.  Dr Cristina Gong for GI saw the patient and did an unprepped colon on her on 04/02/17 which showed results as below.  Felt to be ischemic colitis.  Hb was stable in 11-12 range.  No further bloody stool.  Biopsies taken, GI offered to see pt in F/U and pt agreed, the unit secretary is setting up a GI f/u appt.  NO further bleeding, plan DC home today.  No new medications, holding some of the BP meds at dc for 2-3 days then resume.   2.  AKI - creat 2.5 resolved down to 0.56 w/ IVF 3.  Chronic back pain 4.  Tobacco use 5.  HTN - was taking coreg / norvasc/ lotensin/ metoprolol at home, just getting norvasc here. BP's soft.  Hold other meds at dc but don't throw away, while likely need again 6.  Anxiety/ depression - lives alone and takes care of herself.      Consults: GI  Significant Diagnostic Studies: colonoscopy Results: A scattered area of moderately congested, inflamed and ulcerated mucosa       was found in the distal transverse colon. Biopsies were taken with a       cold forceps for histology. Estimated blood loss was minimal.      A segmental area of mildly congested, erythematous and granular mucosa       was found in the distal sigmoid colon.  Biopsies were taken with a cold       forceps for histology. Estimated blood loss was minimal.      Multiple medium-mouthed diverticula were found in the sigmoid colon.      A 4 mm polyp was found in the rectum (benign-appearing lesion). The       polyp was sessile. The polyp was removed with a cold snare. Resection       and retrieval were complete. Estimated blood loss was minimal.      The exam was otherwise normal throughout the examined colon. No blood       within the colonic lumen at this time.      The retroflexed view of the distal rectum and anal verge was normal and       showed no anal or rectal abnormalities. Impression:               - The overall picture is most consistent with                            mild-to-moderate, resolving ischemic colitis.                           - Congested, inflamed and ulcerated mucosa in the  distal transverse colon. Biopsied.                           - Congested, erythematous and granular mucosa in                            the distal sigmoid colon. Biopsied.                           - Diverticulosis in the sigmoid colon.                           - One benign appearing 4 mm polyp in the rectum,                            removed with a cold snare. Resected and retrieved.                           - The distal rectum and anal verge are normal on                            retroflexion view. Recommendation:           - Await pathology results.                           - Clear liquid diet today.                           - Continue present medications.                           - Repeat colonoscopy date will be determined after                            pathology results from today's exam become                            available for review.  Treatments: none  Discharge Exam: Blood pressure (!) 116/53, pulse 94, temperature 98.8 F (37.1 C), temperature source Oral, resp. rate 16, height 5\' 5"  (1.651 m),  weight 77.1 kg (170 lb), SpO2 94 %. Alert, no distress, calm No jvd Chest dec'd BS throughout, some mild crackles bilat bases, no wheezing RRR no mrg ABd soft, mild tenderness mid abd, +BS Neuro NF, anxious  Disposition: 01-Home or Self Care  Discharge Instructions    Discharge patient    Complete by:  As directed    Discharge disposition:  01-Home or Self Care   Discharge patient date:  04/03/2017     Allergies as of 04/03/2017      Reactions   Dust Mite Extract    Januvia [sitagliptin Phosphate] Nausea Only   Latex    Metformin Nausea And Vomiting   dizziness   Metoclopramide Hcl    Unknown    Penicillins    Has patient had a PCN reaction causing immediate rash, facial/tongue/throat swelling, SOB or lightheadedness with hypotension: Yes Has patient had a PCN reaction causing severe rash involving mucus membranes  or skin necrosis: No Has patient had a PCN reaction that required hospitalization No Has patient had a PCN reaction occurring within the last 10 years: Yes If all of the above answers are "NO", then may proceed with Cephalosporin use.   Sulfonamide Derivatives    REACTION: Reaction not known          Signed: Jasten Guyette D 04/03/2017, 10:49 AM

## 2017-04-03 NOTE — Progress Notes (Signed)
Patient discharged via stretcher with EMS.

## 2017-04-05 ENCOUNTER — Encounter (HOSPITAL_COMMUNITY): Payer: Self-pay | Admitting: Gastroenterology

## 2017-04-06 LAB — CULTURE, BLOOD (ROUTINE X 2)
CULTURE: NO GROWTH
CULTURE: NO GROWTH
Special Requests: ADEQUATE

## 2017-10-06 ENCOUNTER — Ambulatory Visit (HOSPITAL_COMMUNITY): Payer: Self-pay | Admitting: Psychiatry

## 2017-12-01 DEATH — deceased
